# Patient Record
Sex: Male | Born: 1940
Health system: Southern US, Community
[De-identification: ages and names within clinical notes are randomized; demographics above are authoritative.]

## PROBLEM LIST (undated history)

## (undated) DIAGNOSIS — I10 Essential (primary) hypertension: Secondary | ICD-10-CM

## (undated) DIAGNOSIS — J449 Chronic obstructive pulmonary disease, unspecified: Secondary | ICD-10-CM

---

## 2017-01-10 DIAGNOSIS — E038 Other specified hypothyroidism: Secondary | ICD-10-CM | POA: Diagnosis not present

## 2017-01-10 DIAGNOSIS — N400 Enlarged prostate without lower urinary tract symptoms: Secondary | ICD-10-CM | POA: Diagnosis not present

## 2017-01-10 DIAGNOSIS — Z0000 Encounter for general adult medical examination without abnormal findings: Secondary | ICD-10-CM | POA: Diagnosis not present

## 2017-01-10 DIAGNOSIS — E559 Vitamin D deficiency, unspecified: Secondary | ICD-10-CM | POA: Diagnosis not present

## 2017-01-10 DIAGNOSIS — E782 Mixed hyperlipidemia: Secondary | ICD-10-CM | POA: Diagnosis not present

## 2017-01-10 DIAGNOSIS — E119 Type 2 diabetes mellitus without complications: Secondary | ICD-10-CM | POA: Diagnosis not present

## 2017-01-10 DIAGNOSIS — I10 Essential (primary) hypertension: Secondary | ICD-10-CM | POA: Diagnosis not present

## 2017-01-10 DIAGNOSIS — Z79899 Other long term (current) drug therapy: Secondary | ICD-10-CM | POA: Diagnosis not present

## 2017-01-10 DIAGNOSIS — D518 Other vitamin B12 deficiency anemias: Secondary | ICD-10-CM | POA: Diagnosis not present

## 2017-01-10 DIAGNOSIS — J449 Chronic obstructive pulmonary disease, unspecified: Secondary | ICD-10-CM | POA: Diagnosis not present

## 2017-01-10 DIAGNOSIS — N509 Disorder of male genital organs, unspecified: Secondary | ICD-10-CM | POA: Diagnosis not present

## 2017-01-10 DIAGNOSIS — R011 Cardiac murmur, unspecified: Secondary | ICD-10-CM | POA: Diagnosis not present

## 2017-02-07 DIAGNOSIS — I10 Essential (primary) hypertension: Secondary | ICD-10-CM | POA: Diagnosis not present

## 2017-02-07 DIAGNOSIS — H25811 Combined forms of age-related cataract, right eye: Secondary | ICD-10-CM | POA: Diagnosis not present

## 2017-02-07 DIAGNOSIS — N400 Enlarged prostate without lower urinary tract symptoms: Secondary | ICD-10-CM | POA: Diagnosis not present

## 2017-02-07 DIAGNOSIS — H353131 Nonexudative age-related macular degeneration, bilateral, early dry stage: Secondary | ICD-10-CM | POA: Diagnosis not present

## 2017-02-07 DIAGNOSIS — J449 Chronic obstructive pulmonary disease, unspecified: Secondary | ICD-10-CM | POA: Diagnosis not present

## 2017-02-07 DIAGNOSIS — D518 Other vitamin B12 deficiency anemias: Secondary | ICD-10-CM | POA: Diagnosis not present

## 2017-03-07 DIAGNOSIS — Z0000 Encounter for general adult medical examination without abnormal findings: Secondary | ICD-10-CM | POA: Diagnosis not present

## 2017-03-07 DIAGNOSIS — D518 Other vitamin B12 deficiency anemias: Secondary | ICD-10-CM | POA: Diagnosis not present

## 2017-03-07 DIAGNOSIS — I10 Essential (primary) hypertension: Secondary | ICD-10-CM | POA: Diagnosis not present

## 2017-03-07 DIAGNOSIS — Z1211 Encounter for screening for malignant neoplasm of colon: Secondary | ICD-10-CM | POA: Diagnosis not present

## 2017-03-07 DIAGNOSIS — J449 Chronic obstructive pulmonary disease, unspecified: Secondary | ICD-10-CM | POA: Diagnosis not present

## 2017-03-07 DIAGNOSIS — N400 Enlarged prostate without lower urinary tract symptoms: Secondary | ICD-10-CM | POA: Diagnosis not present

## 2017-03-08 DIAGNOSIS — I10 Essential (primary) hypertension: Secondary | ICD-10-CM | POA: Diagnosis not present

## 2017-03-08 DIAGNOSIS — J449 Chronic obstructive pulmonary disease, unspecified: Secondary | ICD-10-CM | POA: Diagnosis not present

## 2017-03-08 DIAGNOSIS — D518 Other vitamin B12 deficiency anemias: Secondary | ICD-10-CM | POA: Diagnosis not present

## 2017-03-08 DIAGNOSIS — N400 Enlarged prostate without lower urinary tract symptoms: Secondary | ICD-10-CM | POA: Diagnosis not present

## 2017-03-29 DIAGNOSIS — I10 Essential (primary) hypertension: Secondary | ICD-10-CM | POA: Diagnosis not present

## 2017-03-29 DIAGNOSIS — J449 Chronic obstructive pulmonary disease, unspecified: Secondary | ICD-10-CM | POA: Diagnosis not present

## 2017-03-29 DIAGNOSIS — N400 Enlarged prostate without lower urinary tract symptoms: Secondary | ICD-10-CM | POA: Diagnosis not present

## 2017-03-29 DIAGNOSIS — D518 Other vitamin B12 deficiency anemias: Secondary | ICD-10-CM | POA: Diagnosis not present

## 2017-05-02 DIAGNOSIS — I10 Essential (primary) hypertension: Secondary | ICD-10-CM | POA: Diagnosis not present

## 2017-05-02 DIAGNOSIS — J449 Chronic obstructive pulmonary disease, unspecified: Secondary | ICD-10-CM | POA: Diagnosis not present

## 2017-05-02 DIAGNOSIS — N400 Enlarged prostate without lower urinary tract symptoms: Secondary | ICD-10-CM | POA: Diagnosis not present

## 2017-05-02 DIAGNOSIS — D518 Other vitamin B12 deficiency anemias: Secondary | ICD-10-CM | POA: Diagnosis not present

## 2017-06-04 DIAGNOSIS — D518 Other vitamin B12 deficiency anemias: Secondary | ICD-10-CM | POA: Diagnosis not present

## 2017-06-04 DIAGNOSIS — N400 Enlarged prostate without lower urinary tract symptoms: Secondary | ICD-10-CM | POA: Diagnosis not present

## 2017-06-04 DIAGNOSIS — J449 Chronic obstructive pulmonary disease, unspecified: Secondary | ICD-10-CM | POA: Diagnosis not present

## 2017-06-04 DIAGNOSIS — I10 Essential (primary) hypertension: Secondary | ICD-10-CM | POA: Diagnosis not present

## 2017-07-13 DIAGNOSIS — H269 Unspecified cataract: Secondary | ICD-10-CM | POA: Diagnosis not present

## 2017-07-13 DIAGNOSIS — H353133 Nonexudative age-related macular degeneration, bilateral, advanced atrophic without subfoveal involvement: Secondary | ICD-10-CM | POA: Diagnosis not present

## 2017-07-13 DIAGNOSIS — H40033 Anatomical narrow angle, bilateral: Secondary | ICD-10-CM | POA: Diagnosis not present

## 2017-07-24 DIAGNOSIS — H269 Unspecified cataract: Secondary | ICD-10-CM | POA: Diagnosis not present

## 2017-07-24 DIAGNOSIS — H25811 Combined forms of age-related cataract, right eye: Secondary | ICD-10-CM | POA: Diagnosis not present

## 2017-07-24 DIAGNOSIS — H2511 Age-related nuclear cataract, right eye: Secondary | ICD-10-CM | POA: Diagnosis not present

## 2017-08-02 DIAGNOSIS — J449 Chronic obstructive pulmonary disease, unspecified: Secondary | ICD-10-CM | POA: Diagnosis not present

## 2017-08-02 DIAGNOSIS — I10 Essential (primary) hypertension: Secondary | ICD-10-CM | POA: Diagnosis not present

## 2017-08-02 DIAGNOSIS — D518 Other vitamin B12 deficiency anemias: Secondary | ICD-10-CM | POA: Diagnosis not present

## 2017-08-02 DIAGNOSIS — N400 Enlarged prostate without lower urinary tract symptoms: Secondary | ICD-10-CM | POA: Diagnosis not present

## 2017-09-04 DIAGNOSIS — J449 Chronic obstructive pulmonary disease, unspecified: Secondary | ICD-10-CM | POA: Diagnosis not present

## 2017-09-04 DIAGNOSIS — I10 Essential (primary) hypertension: Secondary | ICD-10-CM | POA: Diagnosis not present

## 2017-09-04 DIAGNOSIS — D518 Other vitamin B12 deficiency anemias: Secondary | ICD-10-CM | POA: Diagnosis not present

## 2017-09-04 DIAGNOSIS — N400 Enlarged prostate without lower urinary tract symptoms: Secondary | ICD-10-CM | POA: Diagnosis not present

## 2017-10-02 DIAGNOSIS — N400 Enlarged prostate without lower urinary tract symptoms: Secondary | ICD-10-CM | POA: Diagnosis not present

## 2017-10-02 DIAGNOSIS — D518 Other vitamin B12 deficiency anemias: Secondary | ICD-10-CM | POA: Diagnosis not present

## 2017-10-02 DIAGNOSIS — I10 Essential (primary) hypertension: Secondary | ICD-10-CM | POA: Diagnosis not present

## 2017-10-02 DIAGNOSIS — J449 Chronic obstructive pulmonary disease, unspecified: Secondary | ICD-10-CM | POA: Diagnosis not present

## 2018-01-09 DIAGNOSIS — I10 Essential (primary) hypertension: Secondary | ICD-10-CM | POA: Diagnosis not present

## 2018-01-09 DIAGNOSIS — J449 Chronic obstructive pulmonary disease, unspecified: Secondary | ICD-10-CM | POA: Diagnosis not present

## 2018-01-09 DIAGNOSIS — D518 Other vitamin B12 deficiency anemias: Secondary | ICD-10-CM | POA: Diagnosis not present

## 2018-01-09 DIAGNOSIS — N400 Enlarged prostate without lower urinary tract symptoms: Secondary | ICD-10-CM | POA: Diagnosis not present

## 2018-02-04 DIAGNOSIS — N400 Enlarged prostate without lower urinary tract symptoms: Secondary | ICD-10-CM | POA: Diagnosis not present

## 2018-02-04 DIAGNOSIS — I10 Essential (primary) hypertension: Secondary | ICD-10-CM | POA: Diagnosis not present

## 2018-02-04 DIAGNOSIS — J449 Chronic obstructive pulmonary disease, unspecified: Secondary | ICD-10-CM | POA: Diagnosis not present

## 2018-02-04 DIAGNOSIS — D518 Other vitamin B12 deficiency anemias: Secondary | ICD-10-CM | POA: Diagnosis not present

## 2018-03-06 DIAGNOSIS — D518 Other vitamin B12 deficiency anemias: Secondary | ICD-10-CM | POA: Diagnosis not present

## 2018-03-06 DIAGNOSIS — N400 Enlarged prostate without lower urinary tract symptoms: Secondary | ICD-10-CM | POA: Diagnosis not present

## 2018-03-06 DIAGNOSIS — I10 Essential (primary) hypertension: Secondary | ICD-10-CM | POA: Diagnosis not present

## 2018-03-06 DIAGNOSIS — J449 Chronic obstructive pulmonary disease, unspecified: Secondary | ICD-10-CM | POA: Diagnosis not present

## 2018-04-04 DIAGNOSIS — G5620 Lesion of ulnar nerve, unspecified upper limb: Secondary | ICD-10-CM | POA: Diagnosis not present

## 2018-04-04 DIAGNOSIS — Z9181 History of falling: Secondary | ICD-10-CM | POA: Diagnosis not present

## 2018-04-04 DIAGNOSIS — I10 Essential (primary) hypertension: Secondary | ICD-10-CM | POA: Diagnosis not present

## 2018-04-04 DIAGNOSIS — Z6827 Body mass index (BMI) 27.0-27.9, adult: Secondary | ICD-10-CM | POA: Diagnosis not present

## 2018-04-04 DIAGNOSIS — R5382 Chronic fatigue, unspecified: Secondary | ICD-10-CM | POA: Diagnosis not present

## 2018-04-12 DIAGNOSIS — Z6827 Body mass index (BMI) 27.0-27.9, adult: Secondary | ICD-10-CM | POA: Diagnosis not present

## 2018-04-12 DIAGNOSIS — I10 Essential (primary) hypertension: Secondary | ICD-10-CM | POA: Diagnosis not present

## 2018-04-12 DIAGNOSIS — R5382 Chronic fatigue, unspecified: Secondary | ICD-10-CM | POA: Diagnosis not present

## 2018-04-12 DIAGNOSIS — G5620 Lesion of ulnar nerve, unspecified upper limb: Secondary | ICD-10-CM | POA: Diagnosis not present

## 2018-04-26 DIAGNOSIS — Z6827 Body mass index (BMI) 27.0-27.9, adult: Secondary | ICD-10-CM | POA: Diagnosis not present

## 2018-04-26 DIAGNOSIS — R5382 Chronic fatigue, unspecified: Secondary | ICD-10-CM | POA: Diagnosis not present

## 2018-04-26 DIAGNOSIS — G5620 Lesion of ulnar nerve, unspecified upper limb: Secondary | ICD-10-CM | POA: Diagnosis not present

## 2018-04-26 DIAGNOSIS — I10 Essential (primary) hypertension: Secondary | ICD-10-CM | POA: Diagnosis not present

## 2018-05-10 DIAGNOSIS — E785 Hyperlipidemia, unspecified: Secondary | ICD-10-CM | POA: Diagnosis not present

## 2018-05-10 DIAGNOSIS — I10 Essential (primary) hypertension: Secondary | ICD-10-CM | POA: Diagnosis not present

## 2018-05-10 DIAGNOSIS — G5620 Lesion of ulnar nerve, unspecified upper limb: Secondary | ICD-10-CM | POA: Diagnosis not present

## 2018-05-10 DIAGNOSIS — Z6826 Body mass index (BMI) 26.0-26.9, adult: Secondary | ICD-10-CM | POA: Diagnosis not present

## 2018-05-10 DIAGNOSIS — R5382 Chronic fatigue, unspecified: Secondary | ICD-10-CM | POA: Diagnosis not present

## 2018-06-07 DIAGNOSIS — G5620 Lesion of ulnar nerve, unspecified upper limb: Secondary | ICD-10-CM | POA: Diagnosis not present

## 2018-06-07 DIAGNOSIS — I10 Essential (primary) hypertension: Secondary | ICD-10-CM | POA: Diagnosis not present

## 2018-06-07 DIAGNOSIS — Z6826 Body mass index (BMI) 26.0-26.9, adult: Secondary | ICD-10-CM | POA: Diagnosis not present

## 2018-06-07 DIAGNOSIS — E785 Hyperlipidemia, unspecified: Secondary | ICD-10-CM | POA: Diagnosis not present

## 2018-06-07 DIAGNOSIS — R5382 Chronic fatigue, unspecified: Secondary | ICD-10-CM | POA: Diagnosis not present

## 2018-07-05 DIAGNOSIS — E785 Hyperlipidemia, unspecified: Secondary | ICD-10-CM | POA: Diagnosis not present

## 2018-07-05 DIAGNOSIS — G5620 Lesion of ulnar nerve, unspecified upper limb: Secondary | ICD-10-CM | POA: Diagnosis not present

## 2018-07-05 DIAGNOSIS — R5382 Chronic fatigue, unspecified: Secondary | ICD-10-CM | POA: Diagnosis not present

## 2018-07-05 DIAGNOSIS — Z6826 Body mass index (BMI) 26.0-26.9, adult: Secondary | ICD-10-CM | POA: Diagnosis not present

## 2018-07-05 DIAGNOSIS — I10 Essential (primary) hypertension: Secondary | ICD-10-CM | POA: Diagnosis not present

## 2018-08-06 DIAGNOSIS — Z125 Encounter for screening for malignant neoplasm of prostate: Secondary | ICD-10-CM | POA: Diagnosis not present

## 2018-08-06 DIAGNOSIS — Z0000 Encounter for general adult medical examination without abnormal findings: Secondary | ICD-10-CM | POA: Diagnosis not present

## 2018-08-06 DIAGNOSIS — Z6827 Body mass index (BMI) 27.0-27.9, adult: Secondary | ICD-10-CM | POA: Diagnosis not present

## 2018-08-06 DIAGNOSIS — I10 Essential (primary) hypertension: Secondary | ICD-10-CM | POA: Diagnosis not present

## 2018-08-06 DIAGNOSIS — G5620 Lesion of ulnar nerve, unspecified upper limb: Secondary | ICD-10-CM | POA: Diagnosis not present

## 2018-08-06 DIAGNOSIS — R5382 Chronic fatigue, unspecified: Secondary | ICD-10-CM | POA: Diagnosis not present

## 2018-08-06 DIAGNOSIS — Z1339 Encounter for screening examination for other mental health and behavioral disorders: Secondary | ICD-10-CM | POA: Diagnosis not present

## 2018-08-06 DIAGNOSIS — Z9181 History of falling: Secondary | ICD-10-CM | POA: Diagnosis not present

## 2018-08-06 DIAGNOSIS — E785 Hyperlipidemia, unspecified: Secondary | ICD-10-CM | POA: Diagnosis not present

## 2018-08-06 DIAGNOSIS — Z139 Encounter for screening, unspecified: Secondary | ICD-10-CM | POA: Diagnosis not present

## 2018-09-06 DIAGNOSIS — E785 Hyperlipidemia, unspecified: Secondary | ICD-10-CM | POA: Diagnosis not present

## 2018-09-06 DIAGNOSIS — I10 Essential (primary) hypertension: Secondary | ICD-10-CM | POA: Diagnosis not present

## 2018-09-06 DIAGNOSIS — Z6827 Body mass index (BMI) 27.0-27.9, adult: Secondary | ICD-10-CM | POA: Diagnosis not present

## 2018-09-06 DIAGNOSIS — G5620 Lesion of ulnar nerve, unspecified upper limb: Secondary | ICD-10-CM | POA: Diagnosis not present

## 2018-09-06 DIAGNOSIS — R5382 Chronic fatigue, unspecified: Secondary | ICD-10-CM | POA: Diagnosis not present

## 2018-10-07 DIAGNOSIS — R5382 Chronic fatigue, unspecified: Secondary | ICD-10-CM | POA: Diagnosis not present

## 2018-10-07 DIAGNOSIS — G5620 Lesion of ulnar nerve, unspecified upper limb: Secondary | ICD-10-CM | POA: Diagnosis not present

## 2018-10-07 DIAGNOSIS — E785 Hyperlipidemia, unspecified: Secondary | ICD-10-CM | POA: Diagnosis not present

## 2018-10-07 DIAGNOSIS — E663 Overweight: Secondary | ICD-10-CM | POA: Diagnosis not present

## 2018-10-07 DIAGNOSIS — Z6828 Body mass index (BMI) 28.0-28.9, adult: Secondary | ICD-10-CM | POA: Diagnosis not present

## 2018-11-20 DIAGNOSIS — R5382 Chronic fatigue, unspecified: Secondary | ICD-10-CM | POA: Diagnosis not present

## 2018-11-20 DIAGNOSIS — E663 Overweight: Secondary | ICD-10-CM | POA: Diagnosis not present

## 2018-11-20 DIAGNOSIS — G5620 Lesion of ulnar nerve, unspecified upper limb: Secondary | ICD-10-CM | POA: Diagnosis not present

## 2018-11-20 DIAGNOSIS — E785 Hyperlipidemia, unspecified: Secondary | ICD-10-CM | POA: Diagnosis not present

## 2018-11-20 DIAGNOSIS — I10 Essential (primary) hypertension: Secondary | ICD-10-CM | POA: Diagnosis not present

## 2018-11-20 DIAGNOSIS — Z6827 Body mass index (BMI) 27.0-27.9, adult: Secondary | ICD-10-CM | POA: Diagnosis not present

## 2018-12-20 DIAGNOSIS — I10 Essential (primary) hypertension: Secondary | ICD-10-CM | POA: Diagnosis not present

## 2018-12-20 DIAGNOSIS — J449 Chronic obstructive pulmonary disease, unspecified: Secondary | ICD-10-CM | POA: Diagnosis not present

## 2018-12-23 DIAGNOSIS — R5382 Chronic fatigue, unspecified: Secondary | ICD-10-CM | POA: Diagnosis not present

## 2018-12-23 DIAGNOSIS — G5620 Lesion of ulnar nerve, unspecified upper limb: Secondary | ICD-10-CM | POA: Diagnosis not present

## 2018-12-23 DIAGNOSIS — I10 Essential (primary) hypertension: Secondary | ICD-10-CM | POA: Diagnosis not present

## 2018-12-23 DIAGNOSIS — E785 Hyperlipidemia, unspecified: Secondary | ICD-10-CM | POA: Diagnosis not present

## 2018-12-30 DIAGNOSIS — E785 Hyperlipidemia, unspecified: Secondary | ICD-10-CM | POA: Diagnosis not present

## 2018-12-30 DIAGNOSIS — G5620 Lesion of ulnar nerve, unspecified upper limb: Secondary | ICD-10-CM | POA: Diagnosis not present

## 2018-12-30 DIAGNOSIS — R5382 Chronic fatigue, unspecified: Secondary | ICD-10-CM | POA: Diagnosis not present

## 2018-12-30 DIAGNOSIS — I10 Essential (primary) hypertension: Secondary | ICD-10-CM | POA: Diagnosis not present

## 2019-01-08 DIAGNOSIS — H2703 Aphakia, bilateral: Secondary | ICD-10-CM | POA: Diagnosis not present

## 2019-01-27 DIAGNOSIS — J028 Acute pharyngitis due to other specified organisms: Secondary | ICD-10-CM | POA: Diagnosis not present

## 2019-01-27 DIAGNOSIS — R5382 Chronic fatigue, unspecified: Secondary | ICD-10-CM | POA: Diagnosis not present

## 2019-01-27 DIAGNOSIS — E785 Hyperlipidemia, unspecified: Secondary | ICD-10-CM | POA: Diagnosis not present

## 2019-01-27 DIAGNOSIS — G5620 Lesion of ulnar nerve, unspecified upper limb: Secondary | ICD-10-CM | POA: Diagnosis not present

## 2019-01-27 DIAGNOSIS — I10 Essential (primary) hypertension: Secondary | ICD-10-CM | POA: Diagnosis not present

## 2019-02-25 DIAGNOSIS — E785 Hyperlipidemia, unspecified: Secondary | ICD-10-CM | POA: Diagnosis not present

## 2019-02-25 DIAGNOSIS — I10 Essential (primary) hypertension: Secondary | ICD-10-CM | POA: Diagnosis not present

## 2019-02-25 DIAGNOSIS — G5620 Lesion of ulnar nerve, unspecified upper limb: Secondary | ICD-10-CM | POA: Diagnosis not present

## 2019-02-25 DIAGNOSIS — R5382 Chronic fatigue, unspecified: Secondary | ICD-10-CM | POA: Diagnosis not present

## 2019-03-05 DIAGNOSIS — I10 Essential (primary) hypertension: Secondary | ICD-10-CM | POA: Diagnosis not present

## 2019-03-05 DIAGNOSIS — E785 Hyperlipidemia, unspecified: Secondary | ICD-10-CM | POA: Diagnosis not present

## 2019-03-05 DIAGNOSIS — G5620 Lesion of ulnar nerve, unspecified upper limb: Secondary | ICD-10-CM | POA: Diagnosis not present

## 2019-03-05 DIAGNOSIS — R5382 Chronic fatigue, unspecified: Secondary | ICD-10-CM | POA: Diagnosis not present

## 2019-03-31 DIAGNOSIS — G5620 Lesion of ulnar nerve, unspecified upper limb: Secondary | ICD-10-CM | POA: Diagnosis not present

## 2019-03-31 DIAGNOSIS — E785 Hyperlipidemia, unspecified: Secondary | ICD-10-CM | POA: Diagnosis not present

## 2019-03-31 DIAGNOSIS — R5382 Chronic fatigue, unspecified: Secondary | ICD-10-CM | POA: Diagnosis not present

## 2019-03-31 DIAGNOSIS — I10 Essential (primary) hypertension: Secondary | ICD-10-CM | POA: Diagnosis not present

## 2019-04-28 DIAGNOSIS — E785 Hyperlipidemia, unspecified: Secondary | ICD-10-CM | POA: Diagnosis not present

## 2019-04-28 DIAGNOSIS — G5620 Lesion of ulnar nerve, unspecified upper limb: Secondary | ICD-10-CM | POA: Diagnosis not present

## 2019-04-28 DIAGNOSIS — I10 Essential (primary) hypertension: Secondary | ICD-10-CM | POA: Diagnosis not present

## 2019-04-28 DIAGNOSIS — R5382 Chronic fatigue, unspecified: Secondary | ICD-10-CM | POA: Diagnosis not present

## 2019-05-26 DIAGNOSIS — E785 Hyperlipidemia, unspecified: Secondary | ICD-10-CM | POA: Diagnosis not present

## 2019-05-26 DIAGNOSIS — I10 Essential (primary) hypertension: Secondary | ICD-10-CM | POA: Diagnosis not present

## 2019-05-26 DIAGNOSIS — R5382 Chronic fatigue, unspecified: Secondary | ICD-10-CM | POA: Diagnosis not present

## 2019-05-26 DIAGNOSIS — G5620 Lesion of ulnar nerve, unspecified upper limb: Secondary | ICD-10-CM | POA: Diagnosis not present

## 2019-07-03 ENCOUNTER — Other Ambulatory Visit (HOSPITAL_COMMUNITY): Admit: 2019-07-03 | Discharge: 2019-07-03 | Disposition: A | Discharge status: Home / Self Care | Payer: Medicare Other

## 2019-07-03 DIAGNOSIS — Z5329 Procedure and treatment not carried out because of patient's decision for other reasons: Secondary | ICD-10-CM | POA: Diagnosis not present

## 2019-07-03 DIAGNOSIS — I10 Essential (primary) hypertension: Secondary | ICD-10-CM | POA: Diagnosis not present

## 2019-07-03 DIAGNOSIS — R0902 Hypoxemia: Secondary | ICD-10-CM | POA: Diagnosis not present

## 2019-07-03 DIAGNOSIS — J439 Emphysema, unspecified: Secondary | ICD-10-CM | POA: Diagnosis not present

## 2019-07-03 DIAGNOSIS — Z7982 Long term (current) use of aspirin: Secondary | ICD-10-CM | POA: Diagnosis not present

## 2019-07-03 DIAGNOSIS — Z87891 Personal history of nicotine dependence: Secondary | ICD-10-CM | POA: Diagnosis not present

## 2019-07-03 DIAGNOSIS — R0601 Orthopnea: Secondary | ICD-10-CM | POA: Diagnosis not present

## 2019-07-03 DIAGNOSIS — J441 Chronic obstructive pulmonary disease with (acute) exacerbation: Secondary | ICD-10-CM | POA: Diagnosis not present

## 2019-07-03 DIAGNOSIS — M1990 Unspecified osteoarthritis, unspecified site: Secondary | ICD-10-CM | POA: Diagnosis not present

## 2019-07-03 DIAGNOSIS — R600 Localized edema: Secondary | ICD-10-CM | POA: Diagnosis not present

## 2019-07-03 DIAGNOSIS — R0602 Shortness of breath: Secondary | ICD-10-CM | POA: Diagnosis not present

## 2019-07-04 DIAGNOSIS — J439 Emphysema, unspecified: Secondary | ICD-10-CM | POA: Diagnosis not present

## 2019-07-04 DIAGNOSIS — M1990 Unspecified osteoarthritis, unspecified site: Secondary | ICD-10-CM | POA: Diagnosis not present

## 2019-07-04 DIAGNOSIS — Z87891 Personal history of nicotine dependence: Secondary | ICD-10-CM | POA: Diagnosis not present

## 2019-07-04 DIAGNOSIS — R0601 Orthopnea: Secondary | ICD-10-CM | POA: Diagnosis not present

## 2019-07-04 DIAGNOSIS — I10 Essential (primary) hypertension: Secondary | ICD-10-CM | POA: Diagnosis not present

## 2019-07-04 DIAGNOSIS — R0602 Shortness of breath: Secondary | ICD-10-CM | POA: Diagnosis not present

## 2019-07-04 DIAGNOSIS — R600 Localized edema: Secondary | ICD-10-CM | POA: Diagnosis not present

## 2019-07-04 DIAGNOSIS — Z7982 Long term (current) use of aspirin: Secondary | ICD-10-CM | POA: Diagnosis not present

## 2019-07-04 DIAGNOSIS — R0902 Hypoxemia: Secondary | ICD-10-CM | POA: Diagnosis not present

## 2019-07-04 DIAGNOSIS — J441 Chronic obstructive pulmonary disease with (acute) exacerbation: Secondary | ICD-10-CM | POA: Diagnosis not present

## 2019-07-04 DIAGNOSIS — Z5329 Procedure and treatment not carried out because of patient's decision for other reasons: Secondary | ICD-10-CM | POA: Diagnosis not present

## 2019-07-04 LAB — SARS CORONAVIRUS 2 BY RT PCR (HOSPITAL ORDER, PERFORMED IN ~~LOC~~ HOSPITAL LAB): SARS Coronavirus 2: NEGATIVE

## 2019-07-14 ENCOUNTER — Encounter (HOSPITAL_COMMUNITY): Payer: Self-pay

## 2019-07-14 ENCOUNTER — Other Ambulatory Visit: Payer: Self-pay

## 2019-07-14 ENCOUNTER — Emergency Department (HOSPITAL_COMMUNITY)
Admission: EM | Admit: 2019-07-14 | Discharge: 2019-07-14 | Disposition: A | Discharge status: Home / Self Care | Payer: Medicare Other | Attending: Emergency Medicine | Admitting: Emergency Medicine

## 2019-07-14 ENCOUNTER — Emergency Department (HOSPITAL_COMMUNITY): Payer: Medicare Other

## 2019-07-14 DIAGNOSIS — Z87891 Personal history of nicotine dependence: Secondary | ICD-10-CM | POA: Diagnosis not present

## 2019-07-14 DIAGNOSIS — I872 Venous insufficiency (chronic) (peripheral): Secondary | ICD-10-CM | POA: Diagnosis not present

## 2019-07-14 DIAGNOSIS — D649 Anemia, unspecified: Secondary | ICD-10-CM

## 2019-07-14 DIAGNOSIS — R600 Localized edema: Secondary | ICD-10-CM | POA: Diagnosis not present

## 2019-07-14 DIAGNOSIS — I10 Essential (primary) hypertension: Secondary | ICD-10-CM | POA: Insufficient documentation

## 2019-07-14 DIAGNOSIS — R609 Edema, unspecified: Secondary | ICD-10-CM

## 2019-07-14 DIAGNOSIS — R0602 Shortness of breath: Secondary | ICD-10-CM | POA: Insufficient documentation

## 2019-07-14 DIAGNOSIS — R2243 Localized swelling, mass and lump, lower limb, bilateral: Secondary | ICD-10-CM | POA: Insufficient documentation

## 2019-07-14 DIAGNOSIS — J449 Chronic obstructive pulmonary disease, unspecified: Secondary | ICD-10-CM | POA: Diagnosis not present

## 2019-07-14 HISTORY — DX: Chronic obstructive pulmonary disease, unspecified: J44.9

## 2019-07-14 HISTORY — DX: Essential (primary) hypertension: I10

## 2019-07-14 LAB — BASIC METABOLIC PANEL
Anion gap: 8 (ref 5–15)
BUN: 8 mg/dL (ref 8–23)
CO2: 32 mmol/L (ref 22–32)
Calcium: 9.2 mg/dL (ref 8.9–10.3)
Chloride: 100 mmol/L (ref 98–111)
Creatinine, Ser: 0.97 mg/dL (ref 0.61–1.24)
GFR calc Af Amer: >60 mL/min (ref >60)
GFR calc non Af Amer: >60 mL/min (ref >60)
Glucose, Bld: 103 mg/dL — ABNORMAL HIGH (ref 70–99)
Potassium: 4.2 mmol/L (ref 3.5–5.1)
Sodium: 140 mmol/L (ref 135–145)

## 2019-07-14 LAB — CBC
HCT: 40.5 % (ref 39.0–52.0)
Hemoglobin: 12.3 g/dL — ABNORMAL LOW (ref 13.0–17.0)
MCH: 29.1 pg (ref 26.0–34.0)
MCHC: 30.4 g/dL (ref 30.0–36.0)
MCV: 95.7 fL (ref 80.0–100.0)
Platelets: 197 10*3/uL (ref 150–400)
RBC: 4.23 MIL/uL (ref 4.22–5.81)
RDW: 14.7 % (ref 11.5–15.5)
WBC: 6.2 10*3/uL (ref 4.0–10.5)
nRBC: 0 % (ref 0.0–0.2)

## 2019-07-14 LAB — TROPONIN I (HIGH SENSITIVITY): Troponin I (High Sensitivity): 7 ng/L (ref <18)

## 2019-07-14 LAB — BRAIN NATRIURETIC PEPTIDE: B Natriuretic Peptide: 32.5 pg/mL (ref 0.0–100.0)

## 2019-07-14 MED ORDER — FUROSEMIDE 40 MG PO TABS: 40.0000 mg | ORAL_TABLET | Freq: Every day | ORAL | 0 refills | Status: DC

## 2019-07-14 MED ORDER — FUROSEMIDE 20 MG PO TABS
40.0000 mg | ORAL_TABLET | Freq: Once | ORAL | Status: AC
  Administered 2019-07-14: 40 mg via ORAL
  Filled 2019-07-14: qty 2

## 2019-07-14 NOTE — ED Provider Notes (Signed)
[facility] Provider Note   CSN: [id number] Arrival date & time: 07/14/19  1755    History   Chief Complaint Chief Complaint  Patient presents with  . Leg Swelling  . Shortness of Breath    HPI Aaron Daniels is a 78 y.o. male.    The history is provided by the patient.  He has history of COPD and hypertension and comes in because of progressive leg swelling over the last 3 weeks.  [place] had been [place] in [place] but left AMA.  He states that they gave him some medicine and he peed out at least 3 gallons.  He does note some exertional dyspnea which has been going on for several months.  He denies chest pain, heaviness, tightness, pressure.  He denies nausea or vomiting.  He does admit to having large salt intake.  Past Medical History:  Diagnosis Date  . COPD (chronic obstructive pulmonary disease) (HCC)   . Hypertension     There are no active problems to display for this patient.   History reviewed. No pertinent surgical history.      Home Medications    Prior to Admission medications   Not on File    Family History History reviewed. No pertinent family history.  Social History Social History   Tobacco Use  . Smoking status: Former Smoker    Quit date: 07/13/2012    Years since quitting: 7.0  Substance Use Topics  . Alcohol use: Never    Frequency: Never  . Drug use: Never     Allergies   Codeine, Penicillins, and Sulfa antibiotics   Review of Systems Review of Systems  All other systems reviewed and are negative.    Physical Exam Updated Vital Signs BP (!) 150/95   Pulse 87   Temp 98.4 F (36.9 C) (Oral)   Resp 19   SpO2 94%   Physical Exam Vitals signs and nursing note reviewed.    78 year old male, resting comfortably and in no acute distress. Vital signs are significant for elevated blood pressure. Oxygen saturation is 94%, which is normal. Head is normocephalic and atraumatic. PERRLA,  EOMI. Oropharynx is clear. Neck is nontender and supple without adenopathy or JVD. Back is nontender and there is no CVA tenderness. Lungs have few bibasilar rales without wheezes or rhonchi. Chest is nontender. Heart has regular rate and rhythm without murmur. Abdomen is soft, flat, nontender without masses or hepatosplenomegaly and peristalsis is normoactive. Extremities have 2-3+ edema, full range of motion is present.  Moderate venous stasis changes are present bilaterally. Skin is warm and dry without rash. Neurologic: Mental status is normal, cranial nerves are intact, there are no motor or sensory deficits.  ED Treatments / Results  Labs (all labs ordered are listed, but only abnormal results are displayed) Labs Reviewed  BASIC METABOLIC PANEL - Abnormal; Notable for the following components:      Result Value   Glucose, Bld 103 (*)    All other components within normal limits  CBC - Abnormal; Notable for the following components:   Hemoglobin 12.3 (*)    All other components within normal limits  BRAIN NATRIURETIC PEPTIDE  TROPONIN I (HIGH SENSITIVITY)    EKG EKG Interpretation  Date/Time:  Monday July 14 2019 18:15:20 EDT Ventricular Rate:  86 PR Interval:  152 QRS Duration: 74 QT Interval:  362 QTC Calculation: 433 R Axis:   -47 Text Interpretation:  Normal sinus rhythm Left anterior fascicular  block Cannot rule out Inferior infarct (masked by fascicular block?) , age undetermined Abnormal ECG No old tracing to compare Confirmed by Delora Fuel ([id number]) on 07/14/2019 10:55:50 PM   Radiology Dg Chest 2 View  Result Date: 07/14/2019 CLINICAL DATA:  Shortness of breath for 2 weeks EXAM: CHEST - 2 VIEW COMPARISON:  07/03/2019 FINDINGS: Cardiac shadow is stable. The lungs are mildly hyperaerated bilaterally. No focal infiltrate or sizable effusion is seen. Mild chronic appearing interstitial changes are again identified and stable. No acute bony abnormality is noted.  IMPRESSION: COPD without acute abnormality Electronically Signed   By: Inez Catalina M.D.   On: 07/14/2019 19:01    Procedures Procedures  Medications Ordered in ED Medications  furosemide (LASIX) tablet 40 mg (has no administration in time range)     Initial Impression / Assessment and Plan / ED Course  I have reviewed the triage vital signs and the nursing notes.  Pertinent labs & imaging results that were available during my care of the patient were reviewed by me and considered in my medical decision making (see chart for details).  Peripheral edema with venous stasis changes.  History of COPD.  ECG shows left anterior fascicular block without any prior ECGs available for comparison.  Chest x-ray shows changes of COPD, no changes suggestive of heart failure.  Labs are significant for mild anemia.  Old records are reviewed, and the only thing on record is an urgent care visit 1 week ago for similar complaints and he was referred to the hospital at that time.  He is currently maintaining adequate oxygen saturation and does not need inpatient care.  He is discharged with a prescription for furosemide, advised to stay on a low-salt diet and to weigh himself daily.  He has a an appointment with a new PCP scheduled for August 20 and he is to keep that appointment.  Return precautions discussed.  Final Clinical Impressions(s) / ED Diagnoses   Final diagnoses:  None    ED Discharge Orders    None       Delora Fuel, MD 51/76/16 2321

## 2019-07-14 NOTE — Discharge Instructions (Signed)
Weigh yourself every morning.  Stay on a low salt diet.  Return if you are having any problems.

## 2019-07-14 NOTE — ED Triage Notes (Signed)
Pt endorses generalized swelling with shob x 2 weeks, recently diagnosed with copd. Axox4. Denies CP.

## 2019-08-13 ENCOUNTER — Emergency Department (HOSPITAL_COMMUNITY)
Admission: EM | Admit: 2019-08-13 | Discharge: 2019-08-14 | Disposition: A | Discharge status: Home / Self Care | Payer: Medicare Other | Attending: Emergency Medicine | Admitting: Emergency Medicine

## 2019-08-13 ENCOUNTER — Other Ambulatory Visit: Payer: Self-pay

## 2019-08-13 DIAGNOSIS — I10 Essential (primary) hypertension: Secondary | ICD-10-CM | POA: Diagnosis not present

## 2019-08-13 DIAGNOSIS — J441 Chronic obstructive pulmonary disease with (acute) exacerbation: Secondary | ICD-10-CM | POA: Diagnosis not present

## 2019-08-13 DIAGNOSIS — R062 Wheezing: Secondary | ICD-10-CM | POA: Diagnosis not present

## 2019-08-13 DIAGNOSIS — Z79899 Other long term (current) drug therapy: Secondary | ICD-10-CM | POA: Diagnosis not present

## 2019-08-13 DIAGNOSIS — R0602 Shortness of breath: Secondary | ICD-10-CM | POA: Diagnosis not present

## 2019-08-13 DIAGNOSIS — R600 Localized edema: Secondary | ICD-10-CM | POA: Insufficient documentation

## 2019-08-13 DIAGNOSIS — Z87891 Personal history of nicotine dependence: Secondary | ICD-10-CM | POA: Insufficient documentation

## 2019-08-13 DIAGNOSIS — J42 Unspecified chronic bronchitis: Secondary | ICD-10-CM | POA: Diagnosis not present

## 2019-08-13 LAB — CBC
HCT: 41.6 % (ref 39.0–52.0)
Hemoglobin: 12.5 g/dL — ABNORMAL LOW (ref 13.0–17.0)
MCH: 29 pg (ref 26.0–34.0)
MCHC: 30 g/dL (ref 30.0–36.0)
MCV: 96.5 fL (ref 80.0–100.0)
Platelets: 213 10*3/uL (ref 150–400)
RBC: 4.31 MIL/uL (ref 4.22–5.81)
RDW: 14.5 % (ref 11.5–15.5)
WBC: 6 10*3/uL (ref 4.0–10.5)
nRBC: 0 % (ref 0.0–0.2)

## 2019-08-13 LAB — BASIC METABOLIC PANEL
Anion gap: 10 (ref 5–15)
BUN: 16 mg/dL (ref 8–23)
CO2: 32 mmol/L (ref 22–32)
Calcium: 8.9 mg/dL (ref 8.9–10.3)
Chloride: 97 mmol/L — ABNORMAL LOW (ref 98–111)
Creatinine, Ser: 1 mg/dL (ref 0.61–1.24)
GFR calc Af Amer: >60 mL/min (ref >60)
GFR calc non Af Amer: >60 mL/min (ref >60)
Glucose, Bld: 131 mg/dL — ABNORMAL HIGH (ref 70–99)
Potassium: 4.1 mmol/L (ref 3.5–5.1)
Sodium: 139 mmol/L (ref 135–145)

## 2019-08-13 MED ORDER — ALBUTEROL SULFATE (2.5 MG/3ML) 0.083% IN NEBU
5.0000 mg | INHALATION_SOLUTION | Freq: Once | RESPIRATORY_TRACT | Status: AC
  Administered 2019-08-13: 5 mg via RESPIRATORY_TRACT
  Filled 2019-08-13: qty 6

## 2019-08-13 NOTE — ED Triage Notes (Signed)
Usual shortness of breath is worsening, interfering with daily activities.  Edema is worsening.  Talking in complete sentences, can hear audible wheezes.

## 2019-08-14 ENCOUNTER — Emergency Department (HOSPITAL_COMMUNITY): Payer: Medicare Other

## 2019-08-14 DIAGNOSIS — R0602 Shortness of breath: Secondary | ICD-10-CM | POA: Diagnosis not present

## 2019-08-14 LAB — BRAIN NATRIURETIC PEPTIDE: B Natriuretic Peptide: 32.1 pg/mL (ref 0.0–100.0)

## 2019-08-14 MED ORDER — FUROSEMIDE 10 MG/ML IJ SOLN
40.0000 mg | Freq: Once | INTRAMUSCULAR | Status: AC
  Administered 2019-08-14: 40 mg via INTRAVENOUS
  Filled 2019-08-14: qty 4

## 2019-08-14 MED ORDER — ALBUTEROL SULFATE HFA 108 (90 BASE) MCG/ACT IN AERS
4.0000 | INHALATION_SPRAY | RESPIRATORY_TRACT | Status: AC
  Administered 2019-08-14 (×3): 4 via RESPIRATORY_TRACT
  Filled 2019-08-14: qty 6.7

## 2019-08-14 MED ORDER — PREDNISONE 20 MG PO TABS: ORAL_TABLET | ORAL | 0 refills | Status: DC

## 2019-08-14 MED ORDER — METHYLPREDNISOLONE SODIUM SUCC 125 MG IJ SOLR
125.0000 mg | Freq: Once | INTRAMUSCULAR | Status: AC
  Administered 2019-08-14: 125 mg via INTRAVENOUS
  Filled 2019-08-14: qty 2

## 2019-08-14 NOTE — ED Notes (Signed)
Lab to at [facility] to previous lab collection

## 2019-08-14 NOTE — Discharge Instructions (Addendum)
Take prednisone as prescribed   Use albuterol every 4 hrs as needed for cough   See your doctor  Return to ER if you have worse shortness of breath, wheezing, fever, cough

## 2019-08-14 NOTE — ED Notes (Signed)
Pt O2 tank switched out for a fresh tank. Pt O2 tank was empty.

## 2019-08-14 NOTE — ED Provider Notes (Addendum)
[facility]   CSN: [id number] Arrival date & time: 08/13/19  2103     History   Chief Complaint Chief Complaint  Patient presents with   Shortness of Breath    HPI Aaron Daniels is a 78 y.o. male.     HPI  78 year old comes with a chief complaint of shortness of breath and worsening leg swelling. Patient reports that he just established a PCP today.  They advised that he come to the ER for his symptoms.  He has been having worsening shortness of breath over the past several months.  Leg swelling is also been going on for several weeks.  Patient is on Lasix.  He reports that now walking around the house or going to the mailbox gets him short of breath, which is new for him.  He does not have any known diagnosis of CHF or CAD.  He denies any chest pain.  He has a cough and is wheezing.  Cough is mostly nonproductive.  He denies any orthopnea or paroxysmal nocturnal dyspnea-like symptoms.  There is bilateral lower extremity swelling that continues to be a source of discomfort for him. Patient used to smoke 1 pack a day, but has not smoked for over 7 years now.  He is not on any inhalers at home. Past Medical History:  Diagnosis Date   COPD (chronic obstructive pulmonary disease) (HCC)    Hypertension     There are no active problems to display for this patient.   No past surgical history on file.      Home Medications    Prior to Admission medications   Medication Sig Start Date End Date Taking? Authorizing Provider  furosemide (LASIX) 40 MG tablet Take 1 tablet (40 mg total) by mouth daily. 07/14/19   Dione BoozeGlick, David, MD    Family History No family history on file.  Social History Social History   Tobacco Use   Smoking status: Former Smoker    Quit date: 07/13/2012    Years since quitting: 7.0  Substance Use Topics   Alcohol use: Never    Frequency: Never   Drug use: Never     Allergies   Codeine,  Penicillins, and Sulfa antibiotics   Review of Systems Review of Systems  Constitutional: Positive for activity change.  Respiratory: Positive for cough, shortness of breath and wheezing.   Cardiovascular: Negative for chest pain.  Gastrointestinal: Negative for nausea and vomiting.  All other systems reviewed and are negative.    Physical Exam Updated Vital Signs BP (!) 135/92    Pulse 96    Temp 98.9 F (37.2 C) (Oral)    Resp 15    Ht 5\' 11"  (1.803 m)    Wt 110.7 kg    SpO2 100%    BMI 34.03 kg/m   Physical Exam Vitals signs and nursing note reviewed.  Constitutional:      Appearance: He is well-developed.  HENT:     Head: Atraumatic.  Neck:     Musculoskeletal: Neck supple.  Cardiovascular:     Rate and Rhythm: Normal rate.  Pulmonary:     Effort: Pulmonary effort is normal.     Breath sounds: Examination of the right-upper field reveals wheezing. Examination of the left-upper field reveals wheezing. Examination of the right-middle field reveals wheezing. Examination of the left-middle field reveals wheezing. Examination of the right-lower field reveals wheezing. Examination of the left-lower field reveals wheezing. Wheezing present. No decreased breath sounds.  Musculoskeletal:     Right lower leg: Edema present.     Left lower leg: Edema present.  Skin:    General: Skin is warm.  Neurological:     Mental Status: He is alert and oriented to person, place, and time.      ED Treatments / Results  Labs (all labs ordered are listed, but only abnormal results are displayed) Labs Reviewed  BASIC METABOLIC PANEL - Abnormal; Notable for the following components:      Result Value   Chloride 97 (*)    Glucose, Bld 131 (*)    All other components within normal limits  CBC - Abnormal; Notable for the following components:   Hemoglobin 12.5 (*)    All other components within normal limits  BRAIN NATRIURETIC PEPTIDE    EKG EKG Interpretation  Date/Time:  Wednesday  August 13 2019 21:16:52 EDT Ventricular Rate:  109 PR Interval:  154 QRS Duration: 68 QT Interval:  348 QTC Calculation: 468 R Axis:   -53 Text Interpretation:  Sinus tachycardia with Premature atrial complexes with Abberant conduction Left axis deviation Low voltage QRS Inferior infarct , age undetermined Abnormal ECG Rate faster No significant change since last tracing Confirmed by Merrily Pew [phone]) on 08/14/2019 12:56:46 AM   Radiology Dg Chest 2 View  Result Date: 08/14/2019 CLINICAL DATA:  Initial evaluation for acute shortness of breath, lower extremity swelling. EXAM: CHEST - 2 VIEW COMPARISON:  Prior radiograph from 08/13/2019. FINDINGS: Cardiac and mediastinal silhouettes are stable in size and contour, and remain within normal limits. Lungs mildly hypoinflated. Streaky and linear bibasilar opacities most consistent with atelectasis. There is diffusely increased pulmonary vascular congestion with interstitial prominence as compared to previous exam, consistent with mild diffuse pulmonary interstitial edema. No consolidative opacity or focal infiltrate. No significant pleural effusion. No pneumothorax. Osseous structures unchanged. IMPRESSION: 1. Interval increase in diffuse pulmonary vascular congestion and interstitial prominence, consistent with mild diffuse pulmonary interstitial edema. 2. Superimposed streaky bibasilar atelectatic changes. Electronically Signed   By: Jeannine Boga M.D.   On: 08/14/2019 01:27    Procedures .Critical Care Performed by: Varney Biles, MD Authorized by: Varney Biles, MD   Critical care provider statement:    Critical care time (minutes):  35   Critical care was necessary to treat or prevent imminent or life-threatening deterioration of the following conditions:  Respiratory failure   Critical care was time spent personally by me on the following activities:  Discussions with consultants, evaluation of patient's response to treatment,  examination of patient, ordering and performing treatments and interventions, ordering and review of laboratory studies, ordering and review of radiographic studies, pulse oximetry, re-evaluation of patient's condition, obtaining history from patient or surrogate and review of old charts   (including critical care time)  Medications Ordered in ED Medications  albuterol (VENTOLIN HFA) 108 (90 Base) MCG/ACT inhaler 4 puff (4 puffs Inhalation Given 08/14/19 0642)  methylPREDNISolone sodium succinate (SOLU-MEDROL) 125 mg/2 mL injection 125 mg (has no administration in time range)  albuterol (PROVENTIL) (2.5 MG/3ML) 0.083% nebulizer solution 5 mg (5 mg Nebulization Given 08/13/19 2144)  furosemide (LASIX) injection 40 mg (40 mg Intravenous Given 08/14/19 5277)     Initial Impression / Assessment and Plan / ED Course  I have reviewed the triage vital signs and the nursing notes.  Pertinent labs & imaging results that were available during my care of the patient were reviewed by me and considered in my medical decision making (see chart for details).  Clinical Course as of Aug 13 712  Thu Aug 14, 2019  2951 Patient reassessed after 2 rounds of inhaler treatment.  He feels a lot better and wants to go home.  When we tried to ambulate him however, his O2 sats dropped to 85 to 86% per nursing staff.  Patient still wants to go home.  His care will be signed out to Dr. Silverio Lay.  Patient is agreed to stay back and await his BNP results and get another round of nebulizer treatment.  Upon reassessment, if patient wants to go home then he will be discharged with strict ER return precautions.  He will need to go home with steroids.  If the BNP is elevated then patient would benefit with cardiology follow-up.   [AN]    Clinical Course User Index [AN] Derwood Kaplan, MD       78 year old with history of COPD and peripheral edema comes in a chief complaint of shortness of breath and worsening leg swelling.  He  informs me that he was asked to come to the ER by his PCP that he just saw today.  At arrival patient was hypoxic.  He has peripheral edema.  On lung exam he has diffuse wheezing.  Wheezing could be cardiac or pulmonary in nature.  He has a cough, but there is no fevers and the cough is nonproductive.  Unlikely to be bacterial pneumonia.  No COVID-19 exposures per patient.  Plan is to give him inhaler treatment and reassess.  Chest x-ray shows pulmonary congestion and interstitial edema, BNP also added.  Reassessment: Patient reassessed after first round of inhaler treatment.  He feels a lot better.  He still wheezing.  Ambulatory pulse ox ordered.  Oxygen was discontinued and he was not hypoxic during my brief stay in the room after oxygen was turned off. Anticipate discharge.  I am not quite sure why PCP sent patient to the ER -perhaps are concerned that patient might have new onset CHF or concerned that he might be developing a pneumonia.  Given this lack of clarity, if patient is discharged we will give him follow-up with cardiology if the BNP is elevated.  7:13 AM Aaron Pies was evaluated in Emergency Department on 08/14/2019 for the symptoms described in the history of present illness. He was evaluated in the context of the global COVID-19 pandemic, which necessitated consideration that the patient might be at risk for infection with the SARS-CoV-2 virus that causes COVID-19. Institutional protocols and algorithms that pertain to the evaluation of patients at risk for COVID-19 are in a state of rapid change based on information released by regulatory bodies including the CDC and federal and state organizations. These policies and algorithms were followed during the patient's care in the ED.   Final Clinical Impressions(s) / ED Diagnoses   Final diagnoses:  COPD exacerbation [facility])    ED Discharge Orders    None        Derwood Kaplan, MD 08/14/19 [phone]

## 2019-08-14 NOTE — ED Notes (Signed)
Walked pt. Pt dropped to 86& RA walking to bathroom and around nurses station. HR increased to 114. Pt 90 % RA after ambulation and resting in bed.

## 2019-08-14 NOTE — ED Notes (Signed)
Pt tolerating room air at this time RR 15-16 SpO2 96%

## 2019-08-14 NOTE — ED Notes (Signed)
Patient denies pain and is resting comfortably.  

## 2019-08-14 NOTE — ED Notes (Signed)
Prior to ambulating, pt had O2 sats of 96. He ambulated with no supplemental O2 and no assistance. Pt's sats dropped to 90 while ambulating. Pt denied SOB or lightheadedness. Upon returning to bed, pt's sats returned to 98.

## 2019-08-14 NOTE — ED Provider Notes (Signed)
  Physical Exam  BP 123/77   Pulse 100   Temp 98.9 F (37.2 C) (Oral)   Resp (!) 26   Ht 5\' 11"  (1.803 m)   Wt 110.7 kg   SpO2 99%   BMI 34.03 kg/m   Physical Exam  ED Course/Procedures   Clinical Course as of Aug 13 1040  Thu Aug 14, 2019  0711 Patient reassessed after 2 rounds of inhaler treatment.  He feels a lot better and wants to go home.  When we tried to ambulate him however, his O2 sats dropped to 85 to 86% per nursing staff.  Patient still wants to go home.  His care will be signed out to Dr. Darl Householder.  Patient is agreed to stay back and await his BNP results and get another round of nebulizer treatment.  Upon reassessment, if patient wants to go home then he will be discharged with strict ER return precautions.  He will need to go home with steroids.  If the BNP is elevated then patient would benefit with cardiology follow-up.   [AN]    Clinical Course User Index [AN] Varney Biles, MD    Procedures  MDM  Care assumed at 7 AM from Dr. Kathrynn Humble.  Patient is here for shortness of breath and wheezing .  He does believe he desatted to about 85% per nursing staff with ambulation.  Signout pending steroids and reassessment.  10:41 AM BNP normal. Patient does desat when sleeping but has hx of OSA. Patient ambulated after given steroids and O2 is about 90%. He felt better and has improved air movement and minimal wheezing. Offered admission again but patient wants to go home. Will dc home with course of steroids       Drenda Freeze, MD 08/14/19 1042

## 2019-08-14 NOTE — ED Notes (Signed)
RN to walk pt after 30 post treatment per Dr. Kathrynn Humble.

## 2019-08-19 ENCOUNTER — Other Ambulatory Visit: Payer: Self-pay

## 2019-08-19 NOTE — Patient Outreach (Signed)
New referral: Source: [facility]  Frequent ED visits.  Reviewed referral and electronic medical record.  Placed call to patient and explained reason for call.  Patient is very hard of hearing and had to repeat myself many times.  Patient reports he is hoarse but feeling better.  Reports he still has some swelling but feels pretty good.  Reviewed follow up with primary MD and patient is unable to tell me who his doctor is. Reports practice is on [address].  I told patient, I would call and find out when his next follow up is planned.   Placed call to [facility] and was told that this patient was not a patient in their practice.  Placed call to [facility] and spoke with Nonie Hoyer who states records were transferred on 07/12/2019 to [place], [address]. Contact number of [phone].  Place call to [facility] [facility] and was told patient is a current patient and sees , Blase Mess PA. Was informed patient does not have a follow up appointment.   Placed call to [facility] to inquire if Blase Mess is [facility] provider and I was told no,  Placed call back to patient and inform him of his MD name and phone number. Patient reports he will go get himself an appointment.   PLAN: close case as patient is not eligible for services. In basket message sent to Verlon Setting to update MD in medical record.  Tomasa Rand, RN, BSN, CEN [facility] [employer] [phone]

## 2019-09-03 ENCOUNTER — Encounter (HOSPITAL_COMMUNITY): Payer: Self-pay

## 2019-09-03 ENCOUNTER — Other Ambulatory Visit: Payer: Self-pay

## 2019-09-03 ENCOUNTER — Emergency Department (HOSPITAL_COMMUNITY): Payer: Medicare Other

## 2019-09-03 ENCOUNTER — Inpatient Hospital Stay (HOSPITAL_COMMUNITY)
Admission: EM | Admit: 2019-09-03 | Discharge: 2019-09-08 | DRG: 189 | Disposition: A | Discharge status: Home Health | Payer: Medicare Other | Attending: Internal Medicine | Admitting: Internal Medicine

## 2019-09-03 DIAGNOSIS — E119 Type 2 diabetes mellitus without complications: Secondary | ICD-10-CM | POA: Diagnosis present

## 2019-09-03 DIAGNOSIS — R05 Cough: Secondary | ICD-10-CM | POA: Diagnosis not present

## 2019-09-03 DIAGNOSIS — Z6830 Body mass index (BMI) 30.0-30.9, adult: Secondary | ICD-10-CM | POA: Diagnosis not present

## 2019-09-03 DIAGNOSIS — Z20828 Contact with and (suspected) exposure to other viral communicable diseases: Secondary | ICD-10-CM | POA: Diagnosis present

## 2019-09-03 DIAGNOSIS — R000 Tachycardia, unspecified: Secondary | ICD-10-CM | POA: Diagnosis not present

## 2019-09-03 DIAGNOSIS — J449 Chronic obstructive pulmonary disease, unspecified: Secondary | ICD-10-CM | POA: Diagnosis not present

## 2019-09-03 DIAGNOSIS — E8770 Fluid overload, unspecified: Secondary | ICD-10-CM | POA: Diagnosis not present

## 2019-09-03 DIAGNOSIS — E872 Acidosis: Secondary | ICD-10-CM | POA: Diagnosis not present

## 2019-09-03 DIAGNOSIS — I5082 Biventricular heart failure: Secondary | ICD-10-CM | POA: Diagnosis present

## 2019-09-03 DIAGNOSIS — R351 Nocturia: Secondary | ICD-10-CM | POA: Diagnosis present

## 2019-09-03 DIAGNOSIS — R0600 Dyspnea, unspecified: Secondary | ICD-10-CM | POA: Diagnosis present

## 2019-09-03 DIAGNOSIS — Z882 Allergy status to sulfonamides status: Secondary | ICD-10-CM | POA: Diagnosis not present

## 2019-09-03 DIAGNOSIS — Z79899 Other long term (current) drug therapy: Secondary | ICD-10-CM

## 2019-09-03 DIAGNOSIS — Z87891 Personal history of nicotine dependence: Secondary | ICD-10-CM | POA: Diagnosis not present

## 2019-09-03 DIAGNOSIS — Z885 Allergy status to narcotic agent status: Secondary | ICD-10-CM

## 2019-09-03 DIAGNOSIS — R0602 Shortness of breath: Secondary | ICD-10-CM | POA: Diagnosis not present

## 2019-09-03 DIAGNOSIS — Z66 Do not resuscitate: Secondary | ICD-10-CM | POA: Diagnosis present

## 2019-09-03 DIAGNOSIS — I10 Essential (primary) hypertension: Secondary | ICD-10-CM | POA: Diagnosis not present

## 2019-09-03 DIAGNOSIS — Z8249 Family history of ischemic heart disease and other diseases of the circulatory system: Secondary | ICD-10-CM

## 2019-09-03 DIAGNOSIS — J9621 Acute and chronic respiratory failure with hypoxia: Principal | ICD-10-CM | POA: Diagnosis present

## 2019-09-03 DIAGNOSIS — G4733 Obstructive sleep apnea (adult) (pediatric): Secondary | ICD-10-CM | POA: Diagnosis present

## 2019-09-03 DIAGNOSIS — Z880 Allergy status to penicillin: Secondary | ICD-10-CM

## 2019-09-03 DIAGNOSIS — I5030 Unspecified diastolic (congestive) heart failure: Secondary | ICD-10-CM | POA: Diagnosis present

## 2019-09-03 DIAGNOSIS — N401 Enlarged prostate with lower urinary tract symptoms: Secondary | ICD-10-CM | POA: Diagnosis present

## 2019-09-03 DIAGNOSIS — J9601 Acute respiratory failure with hypoxia: Secondary | ICD-10-CM | POA: Diagnosis not present

## 2019-09-03 DIAGNOSIS — J9602 Acute respiratory failure with hypercapnia: Secondary | ICD-10-CM | POA: Diagnosis not present

## 2019-09-03 DIAGNOSIS — Z881 Allergy status to other antibiotic agents status: Secondary | ICD-10-CM | POA: Diagnosis not present

## 2019-09-03 DIAGNOSIS — J441 Chronic obstructive pulmonary disease with (acute) exacerbation: Secondary | ICD-10-CM | POA: Diagnosis present

## 2019-09-03 DIAGNOSIS — D509 Iron deficiency anemia, unspecified: Secondary | ICD-10-CM | POA: Diagnosis present

## 2019-09-03 DIAGNOSIS — I5033 Acute on chronic diastolic (congestive) heart failure: Secondary | ICD-10-CM | POA: Diagnosis present

## 2019-09-03 DIAGNOSIS — R4182 Altered mental status, unspecified: Secondary | ICD-10-CM | POA: Diagnosis not present

## 2019-09-03 DIAGNOSIS — J9690 Respiratory failure, unspecified, unspecified whether with hypoxia or hypercapnia: Secondary | ICD-10-CM | POA: Diagnosis not present

## 2019-09-03 DIAGNOSIS — I110 Hypertensive heart disease with heart failure: Secondary | ICD-10-CM | POA: Diagnosis not present

## 2019-09-03 DIAGNOSIS — Z800 Family history of malignant neoplasm of digestive organs: Secondary | ICD-10-CM

## 2019-09-03 DIAGNOSIS — E669 Obesity, unspecified: Secondary | ICD-10-CM | POA: Diagnosis present

## 2019-09-03 DIAGNOSIS — J9611 Chronic respiratory failure with hypoxia: Secondary | ICD-10-CM | POA: Diagnosis present

## 2019-09-03 DIAGNOSIS — Z9079 Acquired absence of other genital organ(s): Secondary | ICD-10-CM | POA: Diagnosis not present

## 2019-09-03 DIAGNOSIS — Q211 Atrial septal defect: Secondary | ICD-10-CM | POA: Diagnosis not present

## 2019-09-03 DIAGNOSIS — N400 Enlarged prostate without lower urinary tract symptoms: Secondary | ICD-10-CM | POA: Diagnosis not present

## 2019-09-03 LAB — COMPREHENSIVE METABOLIC PANEL
ALT: 24 U/L (ref 0–44)
AST: 20 U/L (ref 15–41)
Albumin: 3.1 g/dL — ABNORMAL LOW (ref 3.5–5.0)
Alkaline Phosphatase: 60 U/L (ref 38–126)
Anion gap: 10 (ref 5–15)
BUN: 14 mg/dL (ref 8–23)
CO2: 31 mmol/L (ref 22–32)
Calcium: 8.5 mg/dL — ABNORMAL LOW (ref 8.9–10.3)
Chloride: 96 mmol/L — ABNORMAL LOW (ref 98–111)
Creatinine, Ser: 1.02 mg/dL (ref 0.61–1.24)
GFR calc Af Amer: >60 mL/min (ref >60)
GFR calc non Af Amer: >60 mL/min (ref >60)
Glucose, Bld: 125 mg/dL — ABNORMAL HIGH (ref 70–99)
Potassium: 4.3 mmol/L (ref 3.5–5.1)
Sodium: 137 mmol/L (ref 135–145)
Total Bilirubin: 0.4 mg/dL (ref 0.3–1.2)
Total Protein: 5.9 g/dL — ABNORMAL LOW (ref 6.5–8.1)

## 2019-09-03 LAB — CBC WITH DIFFERENTIAL/PLATELET
Abs Immature Granulocytes: 0.03 10*3/uL (ref 0.00–0.07)
Basophils Absolute: 0 10*3/uL (ref 0.0–0.1)
Basophils Relative: 0 %
Eosinophils Absolute: 0.2 10*3/uL (ref 0.0–0.5)
Eosinophils Relative: 3 %
HCT: 40.3 % (ref 39.0–52.0)
Hemoglobin: 12.5 g/dL — ABNORMAL LOW (ref 13.0–17.0)
Immature Granulocytes: 1 %
Lymphocytes Relative: 13 %
Lymphs Abs: 0.7 10*3/uL (ref 0.7–4.0)
MCH: 29.6 pg (ref 26.0–34.0)
MCHC: 31 g/dL (ref 30.0–36.0)
MCV: 95.3 fL (ref 80.0–100.0)
Monocytes Absolute: 0.4 10*3/uL (ref 0.1–1.0)
Monocytes Relative: 7 %
Neutro Abs: 4.1 10*3/uL (ref 1.7–7.7)
Neutrophils Relative %: 76 %
Platelets: 184 10*3/uL (ref 150–400)
RBC: 4.23 MIL/uL (ref 4.22–5.81)
RDW: 14.5 % (ref 11.5–15.5)
WBC: 5.4 10*3/uL (ref 4.0–10.5)
nRBC: 0 % (ref 0.0–0.2)

## 2019-09-03 LAB — POCT I-STAT EG7
Acid-Base Excess: 8 mmol/L — ABNORMAL HIGH (ref 0.0–2.0)
Bicarbonate: 35.2 mmol/L — ABNORMAL HIGH (ref 20.0–28.0)
Calcium, Ion: 1.13 mmol/L — ABNORMAL LOW (ref 1.15–1.40)
HCT: 37 % — ABNORMAL LOW (ref 39.0–52.0)
Hemoglobin: 12.6 g/dL — ABNORMAL LOW (ref 13.0–17.0)
O2 Saturation: 95 %
Potassium: 4.3 mmol/L (ref 3.5–5.1)
Sodium: 136 mmol/L (ref 135–145)
TCO2: 37 mmol/L — ABNORMAL HIGH (ref 22–32)
pCO2, Ven: 62 mmHg — ABNORMAL HIGH (ref 44.0–60.0)
pH, Ven: 7.362 (ref 7.250–7.430)
pO2, Ven: 80 mmHg — ABNORMAL HIGH (ref 32.0–45.0)

## 2019-09-03 LAB — BRAIN NATRIURETIC PEPTIDE: B Natriuretic Peptide: 85.2 pg/mL (ref 0.0–100.0)

## 2019-09-03 LAB — TROPONIN I (HIGH SENSITIVITY): Troponin I (High Sensitivity): 9 ng/L (ref <18)

## 2019-09-03 LAB — SARS CORONAVIRUS 2 BY RT PCR (HOSPITAL ORDER, PERFORMED IN ~~LOC~~ HOSPITAL LAB): SARS Coronavirus 2: NEGATIVE

## 2019-09-03 MED ORDER — ONDANSETRON HCL 4 MG PO TABS: 4.0000 mg | ORAL_TABLET | Freq: Four times a day (QID) | ORAL | Status: DC | PRN

## 2019-09-03 MED ORDER — ALBUTEROL SULFATE HFA 108 (90 BASE) MCG/ACT IN AERS
4.0000 | INHALATION_SPRAY | Freq: Once | RESPIRATORY_TRACT | Status: AC
  Administered 2019-09-03: 20:00:00 4 via RESPIRATORY_TRACT
  Filled 2019-09-03: qty 6.7

## 2019-09-03 MED ORDER — METHYLPREDNISOLONE SODIUM SUCC 125 MG IJ SOLR
125.0000 mg | Freq: Once | INTRAMUSCULAR | Status: AC
  Administered 2019-09-03: 18:00:00 125 mg via INTRAVENOUS
  Filled 2019-09-03: qty 2

## 2019-09-03 MED ORDER — ENOXAPARIN SODIUM 40 MG/0.4ML ~~LOC~~ SOLN
40.0000 mg | SUBCUTANEOUS | Status: DC
  Administered 2019-09-04 – 2019-09-07 (×4): 40 mg via SUBCUTANEOUS
  Filled 2019-09-03 (×5): qty 0.4

## 2019-09-03 MED ORDER — ONDANSETRON HCL 4 MG/2ML IJ SOLN: 4.0000 mg | Freq: Four times a day (QID) | INTRAMUSCULAR | Status: DC | PRN

## 2019-09-03 MED ORDER — ALBUTEROL SULFATE (2.5 MG/3ML) 0.083% IN NEBU
3.0000 mL | INHALATION_SOLUTION | Freq: Four times a day (QID) | RESPIRATORY_TRACT | Status: DC | PRN
  Administered 2019-09-03 – 2019-09-04 (×2): 3 mL via RESPIRATORY_TRACT
  Filled 2019-09-03: qty 3

## 2019-09-03 MED ORDER — AEROCHAMBER PLUS FLO-VU MISC
1.0000 | Freq: Once | Status: AC
  Administered 2019-09-03: 20:00:00 1
  Filled 2019-09-03: qty 1

## 2019-09-03 MED ORDER — IPRATROPIUM-ALBUTEROL 20-100 MCG/ACT IN AERS
2.0000 | INHALATION_SPRAY | Freq: Two times a day (BID) | RESPIRATORY_TRACT | Status: DC
  Filled 2019-09-03: qty 4

## 2019-09-03 MED ORDER — FUROSEMIDE 10 MG/ML IJ SOLN: 40.0000 mg | Freq: Every day | INTRAMUSCULAR | Status: DC

## 2019-09-03 MED ORDER — ACETAMINOPHEN 325 MG PO TABS: 650.0000 mg | ORAL_TABLET | Freq: Four times a day (QID) | ORAL | Status: DC | PRN

## 2019-09-03 MED ORDER — SODIUM CHLORIDE 0.9 % IV SOLN
100.0000 mg | Freq: Once | INTRAVENOUS | Status: DC
  Administered 2019-09-03: 20:00:00 100 mg via INTRAVENOUS
  Filled 2019-09-03: qty 100

## 2019-09-03 MED ORDER — MAGNESIUM SULFATE 2 GM/50ML IV SOLN
2.0000 g | Freq: Once | INTRAVENOUS | Status: AC
  Administered 2019-09-03: 18:00:00 2 g via INTRAVENOUS
  Filled 2019-09-03: qty 50

## 2019-09-03 MED ORDER — FUROSEMIDE 10 MG/ML IJ SOLN
40.0000 mg | Freq: Once | INTRAMUSCULAR | Status: AC
  Administered 2019-09-03: 18:00:00 40 mg via INTRAVENOUS
  Filled 2019-09-03: qty 4

## 2019-09-03 MED ORDER — ALBUTEROL SULFATE HFA 108 (90 BASE) MCG/ACT IN AERS
8.0000 | INHALATION_SPRAY | Freq: Once | RESPIRATORY_TRACT | Status: AC
  Administered 2019-09-03: 18:00:00 8 via RESPIRATORY_TRACT
  Filled 2019-09-03: qty 6.7

## 2019-09-03 MED ORDER — ACETAMINOPHEN 650 MG RE SUPP: 650.0000 mg | Freq: Four times a day (QID) | RECTAL | Status: DC | PRN

## 2019-09-03 NOTE — H&P (Signed)
Date: 09/03/2019               Patient Name:  Aaron Daniels MRN: [id number]  DOB: Apr 21, 1941 Age / Sex: 78 y.o., male   PCP: Nolon Nations         Medical Service: Internal Medicine Teaching Service         Attending Physician: Dr. Aldine Contes, MD    First Contact: Dr. Sheppard Coil Pager: [phone]  Second Contact: Dr. Berline Lopes Pager: [phone]       After Hours (After 5p/  First Contact Pager: [phone]  weekends / holidays): Second Contact Pager: [phone]   Chief Complaint: [name]  History of Present Illness: Aaron Daniels is a 78 y.o male with HTN, BPH s/p TURP, and a significant tobacco use history who presented to the ED with progressive bilateral LE edema and SHOB of 2 months duration. History was obtained via the patient, his wife, his son, and through chart review.   About 2 months ago he noticed progressive bilateral LE edema. He waited 2 weeks then went to his PCP and his weight was noted to have increased from 208lbs to 243lbs. He waited another 2 weeks then experienced another 12lb weight gain and at that point came to the ED. In the ED on 08/03, he was experiencing generalized LE swelling and SHOB. His peripheral edema was felt to be secondary to venous stasis. His oxygen saturation was 94% and he was discharge with a prescription of furosemide and instructions to follow-up with his PCP. He then presented to the ED on 09/02 with worsening LE edema and DOE interfering with his ability to perform his ADLs. CXR illustrated pulmonary congestion but BNP was normal at 32. He was noted to have a low SaO2 of 85-86% with ambulation. It was recommended that he be admitted to the hospital for further evaluation but he declined; therefore, he was given albuterol, prednisone, and set to follow-up with his PCP. He has continued to have worsening LE and SHOB. He also feels that the lasix is not working as well and this prompted him to come to the ED for further evaluation.   Prior to the  onset of symptoms he was able to ambulate around his house and do minor chores without DOE. This progressed to the point he could not walk more than 30 feet without the need to stop and now at rest. No relief with albuterol but does get some relief with nebulizer treatments.  In addition to the above symptoms he has a productive cough at times but it is difficult to get much sputum up, "rattling" in his chest, cough, nocturia (3-4x per night), wheezing, and dizziness of 1 week duration. He denies orthopnea, PND, CP, fevers, changes in bowel movements.   Meds:  No current facility-administered medications on file prior to encounter.    Current Outpatient Medications on File Prior to Encounter  Medication Sig Dispense Refill  . acetaminophen (TYLENOL) 325 MG tablet Take 650 mg by mouth every 6 (six) hours as needed for mild pain.    Marland Kitchen albuterol (VENTOLIN HFA) 108 (90 Base) MCG/ACT inhaler Inhale 3 puffs into the lungs every 6 (six) hours as needed for wheezing or shortness of breath.    . furosemide (LASIX) 40 MG tablet Take 1 tablet (40 mg total) by mouth daily. 30 tablet 0  . lisinopril (ZESTRIL) 10 MG tablet Take 10 mg by mouth daily.    . predniSONE (DELTASONE) 20 MG tablet Take 60 mg  daily x 2 days then 40 mg daily x 2 days then 20 mg daily x 2 days 12 tablet 0   Allergies: Allergies as of 09/03/2019 - Review Complete 09/03/2019  Allergen Reaction Noted  . Codeine  07/03/2019  . Penicillins  07/03/2019  . Sulfa antibiotics  07/03/2019   Past Medical History:  Diagnosis Date  . COPD (chronic obstructive pulmonary disease) (HCC)   . 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 Now retired and lives with his wife.  Never been told he had COPD but smoked for 56 years. Quit 7 years ago. Prior to that smoked 1-2 PPD.  Previous EtOH use, quit 32  years ago. Previously a heavy drinker.  Denies the use of illicit substances  Review of Systems: A complete ROS was negative except as per HPI.   Physical Exam: Blood pressure (!) 146/102, pulse 95, temperature 98.9 F (37.2 C), temperature source Oral, resp. rate (!) 21, SpO2 94 %. Physical Exam  Constitutional: He is oriented to person, place, and time.  Eyes: EOM are normal.  Cardiovascular: Normal rate, regular rhythm, normal heart sounds and intact distal pulses. Exam reveals no gallop and no friction rub.  No murmur heard. Pulmonary/Chest: No accessory muscle usage. He has wheezes. He has rales. He exhibits no tenderness.  Abdominal: Soft. There is no abdominal tenderness.  Musculoskeletal:        General: Tenderness (LE) and edema (2-3+ pitting) present.  Neurological: He is alert and oriented to person, place, and time.  Skin:  Erythematous rash on his bilateral lower extremities with a wound at the anterior aspect of his right shin.  There is no purulent drainage.  Both legs are warm.    EKG: personally reviewed my interpretation is within normal limits  CXR: personally reviewed my interpretation is Pulmonary venous hypertension and mild interstitial edema, similar to the study of 08/14/2019.  Assessment & Plan by Problem: Active Problems:   Acute respiratory failure with hypoxia (HCC)  Aaron Daniels is a 78 y.o male with HTN, BPH s/p TURP, and a significant tobacco use history who presented to the ED with progressive bilateral LE edema and SHOB of 2 months duration that is concerning for CHF.  Acute Respiratory Failure with Hypoxia: -Received albuterol HFA and corticosteroid in the ED - Ordered echocardiogram - Gave Furosemide 40 mg and another dose tomorrow morning at 10 am. - Albuterol nebulizers  - Dailey weights - 2 liters supplemental O2  Diet: Heart healthy diet with fluid restriction VTE ppx: Enoxaparin 40 mg CODE STATUS: DNR  Dispo: Admit patient to  Inpatient with expected length of stay greater than 2 midnights.  Signed: Dellia Cloud, MD 09/03/2019, 11:27 PM

## 2019-09-03 NOTE — ED Triage Notes (Addendum)
Patient complains of increased SOB and edema. States that he was recently admitted for same and not feeling any better. Using inhaler and diuretics with minimal relief. Congested on assessment. Placed on oxygen on arrival. Non-smoker sats 85% on arrival, placed on oxygen at 2l.  sats now 98 on 2l

## 2019-09-03 NOTE — ED Provider Notes (Signed)
[facility] EMERGENCY DEPARTMENT Provider Note   CSN: [id number] Arrival date & time: 09/03/19  1625     History   Chief Complaint Chief Complaint  Patient presents with  . SOB/ Edema    HPI Aaron Daniels is a 78 y.o. male.     78 yo M with a chief complaint of shortness of breath.  Patient has had issues off and on for the past few weeks up to a month.  States that he is had worsening swelling to his lower extremities that made its way up to his abdomen.  Worse with lying back and with exertion.  He is also been coughing quite a bit which is typical of his lung disease.  Has been using his albuterol inhaler without any improvement at home.  Got to the point today where he thought it was too bad to stay at home and so came to the ED for evaluation.  He felt that he has had some low-grade fevers at home but no overt temperature.  Has had increased cough increased sputum and change in sputum.  Has been taking his Lasix at home without issue.  The history is provided by the patient.  Illness Severity:  Moderate Onset quality:  Gradual Duration:  2 weeks Timing:  Constant Progression:  Worsening Chronicity:  Recurrent Associated symptoms: cough, fever (subjective) and shortness of breath   Associated symptoms: no abdominal pain, no chest pain, no congestion, no diarrhea, no headaches, no myalgias, no rash and no vomiting     Past Medical History:  Diagnosis Date  . COPD (chronic obstructive pulmonary disease) (HCC)   . Hypertension     Patient Active Problem List   Diagnosis Date Noted  . Acute respiratory failure with hypoxia (HCC) 09/03/2019    History reviewed. No pertinent surgical history.      Home Medications    Prior to Admission medications   Medication Sig Start Date End Date Taking? Authorizing Provider  acetaminophen (TYLENOL) 325 MG tablet Take 650 mg by mouth every 6 (six) hours as needed for mild pain.   Yes [provider]   albuterol (VENTOLIN HFA) 108 (90 Base) MCG/ACT inhaler Inhale 3 puffs into the lungs every 6 (six) hours as needed for wheezing or shortness of breath.   Yes [provider]  furosemide (LASIX) 40 MG tablet Take 1 tablet (40 mg total) by mouth daily. 07/14/19  Yes Dione Booze, MD  lisinopril (ZESTRIL) 10 MG tablet Take 10 mg by mouth daily. 05/01/19  Yes [provider]  predniSONE (DELTASONE) 20 MG tablet Take 60 mg daily x 2 days then 40 mg daily x 2 days then 20 mg daily x 2 days 08/14/19  Yes Charlynne Pander, MD    Family History No family history on file.  Social History Social History   Tobacco Use  . Smoking status: Former Smoker    Quit date: 07/13/2012    Years since quitting: 7.1  Substance Use Topics  . Alcohol use: Never    Frequency: Never  . Drug use: Never     Allergies   Codeine, Penicillins, and Sulfa antibiotics   Review of Systems Review of Systems  Constitutional: Positive for fever (subjective). Negative for chills.  HENT: Negative for congestion and facial swelling.   Eyes: Negative for discharge and visual disturbance.  Respiratory: Positive for cough and shortness of breath.   Cardiovascular: Positive for leg swelling. Negative for chest pain and palpitations.  Gastrointestinal: Negative  for abdominal pain, diarrhea and vomiting.  Musculoskeletal: Negative for arthralgias and myalgias.  Skin: Negative for color change and rash.  Neurological: Negative for tremors, syncope and headaches.  Psychiatric/Behavioral: Negative for confusion and dysphoric mood.     Physical Exam Updated Vital Signs BP (!) 146/102   Pulse 95   Temp 98.9 F (37.2 C) (Oral)   Resp (!) 21   SpO2 94%   Physical Exam Vitals signs and nursing note reviewed.  Constitutional:      Appearance: He is well-developed.  HENT:     Head: Normocephalic and atraumatic.     Comments: Difficult to appreciate JVD with his large beard.  Does appear to go to the mid  neck. Eyes:     Pupils: Pupils are equal, round, and reactive to light.  Neck:     Musculoskeletal: Normal range of motion and neck supple.     Vascular: No JVD.  Cardiovascular:     Rate and Rhythm: Normal rate and regular rhythm.     Heart sounds: No murmur. No friction rub. No gallop.   Pulmonary:     Effort: No respiratory distress.     Breath sounds: Wheezing and rales present.     Comments: Rales up to the mid lung fields prolonged expiration wheezes Abdominal:     General: There is no distension.     Tenderness: There is no abdominal tenderness. There is no guarding or rebound.  Musculoskeletal: Normal range of motion.     Right lower leg: Edema present.     Left lower leg: Edema present.     Comments: 3+ edema to the bilateral lower extremities up to the hips.  Skin:    Coloration: Skin is not pale.     Findings: No rash.  Neurological:     Mental Status: He is alert and oriented to person, place, and time.  Psychiatric:        Behavior: Behavior normal.      ED Treatments / Results  Labs (all labs ordered are listed, but only abnormal results are displayed) Labs Reviewed  CBC WITH DIFFERENTIAL/PLATELET - Abnormal; Notable for the following components:      Result Value   Hemoglobin 12.5 (*)    All other components within normal limits  COMPREHENSIVE METABOLIC PANEL - Abnormal; Notable for the following components:   Chloride 96 (*)    Glucose, Bld 125 (*)    Calcium 8.5 (*)    Total Protein 5.9 (*)    Albumin 3.1 (*)    All other components within normal limits  POCT I-STAT EG7 - Abnormal; Notable for the following components:   pCO2, Ven 62.0 (*)    pO2, Ven 80.0 (*)    Bicarbonate 35.2 (*)    TCO2 37 (*)    Acid-Base Excess 8.0 (*)    Calcium, Ion 1.13 (*)    HCT 37.0 (*)    Hemoglobin 12.6 (*)    All other components within normal limits  SARS CORONAVIRUS 2 (HOSPITAL ORDER, PERFORMED IN [facility])  BRAIN NATRIURETIC PEPTIDE  BASIC  METABOLIC PANEL  MAGNESIUM  URINALYSIS, ROUTINE W REFLEX MICROSCOPIC  IRON AND TIBC  FERRITIN  TROPONIN I (HIGH SENSITIVITY)    EKG EKG Interpretation  Date/Time:  Wednesday September 03 2019 16:36:20 EDT Ventricular Rate:  104 PR Interval:  138 QRS Duration: 68 QT Interval:  336 QTC Calculation: 441 R Axis:   -25 Text Interpretation:  Sinus tachycardia with Fusion complexes Low  voltage QRS Borderline ECG No significant change since last tracing Confirmed by Melene Plan [id number]) on 09/03/2019 5:14:04 PM   Radiology Dg Chest Port 1 View  Result Date: 09/03/2019 CLINICAL DATA:  Shortness of breath and cough.  Body swelling. EXAM: PORTABLE CHEST 1 VIEW COMPARISON:  08/14/2019 FINDINGS: Heart size upper limits of normal. Chronic aortic atherosclerosis. Possible venous hypertension without frank edema. Interstitial markings at the lung bases most consistent with interstitial edema. Similar appearance to the most previous exam. IMPRESSION: Pulmonary venous hypertension and mild interstitial edema, similar to the study of 08/14/2019. Electronically Signed   By: Paulina Fusi M.D.   On: 09/03/2019 17:56    Procedures Procedures (including critical care time)  Medications Ordered in ED Medications  albuterol (PROVENTIL) (2.5 MG/3ML) 0.083% nebulizer solution 3 mL (has no administration in time range)  enoxaparin (LOVENOX) injection 40 mg (has no administration in time range)  acetaminophen (TYLENOL) tablet 650 mg (has no administration in time range)    Or  acetaminophen (TYLENOL) suppository 650 mg (has no administration in time range)  ondansetron (ZOFRAN) tablet 4 mg (has no administration in time range)    Or  ondansetron (ZOFRAN) injection 4 mg (has no administration in time range)  furosemide (LASIX) injection 40 mg (has no administration in time range)  albuterol (VENTOLIN HFA) 108 (90 Base) MCG/ACT inhaler 8 puff (8 puffs Inhalation Given 09/03/19 1806)  aerochamber plus with mask  device 1 each (1 each Other Given 09/03/19 2020)  methylPREDNISolone sodium succinate (SOLU-MEDROL) 125 mg/2 mL injection 125 mg (125 mg Intravenous Given 09/03/19 1807)  magnesium sulfate IVPB 2 g 50 mL (0 g Intravenous Stopped 09/03/19 1931)  furosemide (LASIX) injection 40 mg (40 mg Intravenous Given 09/03/19 1818)  albuterol (VENTOLIN HFA) 108 (90 Base) MCG/ACT inhaler 4 puff (4 puffs Inhalation Given 09/03/19 2020)     Initial Impression / Assessment and Plan / ED Course  I have reviewed the triage vital signs and the nursing notes.  Pertinent labs & imaging results that were available during my care of the patient were reviewed by me and considered in my medical decision making (see chart for details).        78 yo M with a chief complaint of shortness of breath.  This been going on for the past couple weeks.  Was seen in the ED at the onset.  Did steroids and albuterol with some transient improvement as well as Lasix.  Patient's edema has gotten worse at home and so came in today.  I reviewed the patient's prior visit.  At that time he had exertional hypoxia.  Today he is hypoxic at rest.  Was started on oxygen.  His shortness of breath may be multifactorial.  Will treat for COPD exacerbation and as well as CHF exacerbation.  CXR without obvious focal infiltrate or significant edema on my view.  Patient feeling much better on reassessment.  Has had a large amount of urine output with 40 of IV Lasix.  Lungs now more consistent with a COPD exacerbation.  Will give doxycycline.  Continue breathing treatments.  Will discuss with medicine for admission.  CRITICAL CARE Performed by: Rae Roam   Total critical care time: 80 minutes  Critical care time was exclusive of separately billable procedures and treating other patients.  Critical care was necessary to treat or prevent imminent or life-threatening deterioration.  Critical care was time spent personally by me on the  following activities: development of treatment plan with patient  and/or surrogate as well as nursing, discussions with consultants, evaluation of patient's response to treatment, examination of patient, obtaining history from patient or surrogate, ordering and performing treatments and interventions, ordering and review of laboratory studies, ordering and review of radiographic studies, pulse oximetry and re-evaluation of patient's condition.  The patients results and plan were reviewed and discussed.   Any x-rays performed were independently reviewed by myself.   Differential diagnosis were considered with the presenting HPI.  Medications  albuterol (PROVENTIL) (2.5 MG/3ML) 0.083% nebulizer solution 3 mL (has no administration in time range)  enoxaparin (LOVENOX) injection 40 mg (has no administration in time range)  acetaminophen (TYLENOL) tablet 650 mg (has no administration in time range)    Or  acetaminophen (TYLENOL) suppository 650 mg (has no administration in time range)  ondansetron (ZOFRAN) tablet 4 mg (has no administration in time range)    Or  ondansetron (ZOFRAN) injection 4 mg (has no administration in time range)  furosemide (LASIX) injection 40 mg (has no administration in time range)  albuterol (VENTOLIN HFA) 108 (90 Base) MCG/ACT inhaler 8 puff (8 puffs Inhalation Given 09/03/19 1806)  aerochamber plus with mask device 1 each (1 each Other Given 09/03/19 2020)  methylPREDNISolone sodium succinate (SOLU-MEDROL) 125 mg/2 mL injection 125 mg (125 mg Intravenous Given 09/03/19 1807)  magnesium sulfate IVPB 2 g 50 mL (0 g Intravenous Stopped 09/03/19 1931)  furosemide (LASIX) injection 40 mg (40 mg Intravenous Given 09/03/19 1818)  albuterol (VENTOLIN HFA) 108 (90 Base) MCG/ACT inhaler 4 puff (4 puffs Inhalation Given 09/03/19 2020)    Vitals:   09/03/19 1712 09/03/19 1715 09/03/19 1900 09/03/19 1930  BP: (!) 163/107 (!) 146/98 (!) 176/103 (!) 146/102  Pulse: (!) 103 100 94 95   Resp: 12 (!) 21 (!) 25 (!) 21  Temp:      TempSrc:      SpO2: 96% 97% 95% 94%    Final diagnoses:  Acute on chronic respiratory failure with hypoxia (HCC)    Admission/ observation were discussed with the admitting physician, patient and/or family and they are comfortable with the plan.   Final Clinical Impressions(s) / ED Diagnoses   Final diagnoses:  Acute on chronic respiratory failure with hypoxia [facility])    ED Discharge Orders    None       Deno Etienne, DO 09/03/19 2226

## 2019-09-04 ENCOUNTER — Encounter (HOSPITAL_COMMUNITY): Payer: Self-pay | Admitting: Emergency Medicine

## 2019-09-04 ENCOUNTER — Other Ambulatory Visit (HOSPITAL_COMMUNITY): Payer: Medicare Other

## 2019-09-04 ENCOUNTER — Inpatient Hospital Stay (HOSPITAL_COMMUNITY): Payer: Medicare Other

## 2019-09-04 DIAGNOSIS — E8770 Fluid overload, unspecified: Secondary | ICD-10-CM

## 2019-09-04 DIAGNOSIS — Z87891 Personal history of nicotine dependence: Secondary | ICD-10-CM

## 2019-09-04 DIAGNOSIS — D509 Iron deficiency anemia, unspecified: Secondary | ICD-10-CM

## 2019-09-04 DIAGNOSIS — N400 Enlarged prostate without lower urinary tract symptoms: Secondary | ICD-10-CM

## 2019-09-04 DIAGNOSIS — R0600 Dyspnea, unspecified: Secondary | ICD-10-CM | POA: Diagnosis present

## 2019-09-04 DIAGNOSIS — I10 Essential (primary) hypertension: Secondary | ICD-10-CM

## 2019-09-04 DIAGNOSIS — Z66 Do not resuscitate: Secondary | ICD-10-CM

## 2019-09-04 DIAGNOSIS — Z9079 Acquired absence of other genital organ(s): Secondary | ICD-10-CM

## 2019-09-04 LAB — BLOOD GAS, ARTERIAL
Acid-Base Excess: 14.2 mmol/L — ABNORMAL HIGH (ref 0.0–2.0)
Bicarbonate: 40.9 mmol/L — ABNORMAL HIGH (ref 20.0–28.0)
Drawn by: 511911
O2 Content: 5 L/min
O2 Saturation: 87.9 %
Patient temperature: 97.9
pCO2 arterial: 83.4 mmHg (ref 32.0–48.0)
pH, Arterial: 7.31 — ABNORMAL LOW (ref 7.350–7.450)
pO2, Arterial: 55.7 mmHg — ABNORMAL LOW (ref 83.0–108.0)

## 2019-09-04 LAB — HEMOGLOBIN A1C
Hgb A1c MFr Bld: 6.5 % — ABNORMAL HIGH (ref 4.8–5.6)
Mean Plasma Glucose: 139.85 mg/dL

## 2019-09-04 LAB — URINALYSIS, ROUTINE W REFLEX MICROSCOPIC
Bilirubin Urine: NEGATIVE
Glucose, UA: NEGATIVE mg/dL
Hgb urine dipstick: NEGATIVE
Ketones, ur: NEGATIVE mg/dL
Leukocytes,Ua: NEGATIVE
Nitrite: NEGATIVE
Protein, ur: NEGATIVE mg/dL
Specific Gravity, Urine: 1.008 (ref 1.005–1.030)
pH: 7 (ref 5.0–8.0)

## 2019-09-04 LAB — MAGNESIUM: Magnesium: 2 mg/dL (ref 1.7–2.4)

## 2019-09-04 LAB — GLUCOSE, CAPILLARY: Glucose-Capillary: 114 mg/dL — ABNORMAL HIGH (ref 70–99)

## 2019-09-04 LAB — PROTIME-INR
INR: 1.1 (ref 0.8–1.2)
Prothrombin Time: 13.6 seconds (ref 11.4–15.2)

## 2019-09-04 LAB — BASIC METABOLIC PANEL
Anion gap: 10 (ref 5–15)
BUN: 15 mg/dL (ref 8–23)
CO2: 34 mmol/L — ABNORMAL HIGH (ref 22–32)
Calcium: 8.6 mg/dL — ABNORMAL LOW (ref 8.9–10.3)
Chloride: 96 mmol/L — ABNORMAL LOW (ref 98–111)
Creatinine, Ser: 1.06 mg/dL (ref 0.61–1.24)
GFR calc Af Amer: >60 mL/min (ref >60)
GFR calc non Af Amer: >60 mL/min (ref >60)
Glucose, Bld: 191 mg/dL — ABNORMAL HIGH (ref 70–99)
Potassium: 4.4 mmol/L (ref 3.5–5.1)
Sodium: 140 mmol/L (ref 135–145)

## 2019-09-04 LAB — IRON AND TIBC
Iron: 38 ug/dL — ABNORMAL LOW (ref 45–182)
Saturation Ratios: 10 % — ABNORMAL LOW (ref 17.9–39.5)
TIBC: 399 ug/dL (ref 250–450)
UIBC: 361 ug/dL

## 2019-09-04 LAB — APTT: aPTT: 30 seconds (ref 24–36)

## 2019-09-04 LAB — FERRITIN: Ferritin: 22 ng/mL — ABNORMAL LOW (ref 24–336)

## 2019-09-04 MED ORDER — FUROSEMIDE 10 MG/ML IJ SOLN
80.0000 mg | Freq: Every day | INTRAMUSCULAR | Status: DC
  Administered 2019-09-04 – 2019-09-05 (×2): 80 mg via INTRAVENOUS
  Filled 2019-09-04 (×2): qty 8

## 2019-09-04 MED ORDER — SODIUM CHLORIDE 0.9 % IV SOLN
510.0000 mg | Freq: Once | INTRAVENOUS | Status: AC
  Administered 2019-09-04: 510 mg via INTRAVENOUS
  Filled 2019-09-04: qty 17

## 2019-09-04 MED ORDER — INSULIN ASPART 100 UNIT/ML ~~LOC~~ SOLN
0.0000 [IU] | Freq: Three times a day (TID) | SUBCUTANEOUS | Status: DC
  Administered 2019-09-05 (×2): 2 [IU] via SUBCUTANEOUS
  Administered 2019-09-05: 11:00:00 1 [IU] via SUBCUTANEOUS
  Administered 2019-09-06 – 2019-09-08 (×4): 2 [IU] via SUBCUTANEOUS
  Administered 2019-09-08: 17:00:00 3 [IU] via SUBCUTANEOUS

## 2019-09-04 MED ORDER — IPRATROPIUM-ALBUTEROL 0.5-2.5 (3) MG/3ML IN SOLN
3.0000 mL | Freq: Four times a day (QID) | RESPIRATORY_TRACT | Status: DC | PRN
  Administered 2019-09-04: 3 mL via RESPIRATORY_TRACT
  Filled 2019-09-04: qty 3

## 2019-09-04 NOTE — ED Notes (Signed)
Pt transferred to hospital be, resting comfortably

## 2019-09-04 NOTE — Significant Event (Signed)
Rapid Response Event Note  Overview:  Called by RN for pt with AMS. Instructed RN to get a recent set of vitals and CBG (114)   Initial Focused Assessment: On arrival pt alert in bed with some slurred speech. Able to say his name but everything else was incomprehensible. No weakness noted in extremities. LVO screening negative. HR 95, BP 123/84, RR 24, spO2 92% on 7LNC. Lungs coarse on the left, diminished on the right with weak congested cough. Bedside RN stated that pt was asleep at shift change and had just woke up when family came to visit.   Pt's mentation improved as time went on and he became more interactive with his speech clearer. He was still confused but able to be redirected. Questionable sundowning/delirium?  Interventions: Internal medicine team paged and at bedside abg CXR HFNC placed spO2 goal of 88% or greater  Plan of Care (if not transferred): Continue to monitor pt neuro and respiratory status. Ok with spO2 88% >. Keep lights and volume down at night to promote proper sleep wake cycles. Use bed alarm and keep an eye on confused pt closely. Explained plan of care with RN who agreed. Instructed to call with any changes or concerns.   Event Summary:  Called at   2005   Event ended at  2105        [facility]

## 2019-09-04 NOTE — Evaluation (Signed)
Physical Therapy Evaluation Patient Details Name: Aaron Daniels MRN: [id number] DOB: 03/20/1941 Today's Date: 09/04/2019   History of Present Illness  Aaron Daniels is a 78 y.o male with HTN, BPH s/p TURP, and a significant tobacco use history who presented to the ED with progressive bilateral LE edema and SHOB of 2 months duration. History was obtained via the patient, his wife, his son, and through chart review.   Clinical Impression  Pt admitted with above diagnosis. Pt with good overall balance and steady on his feet.  Pt was on RA on arrival with sats 81%.  Told pt he needed to keep the O2 on as he desats wihtout it.  Replaced O2 and sats were 91% on 4L.  Pt with poor endurance overall.   Pt currently with functional limitations due to the deficits listed below (see PT Problem List). Pt will benefit from skilled PT to increase their independence and safety with mobility to allow discharge to the venue listed below.      Follow Up Recommendations Home health PT    Equipment Recommendations  None recommended by PT    Recommendations for Other Services       Precautions / Restrictions Precautions Precautions: Fall Restrictions Weight Bearing Restrictions: No      Mobility  Bed Mobility Overal bed mobility: Independent                Transfers Overall transfer level: Independent               General transfer comment: Pt was not on O2 on arrival and O2 was 81%.  told pt to leave O2 on at all tiems. once replaced at 4L, sats 91%.    Ambulation/Gait Ambulation/Gait assistance: Min guard Gait Distance (Feet): 5 Feet Assistive device: 1 person hand held assist Gait Pattern/deviations: Step-through pattern;Decreased stride length   Gait velocity interpretation: <1.31 ft/sec, indicative of household ambulator General Gait Details: Pt did not want to walk far but states he just went to [place].  Suspect he went without O2.  Told pt to please keep O2 on. No overt LOB  noted.    Stairs            Wheelchair Mobility    Modified Rankin (Stroke Patients Only)       Balance Overall balance assessment: Needs assistance Sitting-balance support: No upper extremity supported;Feet supported Sitting balance-Leahy Scale: Fair     Standing balance support: No upper extremity supported;During functional activity Standing balance-Leahy Scale: Fair Standing balance comment: Pt has good balance with min to mod challenges to balance.                              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 - he cleans and cooks and does her meds     Hand Dominance        Extremity/Trunk Assessment   Upper Extremity Assessment Upper Extremity Assessment: Defer to OT evaluation  Lower Extremity Assessment Lower Extremity Assessment: Generalized weakness    Cervical / Trunk Assessment Cervical / Trunk Assessment: Normal  Communication   Communication: No difficulties  Cognition Arousal/Alertness: Awake/alert Behavior During Therapy: WFL for tasks assessed/performed Overall Cognitive Status: Within Functional Limits for tasks assessed                                        General Comments      Exercises     Assessment/Plan    PT Assessment Patient needs continued PT services  PT Problem List Decreased activity tolerance;Decreased mobility;Cardiopulmonary status limiting activity       PT  Treatment Interventions Gait training;DME instruction;Stair training;Functional mobility training;Therapeutic activities;Therapeutic exercise;Balance training;Patient/family education    PT Goals (Current goals can be found in the Care Plan section)  Acute Rehab PT Goals Patient Stated Goal: to go home PT Goal Formulation: With patient Time For Goal Achievement: 09/18/19 Potential to Achieve Goals: Good    Frequency Min 3X/week   Barriers to discharge Decreased caregiver support      Co-evaluation               AM-PAC PT "6 Clicks" Mobility  Outcome Measure Help needed turning from your back to your side while in a flat bed without using bedrails?: None Help needed moving from lying on your back to sitting on the side of a flat bed without using bedrails?: None Help needed moving to and from a bed to a chair (including a wheelchair)?: None Help needed standing up from a chair using your arms (e.g., wheelchair or bedside chair)?: None Help needed to walk in hospital room?: A Little Help needed climbing 3-5 steps with a railing? : A Little 6 Click Score: 22    End of Session Equipment Utilized During Treatment: Gait belt;Oxygen Activity Tolerance: Patient limited by fatigue Patient left: with call bell/phone within reach;in bed Nurse Communication: Mobility status PT Visit Diagnosis: Muscle weakness (generalized) (M62.81)    Time: 9476-5465 PT Time Calculation (min) (ACUTE ONLY): 14 min   Charges:   PT Evaluation $PT Eval Moderate Complexity: 1 Mod          Apostolos Blagg,PT Acute Rehabilitation Services Pager:  [phone]  Office:  [phone]    Berline Lopes 09/04/2019, 2:26 PM

## 2019-09-04 NOTE — ED Notes (Signed)
Pt wheezing decreased. O2 sat 94% on 4L Aaron Daniels

## 2019-09-04 NOTE — ED Notes (Signed)
SDU 

## 2019-09-04 NOTE — Progress Notes (Signed)
   Subjective: Pt seen at the bedside on rounds this AM. Patient states he feels better since admission to the hospital. He reports that he has been short of breath for months; however, he has had increasing shortness of breath for the last 2 weeks.    Objective:  Vital signs in last 24 hours: Vitals:   09/04/19 0800 09/04/19 0813 09/04/19 1048 09/04/19 1152  BP: 120/77 120/77 128/78   Pulse: 97 (!) 108 (!) 110   Resp:  16 (!) 24 20  Temp:      TempSrc:      SpO2: 94% 94% 95%    Physical Exam: General NAD, Alert and oriented  Cardiac RRR, no m/r/g  Pulmonary Mild crackles and decreased breath sounds bilaterally, no wheezing  Abdominal Non-distended, non-tender, no fluid wave present  Neuro No asterixis  Extremities 2+ bilateral lower extremity edema up to knees bilaterally   Assessment/Plan:  Active Problems:   Acute respiratory failure with hypoxia (HCC)   Dyspnea  Aaron Daniels is an 78yo male with history of COPD, peripheral edema, HTN, BPH s/p TURP, and a significant tobacco use history who presented with progressive bilateral LE edema and dyspnea that is concerning for acute CHF.  # Acute Respiratory Failure with Hypoxia  Volume Overload: Patient remains volume overloaded. Urine output 2.2L since admission with net negative 2L. Cause of ARF w/hypoxia appears to be acute CHF. Chest x-ray on admission revealed pulmonary vascular congestion not present on chest x-ray in August that is consistent with right heart failure. Patient's extensive smoking history puts him at risk for cor pulmonale. Shortness of breath improved since admission 2/2 to diuresis and duonebs. Other causes of volume overload (liver disease, renal disease) appear less likely given the normal transaminases, APTT, INR and creatinine. Underlying COPD likely contributing to current presentation and shortness of breath. COPD exacerbation remains on differential; this appears less likely given volume overload (lower  extremity edema, crackles). If echocardiogram is not consistent with CHF, aggressive treatment of COPD exacerbation will be pursued in addition to workup of lower extremity edema. - Echocardiogram - Start IV Furosemide 80 mg daily-> IV Furosemide 80mg  BID 09/25 if urine output <2567mL in 24hrs - Duonebs for potential COPD involvement - Strict I/Os - Daily weights - 2 liters supplemental O2, titrate down as tolerated   # Iron Deficiency Anemia: Patient's Hgb 12.6 on admission with Ferritin of 22. This is consistent with mild iron deficiency anemia. Given likely acute CHF will replete with IV iron. -IV Feraheme 510mg  once  Diet: Heart healthy diet with fluid restriction VTE ppx: Enoxaparin 40 mg CODE STATUS: DNR  Dispo: Admit patient to Inpatient with expected length of stay greater than 2 midnights.   LOS: 1 day   Flonnie Hailstone, Medical Student 09/04/2019, 1:08 PM

## 2019-09-04 NOTE — ED Notes (Signed)
Dr into see pt Aaron Daniels

## 2019-09-04 NOTE — ED Notes (Signed)
RN walked into room, pt did not have [place] or O2 monitor on. RN educated pt about keeping [place] and O2 monitor on. Pt sat at 81% on RA. Sat quickly came up when placed back on [place].

## 2019-09-04 NOTE — ED Notes (Addendum)
Pt wheezing, increased SOB, increase need for [place] O2. Pt placed on 6L . Pt also tachy at rest. Internal med MD called. MD placing orders now.

## 2019-09-04 NOTE — ED Notes (Signed)
Pt sitting up on side of bed to eat

## 2019-09-04 NOTE — ED Notes (Signed)
ED TO INPATIENT HANDOFF REPORT  ED Nurse Name and Phone #: [phone]  S Name/Age/Gender Aaron Daniels 78 y.o. male Room/Bed: 011C/011C  Code Status   Code Status: DNR  Home/SNF/Other Home Patient oriented to: self, place, time and situation Is this baseline? Yes   Triage Complete: Triage complete  Chief Complaint Dizzy, Fluid build up  Triage Note Patient complains of increased SOB and edema. States that he was recently admitted for same and not feeling any better. Using inhaler and diuretics with minimal relief. Congested on assessment. Placed on oxygen on arrival. Non-smoker sats 85% on arrival, placed on oxygen at 2l.  sats now 98 on 2l   Allergies Allergies  Allergen Reactions  . Codeine   . Penicillins   . Sulfa Antibiotics     Level of Care/Admitting Diagnosis ED Disposition    ED Disposition Condition Comment   Admit  Hospital Area: [facility] [[id number]]  Level of Care: Telemetry Cardiac [103]  Covid Evaluation: Confirmed COVID Negative  Diagnosis: Dyspnea [[id number]]  Admitting Physician: Earl Lagos [[id number]]  Attending Physician: Earl Lagos [id number]  Estimated length of stay: past midnight tomorrow  Certification:: I certify this patient will need inpatient services for at least 2 midnights  PT Class (Do Not Modify): Inpatient [101]  PT Acc Code (Do Not Modify): Private [1]       B Medical/Surgery History Past Medical History:  Diagnosis Date  . COPD (chronic obstructive pulmonary disease) (HCC)   . Hypertension    History reviewed. No pertinent surgical history.   A IV Location/Drains/Wounds Patient Lines/Drains/Airways Status   Active Line/Drains/Airways    Name:   Placement date:   Placement time:   Site:   Days:   Peripheral IV 08/14/19 Left Antecubital   08/14/19    6295    Antecubital   21   Peripheral IV 09/03/19 Right Antecubital   09/03/19    1804    Antecubital   1          Intake/Output Last 24  hours  Intake/Output Summary (Last 24 hours) at 09/04/2019 1028 Last data filed at 09/04/2019 2841 Gross per 24 hour  Intake -  Output 1400 ml  Net -1400 ml    Labs/Imaging Results for orders placed or performed during the hospital encounter of 09/03/19 (from the past 48 hour(s))  Urinalysis, Routine w reflex microscopic     Status: None   Collection Time: 09/03/19  1:37 AM  Result Value Ref Range   Color, Urine YELLOW YELLOW   APPearance CLEAR CLEAR   Specific Gravity, Urine 1.008 1.005 - 1.030   pH 7.0 5.0 - 8.0   Glucose, UA NEGATIVE NEGATIVE mg/dL   Hgb urine dipstick NEGATIVE NEGATIVE   Bilirubin Urine NEGATIVE NEGATIVE   Ketones, ur NEGATIVE NEGATIVE mg/dL   Protein, ur NEGATIVE NEGATIVE mg/dL   Nitrite NEGATIVE NEGATIVE   Leukocytes,Ua NEGATIVE NEGATIVE    Comment: Performed at [facility], [address], [place], [place] [address]  CBC with Differential     Status: Abnormal   Collection Time: 09/03/19  5:05 PM  Result Value Ref Range   WBC 5.4 4.0 - 10.5 K/uL   RBC 4.23 4.22 - 5.81 MIL/uL   Hemoglobin 12.5 (L) 13.0 - 17.0 g/dL   HCT 10.2 72.5 - 36.6 %   MCV 95.3 80.0 - 100.0 fL   MCH 29.6 26.0 - 34.0 pg   MCHC 31.0 30.0 - 36.0 g/dL   RDW 44.0 34.7 -  15.5 %   Platelets 184 150 - 400 K/uL   nRBC 0.0 0.0 - 0.2 %   Neutrophils Relative % 76 %   Neutro Abs 4.1 1.7 - 7.7 K/uL   Lymphocytes Relative 13 %   Lymphs Abs 0.7 0.7 - 4.0 K/uL   Monocytes Relative 7 %   Monocytes Absolute 0.4 0.1 - 1.0 K/uL   Eosinophils Relative 3 %   Eosinophils Absolute 0.2 0.0 - 0.5 K/uL   Basophils Relative 0 %   Basophils Absolute 0.0 0.0 - 0.1 K/uL   Immature Granulocytes 1 %   Abs Immature Granulocytes 0.03 0.00 - 0.07 K/uL    Comment: Performed at [facility] [address], [place], [place] [address]  Comprehensive metabolic panel     Status: Abnormal   Collection Time: 09/03/19  5:05 PM  Result Value Ref Range   Sodium 137 135 - 145 mmol/L   Potassium 4.3 3.5  - 5.1 mmol/L   Chloride 96 (L) 98 - 111 mmol/L   CO2 31 22 - 32 mmol/L   Glucose, Bld 125 (H) 70 - 99 mg/dL   BUN 14 8 - 23 mg/dL   Creatinine, Ser 1.02 0.61 - 1.24 mg/dL   Calcium 8.5 (L) 8.9 - 10.3 mg/dL   Total Protein 5.9 (L) 6.5 - 8.1 g/dL   Albumin 3.1 (L) 3.5 - 5.0 g/dL   AST 20 15 - 41 U/L   ALT 24 0 - 44 U/L   Alkaline Phosphatase 60 38 - 126 U/L   Total Bilirubin 0.4 0.3 - 1.2 mg/dL   GFR calc non Af Amer >60 >60 mL/min   GFR calc Af Amer >60 >60 mL/min   Anion gap 10 5 - 15    Comment: Performed at [facility], [place] [address] [place], [place] [address]  Troponin I (High Sensitivity)     Status: None   Collection Time: 09/03/19  5:05 PM  Result Value Ref Range   Troponin I (High Sensitivity) 9 <18 ng/L    Comment: (NOTE) Elevated high sensitivity troponin I (hsTnI) values and significant  changes across serial measurements may suggest ACS but many other  chronic and acute conditions are known to elevate hsTnI results.  Refer to the Links section for chest pain algorithms and additional  guidance. Performed at [facility], [place] [address] [place], [place] [address]   Brain natriuretic peptide     Status: None   Collection Time: 09/03/19  5:26 PM  Result Value Ref Range   B Natriuretic Peptide 85.2 0.0 - 100.0 pg/mL    Comment: Performed at [facility] [address], [place],  [address]  SARS Coronavirus 2 [facility] order, Performed in [facility] hospital lab) Nasopharyngeal Nasopharyngeal Swab     Status: None   Collection Time: 09/03/19  6:20 PM   Specimen: Nasopharyngeal Swab  Result Value Ref Range   SARS Coronavirus 2 NEGATIVE NEGATIVE    Comment: (NOTE) If result is NEGATIVE SARS-CoV-2 target nucleic acids are NOT DETECTED. The SARS-CoV-2 RNA is generally detectable in upper and lower  respiratory specimens during the acute phase of infection. The lowest  concentration of SARS-CoV-2 viral copies this assay can detect is 250   copies / mL. A negative result does not preclude SARS-CoV-2 infection  and should not be used as the sole basis for treatment or other  patient management decisions.  A negative result may occur with  improper specimen  collection / handling, submission of specimen other  than nasopharyngeal swab, presence of viral mutation(s) within the  areas targeted by this assay, and inadequate number of viral copies  (<250 copies / mL). A negative result must be combined with clinical  observations, patient history, and epidemiological information. If result is POSITIVE SARS-CoV-2 target nucleic acids are DETECTED. The SARS-CoV-2 RNA is generally detectable in upper and lower  respiratory specimens dur ing the acute phase of infection.  Positive  results are indicative of active infection with SARS-CoV-2.  Clinical  correlation with patient history and other diagnostic information is  necessary to determine patient infection status.  Positive results do  not rule out bacterial infection or co-infection with other viruses. If result is PRESUMPTIVE POSTIVE SARS-CoV-2 nucleic acids MAY BE PRESENT.   A presumptive positive result was obtained on the submitted specimen  and confirmed on repeat testing.  While 2019 novel coronavirus  (SARS-CoV-2) nucleic acids may be present in the submitted sample  additional confirmatory testing may be necessary for epidemiological  and / or clinical management purposes  to differentiate between  SARS-CoV-2 and other Sarbecovirus currently known to infect humans.  If clinically indicated additional testing with an alternate test  methodology [phone]) is advised. The SARS-CoV-2 RNA is generally  detectable in upper and lower respiratory sp ecimens during the acute  phase of infection. The expected result is Negative. Fact Sheet for Patients:  BoilerBrush.com.cy Fact Sheet for Healthcare  Providers: https://pope.com/ This test is not yet approved or cleared by the [place] FDA and has been authorized for detection and/or diagnosis of SARS-CoV-2 by FDA under an Emergency Use Authorization (EUA).  This EUA will remain in effect (meaning this test can be used) for the duration of the COVID-19 declaration under Section 564(b)(1) of the Act, 21 U.S.C. section 360bbb-3(b)(1), unless the authorization is terminated or revoked sooner. Performed at [facility], [address] [place], [place] [address]   POCT I-Stat EG7     Status: Abnormal   Collection Time: 09/03/19  6:33 PM  Result Value Ref Range   pH, Ven 7.362 7.250 - 7.430   pCO2, Ven 62.0 (H) 44.0 - 60.0 mmHg   pO2, Ven 80.0 (H) 32.0 - 45.0 mmHg   Bicarbonate 35.2 (H) 20.0 - 28.0 mmol/L   TCO2 37 (H) 22 - 32 mmol/L   O2 Saturation 95.0 %   Acid-Base Excess 8.0 (H) 0.0 - 2.0 mmol/L   Sodium 136 135 - 145 mmol/L   Potassium 4.3 3.5 - 5.1 mmol/L   Calcium, Ion 1.13 (L) 1.15 - 1.40 mmol/L   HCT 37.0 (L) 39.0 - 52.0 %   Hemoglobin 12.6 (L) 13.0 - 17.0 g/dL   Patient temperature HIDE    Sample type VENOUS   Basic metabolic panel     Status: Abnormal   Collection Time: 09/04/19  4:58 AM  Result Value Ref Range   Sodium 140 135 - 145 mmol/L   Potassium 4.4 3.5 - 5.1 mmol/L   Chloride 96 (L) 98 - 111 mmol/L   CO2 34 (H) 22 - 32 mmol/L   Glucose, Bld 191 (H) 70 - 99 mg/dL   BUN 15 8 - 23 mg/dL   Creatinine, Ser 4.78 0.61 - 1.24 mg/dL   Calcium 8.6 (L) 8.9 - 10.3 mg/dL   GFR calc non Af Amer >60 >60 mL/min   GFR calc Af Amer >60 >60 mL/min   Anion gap 10 5 - 15  Comment: Performed at [facility], [address] [place], [place] [address]  Magnesium     Status: None   Collection Time: 09/04/19  4:58 AM  Result Value Ref Range   Magnesium 2.0 1.7 - 2.4 mg/dL    Comment: Performed at [facility], [address] [place], [place] [address]  Iron and TIBC      Status: Abnormal   Collection Time: 09/04/19  4:58 AM  Result Value Ref Range   Iron 38 (L) 45 - 182 ug/dL   TIBC 098399 119250 - 147450 ug/dL   Saturation Ratios 10 (L) 17.9 - 39.5 %   UIBC 361 ug/dL    Comment: Performed at [facility], [address] [place], [place] [address]  Ferritin     Status: Abnormal   Collection Time: 09/04/19  4:58 AM  Result Value Ref Range   Ferritin 22 (L) 24 - 336 ng/mL    Comment: Performed at [facility], [address] [place], [place] [address]  Protime-INR     Status: None   Collection Time: 09/04/19  8:44 AM  Result Value Ref Range   Prothrombin Time 13.6 11.4 - 15.2 seconds   INR 1.1 0.8 - 1.2    Comment: (NOTE) INR goal varies based on device and disease states. Performed at [facility], [address] [place], [place] [address]   APTT     Status: None   Collection Time: 09/04/19  8:44 AM  Result Value Ref Range   aPTT 30 24 - 36 seconds    Comment: Performed at [facility], [address] [place], [place] [address]   Dg Chest Port 1 View  Result Date: 09/03/2019 CLINICAL DATA:  Shortness of breath and cough.  Body swelling. EXAM: PORTABLE CHEST 1 VIEW COMPARISON:  08/14/2019 FINDINGS: Heart size upper limits of normal. Chronic aortic atherosclerosis. Possible venous hypertension without frank edema. Interstitial markings at the lung bases most consistent with interstitial edema. Similar appearance to the most previous exam. IMPRESSION: Pulmonary venous hypertension and mild interstitial edema, similar to the study of 08/14/2019. Electronically Signed   By: Paulina FusiMark  Shogry M.D.   On: 09/03/2019 17:56    Pending Labs Unresulted Labs (From admission, onward)   None      Vitals/Pain Today's Vitals   09/04/19 0615 09/04/19 0800 09/04/19 0813 09/04/19 0819  BP: 120/68 120/77 120/77   Pulse: (!) 105 97 (!) 108   Resp: (!) 29  16   Temp:      TempSrc:      SpO2: 92% 94% 94%   PainSc:    3     Isolation  Precautions No active isolations  Medications Medications  albuterol (PROVENTIL) (2.5 MG/3ML) 0.083% nebulizer solution 3 mL (3 mLs Inhalation Given 09/04/19 0325)  enoxaparin (LOVENOX) injection 40 mg (has no administration in time range)  acetaminophen (TYLENOL) tablet 650 mg (has no administration in time range)    Or  acetaminophen (TYLENOL) suppository 650 mg (has no administration in time range)  ondansetron (ZOFRAN) tablet 4 mg (has no administration in time range)    Or  ondansetron (ZOFRAN) injection 4 mg (has no administration in time range)  ipratropium-albuterol (DUONEB) 0.5-2.5 (3) MG/3ML nebulizer solution 3 mL (3 mLs Nebulization Given 09/04/19 0024)  furosemide (LASIX) injection 80 mg (has no administration in time range)  albuterol (VENTOLIN HFA) 108 (90 Base) MCG/ACT inhaler 8 puff (8 puffs Inhalation Given 09/03/19  1806)  aerochamber plus with mask device 1 each (1 each Other Given 09/03/19 2020)  methylPREDNISolone sodium succinate (SOLU-MEDROL) 125 mg/2 mL injection 125 mg (125 mg Intravenous Given 09/03/19 1807)  magnesium sulfate IVPB 2 g 50 mL (0 g Intravenous Stopped 09/03/19 1931)  furosemide (LASIX) injection 40 mg (40 mg Intravenous Given 09/03/19 1818)  albuterol (VENTOLIN HFA) 108 (90 Base) MCG/ACT inhaler 4 puff (4 puffs Inhalation Given 09/03/19 2020)    Mobility walks with device High fall risk   Focused Assessments Cardiac Assessment Handoff:    No results found for: CKTOTAL, CKMB, CKMBINDEX, TROPONINI No results found for: DDIMER Does the Patient currently have chest pain? No      R Recommendations: See Admitting Provider Note  Report given to:   Additional Notes: pt is on hospital bed

## 2019-09-04 NOTE — ED Notes (Signed)
Breakfast Ordered 

## 2019-09-04 NOTE — ED Notes (Signed)
Pt continues to wheeze at rest. Pt still needing 6L [place] to maintain O2 sat at 91-93%. MD notified. MD told RN to give pt albuterol ned slightly early.

## 2019-09-05 ENCOUNTER — Inpatient Hospital Stay (HOSPITAL_COMMUNITY): Payer: Medicare Other

## 2019-09-05 DIAGNOSIS — J9602 Acute respiratory failure with hypercapnia: Secondary | ICD-10-CM

## 2019-09-05 DIAGNOSIS — J449 Chronic obstructive pulmonary disease, unspecified: Secondary | ICD-10-CM

## 2019-09-05 DIAGNOSIS — J9601 Acute respiratory failure with hypoxia: Secondary | ICD-10-CM

## 2019-09-05 DIAGNOSIS — R4182 Altered mental status, unspecified: Secondary | ICD-10-CM

## 2019-09-05 LAB — CBC
HCT: 39.7 % (ref 39.0–52.0)
Hemoglobin: 12.3 g/dL — ABNORMAL LOW (ref 13.0–17.0)
MCH: 29.8 pg (ref 26.0–34.0)
MCHC: 31 g/dL (ref 30.0–36.0)
MCV: 96.1 fL (ref 80.0–100.0)
Platelets: 191 10*3/uL (ref 150–400)
RBC: 4.13 MIL/uL — ABNORMAL LOW (ref 4.22–5.81)
RDW: 14.6 % (ref 11.5–15.5)
WBC: 6.7 10*3/uL (ref 4.0–10.5)
nRBC: 0 % (ref 0.0–0.2)

## 2019-09-05 LAB — COMPREHENSIVE METABOLIC PANEL
ALT: 21 U/L (ref 0–44)
AST: 17 U/L (ref 15–41)
Albumin: 2.9 g/dL — ABNORMAL LOW (ref 3.5–5.0)
Alkaline Phosphatase: 55 U/L (ref 38–126)
Anion gap: 9 (ref 5–15)
BUN: 22 mg/dL (ref 8–23)
CO2: 39 mmol/L — ABNORMAL HIGH (ref 22–32)
Calcium: 8.4 mg/dL — ABNORMAL LOW (ref 8.9–10.3)
Chloride: 94 mmol/L — ABNORMAL LOW (ref 98–111)
Creatinine, Ser: 1.11 mg/dL (ref 0.61–1.24)
GFR calc Af Amer: >60 mL/min (ref >60)
GFR calc non Af Amer: >60 mL/min (ref >60)
Glucose, Bld: 112 mg/dL — ABNORMAL HIGH (ref 70–99)
Potassium: 3.7 mmol/L (ref 3.5–5.1)
Sodium: 142 mmol/L (ref 135–145)
Total Bilirubin: 0.7 mg/dL (ref 0.3–1.2)
Total Protein: 5.4 g/dL — ABNORMAL LOW (ref 6.5–8.1)

## 2019-09-05 LAB — GLUCOSE, CAPILLARY
Glucose-Capillary: 131 mg/dL — ABNORMAL HIGH (ref 70–99)
Glucose-Capillary: 192 mg/dL — ABNORMAL HIGH (ref 70–99)
Glucose-Capillary: 153 mg/dL — ABNORMAL HIGH (ref 70–99)

## 2019-09-05 LAB — MAGNESIUM: Magnesium: 2.2 mg/dL (ref 1.7–2.4)

## 2019-09-05 LAB — ECHOCARDIOGRAM COMPLETE: Weight: 3594.38 oz

## 2019-09-05 MED ORDER — PREDNISONE 20 MG PO TABS: 40.0000 mg | ORAL_TABLET | Freq: Every day | ORAL | Status: DC

## 2019-09-05 MED ORDER — IOHEXOL 350 MG/ML SOLN
100.0000 mL | Freq: Once | INTRAVENOUS | Status: AC | PRN
  Administered 2019-09-05: 66 mL via INTRAVENOUS

## 2019-09-05 MED ORDER — PREDNISONE 20 MG PO TABS
40.0000 mg | ORAL_TABLET | Freq: Every day | ORAL | Status: DC
  Administered 2019-09-06 – 2019-09-08 (×3): 40 mg via ORAL
  Filled 2019-09-05 (×3): qty 2

## 2019-09-05 MED ORDER — AZITHROMYCIN 250 MG PO TABS
500.0000 mg | ORAL_TABLET | Freq: Every day | ORAL | Status: AC
  Administered 2019-09-05: 500 mg via ORAL
  Filled 2019-09-05: qty 2

## 2019-09-05 MED ORDER — AZITHROMYCIN 250 MG PO TABS
250.0000 mg | ORAL_TABLET | Freq: Every day | ORAL | Status: DC
  Administered 2019-09-06 – 2019-09-08 (×3): 250 mg via ORAL
  Filled 2019-09-05 (×3): qty 1

## 2019-09-05 MED ORDER — METHYLPREDNISOLONE SODIUM SUCC 125 MG IJ SOLR
125.0000 mg | Freq: Once | INTRAMUSCULAR | Status: AC
  Administered 2019-09-05: 125 mg via INTRAVENOUS
  Filled 2019-09-05: qty 2

## 2019-09-05 NOTE — Progress Notes (Signed)
Echocardiogram 2D Echocardiogram has been performed.  Oneal Deputy Malajah Oceguera 09/05/2019, 8:39 AM

## 2019-09-05 NOTE — Progress Notes (Addendum)
   09/05/19 0935  Vitals  BP 123/81  MAP (mmHg) 95  ECG Heart Rate (!) 106  Resp 20  Oxygen Therapy  SpO2 (!) 85 %  O2 Device Nasal Cannula  O2 Flow Rate (L/min) 6 L/min  Patient Activity (if Appropriate) Ambulating (Bathroom at CT scan)  MEWS Score  MEWS RR 0  MEWS Pulse 1  MEWS Systolic 0  MEWS LOC 0  MEWS Temp 0  MEWS Score 1  MEWS Score Color Green   Pt went to the restroom in CT, stats dropped into the ~75 on 6L. Patient had not recovered by the time he returned to the unit as seen above. RN placed patient back on Bipap and reeducated about no intake with mask in place. RN notified RRT with update and left patient to rest with stats between ~88- 91. I will continue to monitor the patient closely.   Saddie Benders RN BSN

## 2019-09-05 NOTE — Progress Notes (Signed)
RT walked into room to PT sitting up and talking. RT took patient off of BiPAP and placed on 5L [place]. With a HR 104 Sat of 95-92 and a RR of 22-15. RT Will monitor patient throughout the night.

## 2019-09-05 NOTE — Progress Notes (Signed)
I spoke with Judie Grieve, patient's son. [name] states Nakota had progressive weakness in the past 1-2 weeks to the point where he couldn't function and live his day to day life. For the last 1-2 years, Montrey hasn't "felt right." He reports that Jakobie has been falling asleep sitting up for over a month unintentionally. He states he also snores a lot and talks in his sleep.  Payton Prinsen [phone]

## 2019-09-05 NOTE — Progress Notes (Addendum)
Physical Therapy Treatment Patient Details Name: Aaron Daniels MRN: [id number] DOB: 1941-11-10 Today's Date: 09/05/2019    History of Present Illness Aaron Daniels is a 78 y.o male with HTN, BPH s/p TURP, and a significant tobacco use history who presented to the ED with progressive bilateral LE edema and SHOB of 2 months duration. History was obtained via the patient, his wife, his son, and through chart review.     PT Comments    Pt on BiPAP but very eager to participate in therapy. Pt independent for bed mobility and transfers. Given the hose length pt marched in place for 5 minutes, and performed standing exercise.  Pt able to maintain SaO2 on BiPAP >89%O2 throughout session. D/c plans remain appropriate. PT will continue to follow acutely.   Follow Up Recommendations  Home health PT     Equipment Recommendations  None recommended by PT       Precautions / Restrictions Precautions Precautions: Fall Restrictions Weight Bearing Restrictions: No    Mobility  Bed Mobility Overal bed mobility: Independent                Transfers Overall transfer level: Independent               General transfer comment: Pt was not on O2 on arrival and O2 was 81%.  told pt to leave O2 on at all tiems. once replaced at 4L, sats 91%.    Ambulation/Gait Ambulation/Gait assistance: Supervision Gait Distance (Feet): 2 Feet Assistive device: 1 person hand held assist Gait Pattern/deviations: Step-through pattern;Decreased stride length     General Gait Details: unable to progress ambulation today due to pt on BiPAP       Balance Overall balance assessment: Needs assistance Sitting-balance support: No upper extremity supported;Feet supported Sitting balance-Leahy Scale: Fair     Standing balance support: No upper extremity supported;During functional activity Standing balance-Leahy Scale: Fair Standing balance comment: Pt has good balance with min to mod challenges to balance.                              Cognition Arousal/Alertness: Awake/alert Behavior During Therapy: WFL for tasks assessed/performed Overall Cognitive Status: Within Functional Limits for tasks assessed                                        Exercises Total Joint Exercises Hip ABduction/ADduction: AROM;Both;10 reps;Standing Long Arc Quad: AROM;Both;10 reps;Seated Knee Flexion: AROM;Both;10 reps;Standing Standing Hip Extension: AROM;Both;10 reps;Standing Other Exercises Other Exercises: marching in place 5 minutes     General Comments General comments (skin integrity, edema, etc.): on BiPAP and able to maintain SaO2 >89%O2 throughout standing exercises      Pertinent Vitals/Pain Pain Assessment: No/denies pain           PT Goals (current goals can now be found in the care plan section) Acute Rehab PT Goals Patient Stated Goal: to go home PT Goal Formulation: With patient Time For Goal Achievement: 09/18/19 Potential to Achieve Goals: Good    Frequency    Min 3X/week      PT Plan Current plan remains appropriate       AM-PAC PT "6 Clicks" Mobility   Outcome Measure  Help needed turning from your back to your side while in a flat bed without using bedrails?: None Help needed moving from lying on  your back to sitting on the side of a flat bed without using bedrails?: None Help needed moving to and from a bed to a chair (including a wheelchair)?: None Help needed standing up from a chair using your arms (e.g., wheelchair or bedside chair)?: None Help needed to walk in hospital room?: A Little Help needed climbing 3-5 steps with a railing? : A Little 6 Click Score: 22    End of Session Equipment Utilized During Treatment: Gait belt;Oxygen Activity Tolerance: Patient limited by fatigue Patient left: with call bell/phone within reach;in bed Nurse Communication: Mobility status PT Visit Diagnosis: Muscle weakness (generalized) (M62.81)      Time: 6720-9470 PT Time Calculation (min) (ACUTE ONLY): 20 min  Charges:  $Therapeutic Exercise: 8-22 mins                     Aaron Daniels B. Migdalia Dk PT, DPT Acute Rehabilitation Services Pager [phone] Office [phone]    [place] 09/05/2019, 3:54 PM

## 2019-09-05 NOTE — Progress Notes (Signed)
Paged to the room regarding patient with AMS.  Patient initially seemed alert and only oriented to self but otherwise disoriented.  Patient has recent required more oxygen over the last 12 hours and O2 stats have been down in the high 80s and low 90s on 5 L supplemental oxygen. Otherwise patient denies any new complaints. His hearts sound are WNL and lungs sound have improved since yesterday. He continues to have bibasilar crackles. Patient does not have any new neurologic deficits. 5/5 strength bilaterally in upper and lower extremities and cranial nerves II-XII grossly intact.  Patient's mentation slowly improved during examination with only intermittent slurred speech.  Vitals:   09/04/19 2130 09/05/19 0000  BP: 118/85   Pulse: 90 96  Resp: (!) 26 16  Temp:    SpO2: 93% 92%   I ordered an ABG which showed hypercarbia and chest x-ray which continued to show pulmonary congestion.  Patient would likely benefit from a trial of BiPAP.  The nursing staff was instructed to keep the patient's SPO2 at 88-92%.  I will reevaluate the patient after he starts BiPAP.  Marianna Payment, D.O. PGY-1, Internal Medicine Residency

## 2019-09-05 NOTE — Progress Notes (Signed)
Pt off Bi-PAP and placed on [place] 6L per RN for ECHO. Pt is laid flat in the bed and SATs are sustaining 88-92% at this time. Pt in no apparent distress at this time. RT will continue to monitor.

## 2019-09-05 NOTE — Progress Notes (Signed)
   Subjective: Pt seen at the bedside on rounds this AM. Pt sleeping comfortably with BIPAP prior to exam. Pts reports he is no longer confused and his shortness of breath is the same as the previous day.   Objective:  Vital signs in last 24 hours: Vitals:   09/05/19 0000 09/05/19 0301 09/05/19 0505 09/05/19 0832  BP:   121/79   Pulse: 96 82 76 (!) 101  Resp: 16 15 17  (!) 22  Temp:   98 F (36.7 C)   TempSrc:   Axillary   SpO2: 92% 90% 93% 90%  Weight:   101.9 kg    Physical Exam: General NAD, Alert and orientedx4  Cardiac RRR, no murmurs, rubs or gallops  Pulmonary Mild crackles and decreased breath sounds bilaterally, diffuse wheezing present bilaterally  Abdominal Non-distended, non-tender  Extremities 2+ bilateral lower extremity edema to mid-tibia   Assessment/Plan:  Active Problems:   Acute respiratory failure with hypoxia (HCC)   Dyspnea  Aaron Daniels is an 78yo male with history of COPD, peripheral edema, HTN, BPH s/p TURP, and a significant tobacco use history who presented with progressive bilateral LE edema and dyspnea that is concerning for acute CHF.  # Acute Respiratory Failure with Hypercapnia and Hypoxia: Urine output 2.3L in last 24 hours, net negative 2L. Echocardiogram showed an EF of 65-70% with no valvular disease or signs of pulmonary hypertension. This is not consistent with CHF. No further diuresis at this time as no known cause for volume overload. Overnight patient became confused. Confusion resolved by 8am 09/25. Patient states that he woke up disoriented. Oxygen saturation 90% on 6L this AM from 97% on 2L at time of admission; patient required BIPAP overnight. Cause of increased oxygen requirement likely COPD exacerbation given acute worsening despite diuresis since admission. - Discontinue furosemide given no CHF - Duonebs for increased dyspnea related to COPD  - Methylprednisolone 125mg  09/25 -> Prednisone 40mg  daily for 4 days - Azithromycin 500mg  x  1 day ->Azithromycin 250mg  x 4 days - Consider CT A&P if fluid overload worsens or lack of improvement with COPD exacerbation treatment - Strict I/Os - Daily weights - 6 liters supplemental O2, titrate as needed to target O2 of 88-92%-> BIPAP if needed  # Iron Deficiency Anemia: Patient's Hgb stable 12.6 -> 12.3. Patient will require colonoscopy outpatient due to possible GI blood loss. -CBC daily -Outpatient colonoscopy  Diet: Heart healthy diet with fluid restriction VTE ppx: Enoxaparin 40 mg CODE STATUS: DNR  Dispo: Admit patient to Inpatient with expected length of stay greater than 2 midnights.   LOS: 2 days   Flonnie Hailstone, Medical Student 09/05/2019, 9:06 AM

## 2019-09-05 NOTE — Evaluation (Signed)
Occupational Therapy Evaluation Patient Details Name: Aaron Daniels MRN: [id number] DOB: Oct 22, 1941 Today's Date: 09/05/2019    History of Present Illness Aaron Daniels is a 78 y.o male with HTN, BPH s/p TURP, and a significant tobacco use history who presented to the ED with progressive bilateral LE edema and SHOB of 2 months duration. History was obtained via the patient, his wife, his son, and through chart review.    Clinical Impression   Pt evaluation limited by Respiratory status, able to complete sefl-feeding at bed level however on 6LO2 via [place] SpO2 dropping to 82% - cues for pursed lip breathing. Pt able to complete bed level and UB grooming at bed level with set up. OT will follow acutely with focus on energy conservation and maximizing independence in ADL and functional transfers - especially as Pt is caregiver for wife. OT will follow acutely.     Follow Up Recommendations  Home health OT;Supervision - Intermittent    Equipment Recommendations  None recommended by OT(Pt has appopriate DME)    Recommendations for Other Services       Precautions / Restrictions Precautions Precautions: Fall Restrictions Weight Bearing Restrictions: No      Mobility Bed Mobility Overal bed mobility: Independent             General bed mobility comments: Pt remained in bed due to O2 saturations while on 6LO2 via   Transfers Overall transfer level: Independent               General transfer comment: NT this session due to O2 needs    Balance Overall balance assessment: Needs assistance Sitting-balance support: No upper extremity supported;Feet supported Sitting balance-Leahy Scale: Fair     Standing balance support: No upper extremity supported;During functional activity Standing balance-Leahy Scale: Fair Standing balance comment: Pt has good balance with min to mod challenges to balance.                            ADL either performed or assessed with  clinical judgement   ADL Overall ADL's : Needs assistance/impaired Eating/Feeding: Set up;Sitting Eating/Feeding Details (indicate cue type and reason): while on 6L O2 eating dinner Pt consistently desaturating to 82% Grooming: Set up;Wash/dry hands;Wash/dry face;Bed level   Upper Body Bathing: Minimal assistance;Bed level Upper Body Bathing Details (indicate cue type and reason): activity causes Pt to desaturate very quickly Lower Body Bathing: Min guard;Sitting/lateral leans   Upper Body Dressing : Set up;Sitting   Lower Body Dressing: Min guard   Toilet Transfer: Supervision/safety   Toileting- Clothing Manipulation and Hygiene: Min guard;Sit to/from stand       Functional mobility during ADLs: Min guard       Vision Baseline Vision/History: Wears glasses Patient Visual Report: No change from baseline       Perception     Praxis      Pertinent Vitals/Pain Pain Assessment: No/denies pain     Hand Dominance Right   Extremity/Trunk Assessment Upper Extremity Assessment Upper Extremity Assessment: Generalized weakness   Lower Extremity Assessment Lower Extremity Assessment: Defer to PT evaluation   Cervical / Trunk Assessment Cervical / Trunk Assessment: Normal   Communication Communication Communication: No difficulties   Cognition Arousal/Alertness: Awake/alert Behavior During Therapy: WFL for tasks assessed/performed Overall Cognitive Status: Within Functional Limits for tasks assessed  General Comments  on BiPAP and able to maintain SaO2 >89%O2 throughout standing exercises    Exercises Exercises: Other exercises;Total Joint;General Lower Extremity Total Joint Exercises Hip ABduction/ADduction: AROM;Both;10 reps;Standing Long Arc Quad: AROM;Both;10 reps;Seated Knee Flexion: AROM;Both;10 reps;Standing Standing Hip Extension: AROM;Both;10 reps;Standing Other Exercises Other Exercises: marching in  place 5 minutes    Shoulder Instructions      Home Living Family/patient expects to be discharged to:: Private residence Living Arrangements: Spouse/significant other Available Help at Discharge: Family;Other (Comment)(Pt is caregiver for wife) Type of Home: House Home Access: Stairs to enter [employer] of Steps: 2   Home Layout: One level     Bathroom Shower/Tub: Chief Strategy Officer: Standard     Home Equipment: Bedside commode;Shower seat          Prior Functioning/Environment Level of Independence: Independent        Comments: pt drives and is caregiver for wife - he cleans and cooks and does her meds        OT Problem List: Cardiopulmonary status limiting activity;Decreased activity tolerance;Decreased safety awareness;Obesity;Increased edema      OT Treatment/Interventions: Self-care/ADL training;Therapeutic exercise;Energy conservation;Therapeutic activities;Patient/family education    OT Goals(Current goals can be found in the care plan section) Acute Rehab OT Goals Patient Stated Goal: to go home and be with wife and dog OT Goal Formulation: With patient Time For Goal Achievement: 09/19/19 Potential to Achieve Goals: Good ADL Goals Pt Will Perform Grooming: with modified independence;standing Pt Will Perform Upper Body Dressing: with modified independence;sitting Pt Will Perform Lower Body Dressing: with modified independence;sit to/from stand Pt Will Transfer to Toilet: with modified independence;ambulating Pt Will Perform Toileting - Clothing Manipulation and hygiene: with modified independence;sit to/from stand Additional ADL Goal #1: Pt will verbalize 3 ways of conserving energy during ADL at independent level  OT Frequency: Min 2X/week   Barriers to D/C:    Pt is caregiver for wife, has support from son       Co-evaluation              AM-PAC OT "6 Clicks" Daily Activity     Outcome Measure Help from another  person eating meals?: None Help from another person taking care of personal grooming?: A Little Help from another person toileting, which includes using toliet, bedpan, or urinal?: A Little Help from another person bathing (including washing, rinsing, drying)?: A Little Help from another person to put on and taking off regular upper body clothing?: A Little Help from another person to put on and taking off regular lower body clothing?: A Little 6 Click Score: 19   End of Session Equipment Utilized During Treatment: Oxygen(6L via [place]) Nurse Communication: Mobility status;Precautions  Activity Tolerance: Other (comment)(limited by respiratory needs) Patient left: in bed;with call bell/phone within reach  OT Visit Diagnosis: Other abnormalities of gait and mobility (R26.89);Muscle weakness (generalized) (M62.81);Other (comment)(respiratory needs)                Time: 3536-1443 OT Time Calculation (min): 22 min Charges:  OT General Charges $OT Visit: 1 Visit OT Evaluation $OT Eval Moderate Complexity: 1 Mod OT Treatments  Sherryl Manges OTR/L Acute Rehabilitation Services Pager: [phone] Office: [phone]  Evern Bio Carollyn Etcheverry 09/05/2019, 6:13 PM

## 2019-09-06 ENCOUNTER — Inpatient Hospital Stay (HOSPITAL_COMMUNITY): Payer: Medicare Other

## 2019-09-06 DIAGNOSIS — Q211 Atrial septal defect: Secondary | ICD-10-CM

## 2019-09-06 DIAGNOSIS — J9601 Acute respiratory failure with hypoxia: Secondary | ICD-10-CM

## 2019-09-06 LAB — GLUCOSE, CAPILLARY
Glucose-Capillary: 152 mg/dL — ABNORMAL HIGH (ref 70–99)
Glucose-Capillary: 172 mg/dL — ABNORMAL HIGH (ref 70–99)
Glucose-Capillary: 114 mg/dL — ABNORMAL HIGH (ref 70–99)
Glucose-Capillary: 212 mg/dL — ABNORMAL HIGH (ref 70–99)

## 2019-09-06 LAB — CBC
HCT: 40.4 % (ref 39.0–52.0)
Hemoglobin: 12.3 g/dL — ABNORMAL LOW (ref 13.0–17.0)
MCH: 28.6 pg (ref 26.0–34.0)
MCHC: 30.4 g/dL (ref 30.0–36.0)
MCV: 94 fL (ref 80.0–100.0)
Platelets: 236 10*3/uL (ref 150–400)
RBC: 4.3 MIL/uL (ref 4.22–5.81)
RDW: 14.6 % (ref 11.5–15.5)
WBC: 9 10*3/uL (ref 4.0–10.5)
nRBC: 0 % (ref 0.0–0.2)

## 2019-09-06 LAB — ECHOCARDIOGRAM LIMITED: Weight: 3590.85 oz

## 2019-09-06 LAB — COMPREHENSIVE METABOLIC PANEL
ALT: 22 U/L (ref 0–44)
AST: 18 U/L (ref 15–41)
Albumin: 2.9 g/dL — ABNORMAL LOW (ref 3.5–5.0)
Alkaline Phosphatase: 56 U/L (ref 38–126)
Anion gap: 10 (ref 5–15)
BUN: 32 mg/dL — ABNORMAL HIGH (ref 8–23)
CO2: 39 mmol/L — ABNORMAL HIGH (ref 22–32)
Calcium: 8.7 mg/dL — ABNORMAL LOW (ref 8.9–10.3)
Chloride: 92 mmol/L — ABNORMAL LOW (ref 98–111)
Creatinine, Ser: 1.24 mg/dL (ref 0.61–1.24)
GFR calc Af Amer: >60 mL/min (ref >60)
GFR calc non Af Amer: 55 mL/min — ABNORMAL LOW (ref >60)
Glucose, Bld: 128 mg/dL — ABNORMAL HIGH (ref 70–99)
Potassium: 4 mmol/L (ref 3.5–5.1)
Sodium: 141 mmol/L (ref 135–145)
Total Bilirubin: 0.6 mg/dL (ref 0.3–1.2)
Total Protein: 5.7 g/dL — ABNORMAL LOW (ref 6.5–8.1)

## 2019-09-06 LAB — BLOOD GAS, ARTERIAL
Acid-Base Excess: 14 mmol/L — ABNORMAL HIGH (ref 0.0–2.0)
Bicarbonate: 39.3 mmol/L — ABNORMAL HIGH (ref 20.0–28.0)
Drawn by: 246861
O2 Content: 4 L/min
O2 Saturation: 95.9 %
Patient temperature: 98.6
pCO2 arterial: 62.3 mmHg — ABNORMAL HIGH (ref 32.0–48.0)
pH, Arterial: 7.416 (ref 7.350–7.450)
pO2, Arterial: 80.2 mmHg — ABNORMAL LOW (ref 83.0–108.0)

## 2019-09-06 MED ORDER — IPRATROPIUM BROMIDE 0.02 % IN SOLN
0.5000 mg | Freq: Four times a day (QID) | RESPIRATORY_TRACT | Status: DC
  Administered 2019-09-06 – 2019-09-07 (×5): 0.5 mg via RESPIRATORY_TRACT
  Filled 2019-09-06 (×5): qty 2.5

## 2019-09-06 MED ORDER — UMECLIDINIUM-VILANTEROL 62.5-25 MCG/INH IN AEPB
1.0000 | INHALATION_SPRAY | Freq: Every day | RESPIRATORY_TRACT | Status: DC
  Filled 2019-09-06: qty 14

## 2019-09-06 MED ORDER — TECHNETIUM TO 99M ALBUMIN AGGREGATED
1.6000 | Freq: Once | INTRAVENOUS | Status: AC | PRN
  Administered 2019-09-06: 10:00:00 1.6 via INTRAVENOUS

## 2019-09-06 MED ORDER — ARFORMOTEROL TARTRATE 15 MCG/2ML IN NEBU
15.0000 ug | INHALATION_SOLUTION | Freq: Two times a day (BID) | RESPIRATORY_TRACT | Status: DC
  Administered 2019-09-06 – 2019-09-08 (×4): 15 ug via RESPIRATORY_TRACT
  Filled 2019-09-06 (×4): qty 2

## 2019-09-06 MED ORDER — BUDESONIDE 0.5 MG/2ML IN SUSP: 0.5000 mg | Freq: Two times a day (BID) | RESPIRATORY_TRACT | Status: DC

## 2019-09-06 MED ORDER — TECHNETIUM TC 99M DIETHYLENETRIAME-PENTAACETIC ACID
39.3000 | Freq: Once | INTRAVENOUS | Status: AC | PRN
  Administered 2019-09-06: 10:00:00 39.3 via RESPIRATORY_TRACT

## 2019-09-06 MED ORDER — BUDESONIDE 0.5 MG/2ML IN SUSP
0.5000 mg | Freq: Two times a day (BID) | RESPIRATORY_TRACT | Status: DC
  Administered 2019-09-06 – 2019-09-08 (×4): 0.5 mg via RESPIRATORY_TRACT
  Filled 2019-09-06 (×4): qty 2

## 2019-09-06 MED ORDER — ARFORMOTEROL TARTRATE 15 MCG/2ML IN NEBU: 15.0000 ug | INHALATION_SOLUTION | Freq: Two times a day (BID) | RESPIRATORY_TRACT | Status: DC

## 2019-09-06 NOTE — Consult Note (Signed)
NAME:  Aaron Daniels, MRN:  [id number], DOB:  [birth year], LOS: 3 ADMISSION DATE:  09/03/2019, CONSULTATION DATE:  9/26  REFERRING MD: Dareen Piano, CHIEF COMPLAINT:  Dyspnea    Brief History   78 year old prior smoker admitted on 9/23 with what appeared to be acute on chronic hypoxic respiratory failure and volume overload pulmonary asked to evaluate on 9/26 for ongoing oxygen requirements History of present illness   This is a 78 year old male patient who was admitted on 9/23 with chief complaint of worsening shortness of breath with exertion, weight gain from 208 to 243 pounds, and worsening lower extremity edema.  He was seen by his primary care provider who noted ambulatory room air saturation of 85 to 86%.  Because of the symptoms he resented to the emergency department for further evaluation.  He was admitted with a working diagnosis of CHF, echocardiogram was ordered and IV diuresis was initiated. Hospital course: 9/24: Requiring increased supplemental oxygen.  Arterial blood gas showing progressive hypercarbia, placed on BiPAP  9/25: Echocardiogram: EF 65 to 70%, right atrial pressure estimated at 5 mmHg, ET chest obtained this was negative for pulmonary emboli.  Had wheezing on exam so placed on steroids and azithromycin for possible COPD exacerbation.  Oxygen levels dropping to 75% on 6 L during CT imaging. 9/26: Feeling better overall less short of breath.  Now 3.5 L negative.  Pulmonary consulted for ongoing oxygen requirements  Past Medical History  CKD,Hypertension, benign prostatic hypertrophy with prior TURP.  Obesity.  Significant Hospital Events     Consults:  Pulmonary consulted 9/26  Procedures:    Significant Diagnostic Tests:  Echocardiogram 9/25 EF 65 to 70% estimated CVP about 6.  No mention of diastolic function.  RA and RV appeared normal.  No estimation on pulmonary artery pressures. CT imaging: 9/25: Negative for PE, minimal basilar atelectasis VQ scan 9/26 low  probability for PE   Micro Data:    Antimicrobials:  Azithromycin 9/25  Interim history/subjective:  He feels much better since hospital admission  Objective   Blood pressure 125/77, pulse 93, temperature 97.9 F (36.6 C), temperature source Oral, resp. rate (Abnormal) 22, weight 101.8 kg, SpO2 95 %.    FiO2 (%):  [5 %-40 %] 5 %   Intake/Output Summary (Last 24 hours) at 09/06/2019 1118 Last data filed at 09/05/2019 1920 Gross per 24 hour  Intake 240 ml  Output 1100 ml  Net -860 ml   Filed Weights   09/05/19 0505 09/06/19 0500  Weight: 101.9 kg 101.8 kg    Examination: General: Slightly obese 78 year old white male sitting up in bed he is able to complete multiple phrase sentences without rest.  Appears comfortable in the upright position on 4 L of oxygen  HENT: Normocephalic atraumatic no jugular venous distention is appreciated mucous membranes are moist Lungs: Scattered rhonchi particularly with cough faint distant expiratory wheeze particularly on the right Cardiovascular: Regular rate and rhythm Abdomen: Soft not tender Extremities: Lower extremity edema is present chronic venous stasis noted.  Clubbing on fingers Neuro: Awake oriented no focal deficits GU: Voids without difficulty  Resolved Hospital Problem list     Assessment & Plan:  Acute on chronic hypoxic respiratory failure in the setting of what most likely decompensated diastolic heart failure with volume overload due to chronic hypoxia COPD (presumptive diagnosis) Diastolic dysfunction Obesity Possible sleep apnea  Discussion This 78 year old male patient who was admitted with several pound weight gain, lower extremity edema and associated shortness of breath all of  which is improved with diuresis.  He continues to exhibit lower extremity edema and oxygen requirement.  Currently 4 L with saturations 88 to 90%.  He has evidence of chronic hypoxia on exam with clubbing noted in his fingers, and he has had  significant exertional dyspnea for the last 4 years which is really limited activity tolerance.  This is all become much progressively worse the week prior to admission, but as mentioned he has improved significantly from a clinical standpoint since admission.  I do suspect there is been an element of poor data recording, as his pulse oximetry waveform is terrible, and he has a hard time keeping his pulse oximeter in place.  He likely does have underlying COPD, I am not convinced he is actually had a typical exacerbation.  Plan/recommendation Continue supplemental oxygen, it is imperative that he has walking oximetry prior to discharge I suspect he will need oxygen at home Going to try nebulized therapy, it seems as though he has had trouble with the timing of MDI administration We will add flutter valve Continue diuresis, I think he still has more volume to give Complete 5 days of azithromycin Complete 5 days of prednisone He will need follow-up in our clinic at time of discharge we can help arrange that We will see again on Monday  Labs   CBC: Recent Labs  Lab 09/03/19 1705 09/03/19 1833 09/05/19 0333 09/06/19 0255  WBC 5.4  --  6.7 9.0  NEUTROABS 4.1  --   --   --   HGB 12.5* 12.6* 12.3* 12.3*  HCT 40.3 37.0* 39.7 40.4  MCV 95.3  --  96.1 94.0  PLT 184  --  191 236    Basic Metabolic Panel: Recent Labs  Lab 09/03/19 1705 09/03/19 1833 09/04/19 0458 09/05/19 0333 09/06/19 0255  NA 137 136 140 142 141  K 4.3 4.3 4.4 3.7 4.0  CL 96*  --  96* 94* 92*  CO2 31  --  34* 39* 39*  GLUCOSE 125*  --  191* 112* 128*  BUN 14  --  15 22 32*  CREATININE 1.02  --  1.06 1.11 1.24  CALCIUM 8.5*  --  8.6* 8.4* 8.7*  MG  --   --  2.0 2.2  --    GFR: Estimated Creatinine Clearance: 57.7 mL/min (by C-G formula based on SCr of 1.24 mg/dL). Recent Labs  Lab 09/03/19 1705 09/05/19 0333 09/06/19 0255  WBC 5.4 6.7 9.0    Liver Function Tests: Recent Labs  Lab 09/03/19 1705 09/05/19  0333 09/06/19 0255  AST 20 17 18   ALT 24 21 22   ALKPHOS 60 55 56  BILITOT 0.4 0.7 0.6  PROT 5.9* 5.4* 5.7*  ALBUMIN 3.1* 2.9* 2.9*   No results for input(s): LIPASE, AMYLASE in the last 168 hours. No results for input(s): AMMONIA in the last 168 hours.  ABG    Component Value Date/Time   PHART 7.416 09/06/2019 0855   PCO2ART 62.3 (H) 09/06/2019 0855   PO2ART 80.2 (L) 09/06/2019 0855   HCO3 39.3 (H) 09/06/2019 0855   TCO2 37 (H) 09/03/2019 1833   O2SAT 95.9 09/06/2019 0855     Coagulation Profile: Recent Labs  Lab 09/04/19 0844  INR 1.1    Cardiac Enzymes: No results for input(s): CKTOTAL, CKMB, CKMBINDEX, TROPONINI in the last 168 hours.  HbA1C: Hgb A1c MFr Bld  Date/Time Value Ref Range Status  09/04/2019 06:12 PM 6.5 (H) 4.8 - 5.6 % Final  Comment:    (NOTE) Pre diabetes:          5.7%-6.4% Diabetes:              >6.4% Glycemic control for   <7.0% adults with diabetes     CBG: Recent Labs  Lab 09/04/19 2007 09/05/19 0844 09/05/19 1106 09/05/19 1641 09/06/19 0857  GLUCAP 114* 131* 192* 153* 152*    Review of Systems:   Review of Systems  Constitutional: Positive for malaise/fatigue.  HENT: Negative for congestion and sinus pain.   Eyes: Negative.   Respiratory: Positive for cough, sputum production, shortness of breath and wheezing. Negative for stridor.   Cardiovascular: Positive for chest pain, leg swelling and PND.  Gastrointestinal: Positive for heartburn.  Skin: Positive for rash.     Past Medical History  He,  has a past medical history of COPD (chronic obstructive pulmonary disease) (HCC) and Hypertension.   Surgical History   History reviewed. No pertinent surgical history.   Social History   reports that he quit smoking about 7 years ago. He has never used smokeless tobacco. He reports that he does not drink alcohol or use drugs.   Family History   His family history is not on file.   Allergies Allergies  Allergen  Reactions  . Codeine   . Penicillins   . Sulfa Antibiotics      Home Medications  Prior to Admission medications   Medication Sig Start Date End Date Taking? Authorizing Provider  acetaminophen (TYLENOL) 325 MG tablet Take 650 mg by mouth every 6 (six) hours as needed for mild pain.   Yes [provider]  albuterol (VENTOLIN HFA) 108 (90 Base) MCG/ACT inhaler Inhale 3 puffs into the lungs every 6 (six) hours as needed for wheezing or shortness of breath.   Yes [provider]  furosemide (LASIX) 40 MG tablet Take 1 tablet (40 mg total) by mouth daily. 07/14/19  Yes Dione Booze, MD  lisinopril (ZESTRIL) 10 MG tablet Take 10 mg by mouth daily. 05/01/19  Yes [provider]  predniSONE (DELTASONE) 20 MG tablet Take 60 mg daily x 2 days then 40 mg daily x 2 days then 20 mg daily x 2 days 08/14/19  Yes Charlynne Pander, MD     Simonne Martinet ACNP-BC [facility] Pulmonary/Critical Care Pager # [phone] OR # [phone] if no answer

## 2019-09-06 NOTE — Progress Notes (Addendum)
Subjective: Patient stated that he is feeling better overall and experiencing less shortness of breath overnight.  He endorses use of biomass feels in his home for eating purposes in addition to his excessive smoking history.  Patient states that for the past 5 years he has had progressive shortness of breath when he has required more frequent breaks while working outdoors.  He also endorses increasing cough with sputum production.  Objective:  Vital signs in last 24 hours: Vitals:   09/05/19 1712 09/05/19 2012 09/06/19 0500 09/06/19 0510  BP:  106/72  125/77  Pulse:    93  Resp: 17 19  (!) 22  Temp:  98.2 F (36.8 C)  97.9 F (36.6 C)  TempSrc:  Oral  Oral  SpO2: (!) 86% 90%  95%  Weight:   101.8 kg    General: A/O x4, in no acute distress, afebrile, nondiaphoretic HEENT: PEERL, EMO intact Cardio: RRR, no mrg's  Pulmonary:  Faint wheezing bilaterally with decreased breath sounds Abdomen: Bowel sounds normal, soft, nontender  MSK: BLE nontender, nonedematous Psych: Appropriate affect, not depressed in appearance, engages well  CMP Latest Ref Rng & Units 09/06/2019 09/05/2019 09/04/2019  Glucose 70 - 99 mg/dL 128(H) 112(H) 191(H)  BUN 8 - 23 mg/dL 32(H) 22 15  Creatinine 0.61 - 1.24 mg/dL 1.24 1.11 1.06  Sodium 135 - 145 mmol/L 141 142 140  Potassium 3.5 - 5.1 mmol/L 4.0 3.7 4.4  Chloride 98 - 111 mmol/L 92(L) 94(L) 96(L)  CO2 22 - 32 mmol/L 39(H) 39(H) 34(H)  Calcium 8.9 - 10.3 mg/dL 8.7(L) 8.4(L) 8.6(L)  Total Protein 6.5 - 8.1 g/dL 5.7(L) 5.4(L) -  Total Bilirubin 0.3 - 1.2 mg/dL 0.6 0.7 -  Alkaline Phos 38 - 126 U/L 56 55 -  AST 15 - 41 U/L 18 17 -  ALT 0 - 44 U/L 22 21 -   CBC Latest Ref Rng & Units 09/06/2019 09/05/2019 09/03/2019  WBC 4.0 - 10.5 K/uL 9.0 6.7 -  Hemoglobin 13.0 - 17.0 g/dL 12.3(L) 12.3(L) 12.6(L)  Hematocrit 39.0 - 52.0 % 40.4 39.7 37.0(L)  Platelets 150 - 400 K/uL 236 191 -    Patient Encounter Summary: Aaron Daniels is a 78 year old male with a PMHx  notable for HTN, BPH s/p TURP, and significant tobacco use disorder history who presented to the ED with progressive bilateral lower extremity edema and shortness of breath of 2 months duration.  Initial presentation was concerning for heart failure exacerbation although his BNP was normal given the interstitial edema on chest x-ray.  Subsequent echocardiogram revealed a normal ejection fraction of the left ventricle and otherwise unremarkable cardiac features.  As such, concern for atypical pneumonia versus COPD exacerbation was noted.  Assessment/Plan:  Active Problems:   Acute respiratory failure with hypoxia (HCC)   Dyspnea  Dyspnea: Hypoxia: Hypercapnia: Most likely COPD exacerbation given increased sputum production, cough, dyspnea in the setting of his tobacco use history as well as his in home biomass fuel use history. Echo LVEF of 65-70% absent other diagnostic findings. CT with PE protocol was completed and was negative but noted as a poor study due to inappropriate timing of the contrast load.  Patient has intermittently required high flow supplemental oxygen via nasal cannula and or BiPAP. Off of Bipap today and feeling better overall.  Patient continues to desaturate to less than 80% without supplemental oxygen. PCO2 improved now off of BiPAP. Given his increase in bicarb on his BMP with the mild alkalosis I suspect  a mild metabolic alkalosis is present likely 2/2 to contraction alkalosis from loop diuretic use.  - VQ scan to evaluate for shunting - We are consulting pulmonology today - Continue Azithromycin 500mg  to complete a five day course - Continue DuoNebs every 6 hours PRN - Start Anora Ellipta to continue as an outpatient - Ordered flutter valve and incentive spirometry - Continue prednisone 40mg   - Strict I/O's and daily weights - Continue BiPAP in our system on oxygen PRN to maintain O2 sats between 88-92% - Patient will need PFTs once stabilized as an outpatient   Diabetes mellitus type 2: Stable today.  Levels within desired range.  Continue SSI  Iron deficiency anemia: Patient Hgb stable 12.6 -> 12.3. Stable.  -Will need outpatient colonoscopy.   Diet: Heart healthy DVT prophylaxis: Lovenox Dispo: Anticipated discharge in approximately 2-3 day(s).   , MD 09/06/2019, 8:03 AM Pager: # [phone]

## 2019-09-06 NOTE — Progress Notes (Signed)
  Echocardiogram 2D Echocardiogram saline bubble study has been performed.  Bobbye Charleston 09/06/2019, 3:58 PM

## 2019-09-06 NOTE — Progress Notes (Signed)
Pt 02 sats low 80s, placed back on high flow at 6 liters, Sats at 94% at present, will cont to monitor.

## 2019-09-06 NOTE — Progress Notes (Signed)
OT Cancellation Note  Patient Details Name: Aaron Daniels MRN: [id number] DOB: Nov 15, 1941   Cancelled Treatment:    Reason Eval/Treat Not Completed: Other (comment)(Pt at procedure and then with MD for pulmonary status.)   OTR left energy conservation technique papers in room. OT attempting pt several times today and unable to see him. OT to continue to follow.  Ebony Hail Harold Hedge) Marsa Aris OTR/L Acute Rehabilitation Services Pager: [phone] Office: [place] 09/06/2019, 3:56 PM

## 2019-09-07 DIAGNOSIS — J441 Chronic obstructive pulmonary disease with (acute) exacerbation: Secondary | ICD-10-CM | POA: Diagnosis present

## 2019-09-07 DIAGNOSIS — I5030 Unspecified diastolic (congestive) heart failure: Secondary | ICD-10-CM | POA: Diagnosis present

## 2019-09-07 LAB — BASIC METABOLIC PANEL
Anion gap: 9 (ref 5–15)
BUN: 26 mg/dL — ABNORMAL HIGH (ref 8–23)
CO2: 36 mmol/L — ABNORMAL HIGH (ref 22–32)
Calcium: 8.8 mg/dL — ABNORMAL LOW (ref 8.9–10.3)
Chloride: 94 mmol/L — ABNORMAL LOW (ref 98–111)
Creatinine, Ser: 0.93 mg/dL (ref 0.61–1.24)
GFR calc Af Amer: >60 mL/min (ref >60)
GFR calc non Af Amer: >60 mL/min (ref >60)
Glucose, Bld: 101 mg/dL — ABNORMAL HIGH (ref 70–99)
Potassium: 4.3 mmol/L (ref 3.5–5.1)
Sodium: 139 mmol/L (ref 135–145)

## 2019-09-07 LAB — GLUCOSE, CAPILLARY
Glucose-Capillary: 89 mg/dL (ref 70–99)
Glucose-Capillary: 154 mg/dL — ABNORMAL HIGH (ref 70–99)
Glucose-Capillary: 135 mg/dL — ABNORMAL HIGH (ref 70–99)
Glucose-Capillary: 172 mg/dL — ABNORMAL HIGH (ref 70–99)

## 2019-09-07 MED ORDER — FUROSEMIDE 10 MG/ML IJ SOLN
20.0000 mg | Freq: Every day | INTRAMUSCULAR | Status: DC
  Administered 2019-09-07 – 2019-09-08 (×2): 20 mg via INTRAVENOUS
  Filled 2019-09-07 (×2): qty 2

## 2019-09-07 NOTE — Evaluation (Signed)
Occupational Therapy Evaluation Patient Details Name: Aaron Daniels MRN: [id number] DOB: 10-15-1941 Today's Date: 09/07/2019    History of Present Illness Aaron Daniels is a 78 y.o male with HTN, BPH s/p TURP, and a significant tobacco use history who presented to the ED with progressive bilateral LE edema and SHOB of 2 months duration. History was obtained via the patient, his wife, his son, and through chart review.    Clinical Impression   Pt progressing to EOB. Pt performed grooming in supine as pt's HR increased to 204 BPM, very tachy at EOB. Pt immediately returning to supine and HR adjusted <150 BPM. Pt stating "I don't feel any different." Education for energy conservation techniques with handouts provided. Pt agreeable to read in detail. O2 >90% at EOB on 6L [place]; when returned to supine, pt on 4L O2 >90%. Pt would benefit from continued OT skilled services for OOB ADL and stating conservation strategies. OT following acutely.    Follow Up Recommendations  Home health OT;Supervision - Intermittent    Equipment Recommendations  None recommended by OT    Recommendations for Other Services       Precautions / Restrictions Precautions Precautions: Fall Restrictions Weight Bearing Restrictions: No      Mobility Bed Mobility Overal bed mobility: Independent             General bed mobility comments: Pt remained in bed due to O2 saturations while on 6LO2 via [place] >90%  Transfers                 General transfer comment: deferred. HR to 205 BPM at EOB.    Balance Overall balance assessment: Needs assistance Sitting-balance support: No upper extremity supported;Feet supported Sitting balance-Leahy Scale: Good                                     ADL either performed or assessed with clinical judgement   ADL Overall ADL's : Needs assistance/impaired                                     Functional mobility during ADLs:  (deferred) General ADL Comments: Pt performed grooming in supine as pt's HR increased to 204 BPM, very tachy at EOB. Pt immediately returning to supine and HR adjusted <150 BPM     Vision   Vision Assessment?: No apparent visual deficits     Perception     Praxis      Pertinent Vitals/Pain Pain Assessment: No/denies pain     Hand Dominance     Extremity/Trunk Assessment Upper Extremity Assessment Upper Extremity Assessment: Generalized weakness   Lower Extremity Assessment Lower Extremity Assessment: Generalized weakness       Communication     Cognition Arousal/Alertness: Awake/alert Behavior During Therapy: WFL for tasks assessed/performed Overall Cognitive Status: Within Functional Limits for tasks assessed                                 General Comments: very talkative   General Comments  Education for energy conservation techniques with handouts provided. Pt tolerating session poor as HR increased too high after sitting EOB x3 mins    Exercises     Shoulder Instructions      Home Living  Prior Functioning/Environment                   OT Problem List:        OT Treatment/Interventions:      OT Goals(Current goals can be found in the care plan section) Acute Rehab OT Goals Patient Stated Goal: to go home and be with wife and dog OT Goal Formulation: With patient Time For Goal Achievement: 09/19/19 Potential to Achieve Goals: Good ADL Goals Pt Will Perform Grooming: with modified independence;standing Pt Will Perform Upper Body Dressing: with modified independence;sitting Pt Will Perform Lower Body Dressing: with modified independence;sit to/from stand Pt Will Transfer to Toilet: with modified independence;ambulating Pt Will Perform Toileting - Clothing Manipulation and hygiene: with modified independence;sit to/from stand Additional ADL Goal #1: Pt will verbalize 3 ways  of conserving energy during ADL at independent level  OT Frequency: Min 2X/week   Barriers to D/C:            Co-evaluation              AM-PAC OT "6 Clicks" Daily Activity     Outcome Measure Help from another person eating meals?: None Help from another person taking care of personal grooming?: A Little Help from another person toileting, which includes using toliet, bedpan, or urinal?: A Little Help from another person bathing (including washing, rinsing, drying)?: A Little Help from another person to put on and taking off regular upper body clothing?: A Little Help from another person to put on and taking off regular lower body clothing?: A Little 6 Click Score: 19   End of Session Equipment Utilized During Treatment: Oxygen Nurse Communication: Mobility status;Other (comment)(HR)  Activity Tolerance: Treatment limited secondary to medical complications (Comment) Patient left: in bed;with call bell/phone within reach;with bed alarm set  OT Visit Diagnosis: Other abnormalities of gait and mobility (R26.89)                Time: 7741-2878 OT Time Calculation (min): 25 min Charges:  OT General Charges $OT Visit: 1 Visit OT Treatments $Self Care/Home Management : 8-22 mins $Therapeutic Activity: 8-22 mins  Aaron Daniels) Marsa Aris OTR/L Acute Rehabilitation Services Pager: [phone] Office: [place] 09/07/2019, 3:42 PM

## 2019-09-07 NOTE — Progress Notes (Addendum)
Subjective: Patient states that he feels better than yesterday. No dyspnea reported o/n. Patient inquires about need for fluid pill as his pants size has increased recently. Objective:  Vital signs in last 24 hours: Vitals:   09/07/19 0007 09/07/19 0439 09/07/19 0739 09/07/19 0746  BP:  122/85    Pulse: 89 81    Resp: 20 20    Temp:  (!) 97.4 F (36.3 C)    TempSrc:  Oral    SpO2: 93% 99% 99% 99%  Weight:  98.8 kg     General: NAD, alert and oriented x4, afebrile Cardio: RRR, no m/r/g, JVD present  Pulmonary: CTAB, no r/r/w MSK: BLE nontender, 1+ pitting edema bilaterally Psych: Affect appropriate, engages well  CMP Latest Ref Rng & Units 09/07/2019 09/06/2019 09/05/2019  Glucose 70 - 99 mg/dL 101(H) 128(H) 112(H)  BUN 8 - 23 mg/dL 26(H) 32(H) 22  Creatinine 0.61 - 1.24 mg/dL 0.93 1.24 1.11  Sodium 135 - 145 mmol/L 139 141 142  Potassium 3.5 - 5.1 mmol/L 4.3 4.0 3.7  Chloride 98 - 111 mmol/L 94(L) 92(L) 94(L)  CO2 22 - 32 mmol/L 36(H) 39(H) 39(H)  Calcium 8.9 - 10.3 mg/dL 8.8(L) 8.7(L) 8.4(L)  Total Protein 6.5 - 8.1 g/dL - 5.7(L) 5.4(L)  Total Bilirubin 0.3 - 1.2 mg/dL - 0.6 0.7  Alkaline Phos 38 - 126 U/L - 56 55  AST 15 - 41 U/L - 18 17  ALT 0 - 44 U/L - 22 21   CBC Latest Ref Rng & Units 09/06/2019 09/05/2019 09/03/2019  WBC 4.0 - 10.5 K/uL 9.0 6.7 -  Hemoglobin 13.0 - 17.0 g/dL 12.3(L) 12.3(L) 12.6(L)  Hematocrit 39.0 - 52.0 % 40.4 39.7 37.0(L)  Platelets 150 - 400 K/uL 236 191 -    Patient Encounter Summary: Aaron Daniels is a 78 year old male with a PMHx notable for HTN, BPH s/p TURP, and significant tobacco use disorder history who presented to the ED with progressive bilateral lower extremity edema and shortness of breath of 2 months duration.  Initial presentation was concerning for heart failure exacerbation although his BNP was normal given the interstitial edema on chest x-ray.  Subsequent echocardiogram revealed a normal ejection fraction of the left ventricle and  otherwise unremarkable cardiac features.  Concern for COPD exacerbation and HFpEF given improvement with steroids and volume overload.  Assessment/Plan:  Active Problems:   Acute respiratory failure with hypoxia (HCC)   Dyspnea   Heart failure with preserved ejection fraction (HCC)   COPD exacerbation (HCC)  ARF w/hypoxia  COPD exacerbation  HFpEF Given symptom improvement after starting steroids, azithromycin and inhaled COPD therapy in addition to increased sputum production, cough, and dyspnea on presentation, COPD exacerbation is likely. Patient will likely require supplemental O2 at time of discharge. Notably, in the last month, patient reports increase in pants size and lower extremity swelling. These findings are consistent with HFpEF. Echo LVEF of 65-70%. Patient states he was breathing better at home while on furosemide. VQ scan is inconsistent with PE and otherwise does not explain ARF. Echocardiogram with bubble study demonstrated no shunting. Will start oral diuretic therapy for symptomatic HFpEF. - Pulmonology following, appreciate recs - Continue Azithromycin 500mg  to complete a five day course (ends 09/30) - Continue DuoNebs every 6 hours PRN for SOB - Start Pulmicort, Brovana, Atrovent (nebulizer) to continue as an outpatient - Continue flutter valve and incentive spirometry - Continue prednisone 40mg  to complete a five day course (ends 09/30) - Start furosemide 20mg  IV  today-> furosemide 40mg  PO daily 09/28 AM - Strict I/O's and daily weights - Supplemental O2, target O2 sats between 88-92% - Patient will need PFTs once stabilized as an outpatient  Obstructive Sleep Apnea: Concern for OSA given history of snoring and chronic fatigue despite adequate sleep.  Diabetes mellitus type 2: Stable today. Levels within desired range. Continue SSI  Iron deficiency anemia: Patient Hgb stable 12.6 -> 12.3. Stable.  -Will need outpatient colonoscopy.   Diet: Heart healthy DVT  prophylaxis: Lovenox Dispo: Anticipated discharge in approximately 2-3 day(s).   10/28, Medical Student 09/07/2019, 10:06 AM Pager: # [phone]

## 2019-09-08 DIAGNOSIS — Z885 Allergy status to narcotic agent status: Secondary | ICD-10-CM

## 2019-09-08 DIAGNOSIS — I5030 Unspecified diastolic (congestive) heart failure: Secondary | ICD-10-CM

## 2019-09-08 DIAGNOSIS — Z881 Allergy status to other antibiotic agents status: Secondary | ICD-10-CM

## 2019-09-08 DIAGNOSIS — E119 Type 2 diabetes mellitus without complications: Secondary | ICD-10-CM

## 2019-09-08 DIAGNOSIS — I110 Hypertensive heart disease with heart failure: Secondary | ICD-10-CM

## 2019-09-08 DIAGNOSIS — J441 Chronic obstructive pulmonary disease with (acute) exacerbation: Secondary | ICD-10-CM

## 2019-09-08 DIAGNOSIS — Z880 Allergy status to penicillin: Secondary | ICD-10-CM

## 2019-09-08 DIAGNOSIS — G4733 Obstructive sleep apnea (adult) (pediatric): Secondary | ICD-10-CM

## 2019-09-08 LAB — BASIC METABOLIC PANEL
Anion gap: 12 (ref 5–15)
BUN: 26 mg/dL — ABNORMAL HIGH (ref 8–23)
CO2: 34 mmol/L — ABNORMAL HIGH (ref 22–32)
Calcium: 8.8 mg/dL — ABNORMAL LOW (ref 8.9–10.3)
Chloride: 91 mmol/L — ABNORMAL LOW (ref 98–111)
Creatinine, Ser: 1.15 mg/dL (ref 0.61–1.24)
GFR calc Af Amer: >60 mL/min (ref >60)
GFR calc non Af Amer: 60 mL/min — ABNORMAL LOW (ref >60)
Glucose, Bld: 240 mg/dL — ABNORMAL HIGH (ref 70–99)
Potassium: 3.6 mmol/L (ref 3.5–5.1)
Sodium: 137 mmol/L (ref 135–145)

## 2019-09-08 LAB — GLUCOSE, CAPILLARY
Glucose-Capillary: 101 mg/dL — ABNORMAL HIGH (ref 70–99)
Glucose-Capillary: 176 mg/dL — ABNORMAL HIGH (ref 70–99)
Glucose-Capillary: 212 mg/dL — ABNORMAL HIGH (ref 70–99)

## 2019-09-08 LAB — CBC
HCT: 42.1 % (ref 39.0–52.0)
Hemoglobin: 13.1 g/dL (ref 13.0–17.0)
MCH: 29.1 pg (ref 26.0–34.0)
MCHC: 31.1 g/dL (ref 30.0–36.0)
MCV: 93.6 fL (ref 80.0–100.0)
Platelets: 211 10*3/uL (ref 150–400)
RBC: 4.5 MIL/uL (ref 4.22–5.81)
RDW: 14.6 % (ref 11.5–15.5)
WBC: 6.2 10*3/uL (ref 4.0–10.5)
nRBC: 0 % (ref 0.0–0.2)

## 2019-09-08 MED ORDER — FUROSEMIDE 20 MG PO TABS: 20.0000 mg | ORAL_TABLET | Freq: Every day | ORAL | 0 refills | Status: DC

## 2019-09-08 MED ORDER — PREDNISONE 20 MG PO TABS: 40.0000 mg | ORAL_TABLET | Freq: Every day | ORAL | 0 refills | Status: DC

## 2019-09-08 MED ORDER — UMECLIDINIUM-VILANTEROL 62.5-25 MCG/INH IN AEPB: 1.0000 | INHALATION_SPRAY | Freq: Every day | RESPIRATORY_TRACT | 2 refills | Status: DC

## 2019-09-08 MED ORDER — IPRATROPIUM BROMIDE 0.02 % IN SOLN
0.5000 mg | Freq: Two times a day (BID) | RESPIRATORY_TRACT | Status: DC
  Administered 2019-09-08: 0.5 mg via RESPIRATORY_TRACT
  Filled 2019-09-08: qty 2.5

## 2019-09-08 MED ORDER — AZITHROMYCIN 250 MG PO TABS: 250.0000 mg | ORAL_TABLET | Freq: Every day | ORAL | 0 refills | Status: DC

## 2019-09-08 MED ORDER — FUROSEMIDE 40 MG PO TABS: 40.0000 mg | ORAL_TABLET | Freq: Every day | ORAL | 2 refills | Status: DC

## 2019-09-08 MED ORDER — UMECLIDINIUM-VILANTEROL 62.5-25 MCG/INH IN AEPB
1.0000 | INHALATION_SPRAY | Freq: Every day | RESPIRATORY_TRACT | Status: DC
  Administered 2019-09-08: 1 via RESPIRATORY_TRACT
  Filled 2019-09-08: qty 14

## 2019-09-08 NOTE — Care Management Important Message (Signed)
Important Message  Patient Details  Name: Aaron Daniels MRN: [id number] Date of Birth: 07/20/1941   Medicare Important Message Given:  Yes     Orbie Pyo 09/08/2019, 4:06 PM

## 2019-09-08 NOTE — Progress Notes (Signed)
SATURATION QUALIFICATIONS: (This note is used to comply with regulatory documentation for home oxygen)  Patient Saturations on Room Air at Rest = 87%  Patient Saturations on Room Air while Ambulating = N/A  Patient Saturations on 3 Liters of oxygen while at rest = 90%  Please briefly explain why patient needs home oxygen:

## 2019-09-08 NOTE — Discharge Summary (Addendum)
Name: Aaron Daniels MRN: [id number] DOB: 1941/02/21 78 y.o. PCP: Kyla BalzarineMorehart, Jodi, PA-C  Date of Admission: 09/03/2019  4:29 PM Date of Discharge: 09/08/2019 Attending Physician: Dr. Heide SparkNarendra  Discharge Diagnosis: 1. COPD Exacerbation 2. HFpEF 3. Iron Deficiency Anemia 4. Diabetes Mellitus, type II   Discharge Medications: Allergies as of 09/08/2019      Reactions   Codeine    Penicillins    Sulfa Antibiotics       Medication List    TAKE these medications   acetaminophen 325 MG tablet Commonly known as: TYLENOL Take 650 mg by mouth every 6 (six) hours as needed for mild pain.   albuterol 108 (90 Base) MCG/ACT inhaler Commonly known as: VENTOLIN HFA Inhale 3 puffs into the lungs every 6 (six) hours as needed for wheezing or shortness of breath.   azithromycin 250 MG tablet Commonly known as: ZITHROMAX Take 1 tablet (250 mg total) by mouth daily.   furosemide 20 MG tablet Commonly known as: LASIX Take 1 tablet (20 mg total) by mouth daily. What changed:   medication strength  how much to take   lisinopril 10 MG tablet Commonly known as: ZESTRIL Take 10 mg by mouth daily.   predniSONE 20 MG tablet Commonly known as: DELTASONE Take 2 tablets (40 mg total) by mouth daily with breakfast. Until you complete the course What changed:   how much to take  how to take this  when to take this  additional instructions   umeclidinium-vilanterol 62.5-25 MCG/INH Aepb Commonly known as: ANORO ELLIPTA Inhale 1 puff into the lungs daily.       Disposition and follow-up:   Aaron Daniels [facility] in Stable condition.  At the hospital follow up visit please address:  1. HFpEF: Evaluate volume status-titrate diuretic therapy as appropriate. COPD: Monitor oxygen saturation and titrate home O2 as appropriate. Set up outpatient PFTs when clinically improved. Evaluate and treat type II diabetes mellitus. Consider outpatient  colonoscopy for new iron deficiency anemia.  Screen for obstructive sleep apnea-pulmonology recommended sleep study.  2.  Labs / imaging needed at time of follow-up: CBC, CMP  Follow-up Appointments: Follow-up Information    Glenford BayleyWalsh, Elizabeth W, NP. Go on 09/15/2019.   Specialty: Pulmonary Disease Why: At 3 PM.  Please call our office if you need to reschedule your appointment.  Contact information: [address] [place] [address] [phone]        Inc[facility] Follow up.   Why: oxygen- to be delivered to room prior to transition home.  Contact information: [address] [place] [address] [phone]        [facility], [facility] Follow up.   Specialty: Home Health Services Why: Registered Nurse, Physical Therapy, Occupational Therapy- office to call you with visit times.  Contact information: [address] [place] [address] [phone]           Hospital Course: Summary: Aaron Daniels is a 78 year old male with a PMHx notable for HTN, BPH s/p TURP, and significant tobacco use disorder history who presented to the ED with progressive bilateral lower extremity edema and shortness of breath of 2 months duration. Patient found to have COPD exacerbation and HFpEF given improvement with steroids and volume overload responsive to diuretics.  ARF w/hypoxia and hypercapnia  COPD exacerbation   HFpEF Pt presented with two weeks of progressive shortness of breath and lower extremity edema. He has had  lower extremity edema and increased dyspnea for the last three years. Presentation concerning for CHF exacerbation and COPD exacerbation. BNP 85.2 on admission. EF 65-70% on echocardiogram. Liver/renal function do not support liver/renal failure sources of volume overload. Bubble study negative reflecting no cardiovascular shunting. CT angiogram of the chest negative for PE. VQ scan negative for PE. Pt treated with diuretics for HFpEF due to  LE edema and JVD; pt reported improvement in dyspnea after diuresis with IV furosemide before initiation of COPD exacerbation treatment due to increased wheezing. Pt treated with prednisone, azithromycin, supplemental oxygen, LABA, LAMA and ICS for COPD exacerbation. Pt discharged on furosemide, prednisone, azithromycin, supplemental oxygen and LABA/LAMA (Anoro). It is unclear how many COPD exacerbations Aaron Daniels has had in the past year. He will need outpatient pulmonary function testing to formalize Gold score; he appears to have Gold D COPD. ICS can be considered after outpatient PFTs.   Iron deficiency anemia: Patient Hgb 12.3 on presentation->13.1 at discharge. Will need outpatient colonoscopy.   Diabetes mellitus type 2: Pt placed on sliding scale insulin during hospitalization due to elevated blood glucose. He required 0-2 units of aspart after each POC glucose due to blood glucose levels.   Obstructive Sleep Apnea: Concern for OSA given history of snoring and chronic fatigue despite adequate sleep.  Discharge Vitals:   BP 129/87 (BP Location: Left Arm)    Pulse (!) 101    Temp 98 F (36.7 C) (Oral)    Resp 18    Ht 5\' 11"  (1.803 m)    Wt 221 lb (100.2 kg)    SpO2 91%    BMI 30.82 kg/m   Pertinent Labs, Studies, and Procedures:  CMP Latest Ref Rng & Units 09/08/2019 09/07/2019 09/06/2019  Glucose 70 - 99 mg/dL 240(H) 101(H) 128(H)  BUN 8 - 23 mg/dL 26(H) 26(H) 32(H)  Creatinine 0.61 - 1.24 mg/dL 1.15 0.93 1.24  Sodium 135 - 145 mmol/L 137 139 141  Potassium 3.5 - 5.1 mmol/L 3.6 4.3 4.0  Chloride 98 - 111 mmol/L 91(L) 94(L) 92(L)  CO2 22 - 32 mmol/L 34(H) 36(H) 39(H)  Calcium 8.9 - 10.3 mg/dL 8.8(L) 8.8(L) 8.7(L)  Total Protein 6.5 - 8.1 g/dL - - 5.7(L)  Total Bilirubin 0.3 - 1.2 mg/dL - - 0.6  Alkaline Phos 38 - 126 U/L - - 56  AST 15 - 41 U/L - - 18  ALT 0 - 44 U/L - - 22   Lab Results  Component Value Date   WBC 6.2 09/08/2019   HGB 13.1 09/08/2019   HCT 42.1 09/08/2019    MCV 93.6 09/08/2019   PLT 211 09/08/2019   BNP (last 3 results) Recent Labs    07/14/19 2034 08/13/19 2143 09/03/19 1726  BNP 32.5 32.1 85.2   Discharge Instructions: Discharge Instructions    (HEART FAILURE PATIENTS) Call MD:  Anytime you have any of the following symptoms: 1) 3 pound weight gain in 24 hours or 5 pounds in 1 week 2) shortness of breath, with or without a dry hacking cough 3) swelling in the hands, feet or stomach 4) if you have to sleep on extra pillows at night in order to breathe.   Complete by: As directed    Call MD for:  difficulty breathing, headache or visual disturbances   Complete by: As directed    Diet - low sodium heart healthy   Complete by: As directed    Discharge instructions   Complete by: As directed  It was a pleasure to take care of you during your hospitalization!   You were admitted due to the increased shortness of breath and the leg swelling you have had for the past couple weeks. It was determined that your shortness of breath was likely due to a COPD exacerbation which is a worsening of your chronic COPD. COPD is a progressive lung disease from smoking. The increased swelling in your legs is thought to be due to an issue with the left side of your heart not pumping blood effectively. This may also be contributing to your shortness of breath. We treated your heart failure with diuretics (fluid pills). You will need to monitor your weight at home. If you gain 3 pounds in one day or more than 5 pounds in a week, please contact your primary doctor. You may be retaining more fluid. Your COPD exacerbation was treated with antibiotics, steroids, oxygen and inhalers. Your prednisone (steroid) and azithromycin (antibiotic) finish 09/30. You will remain on oxygen. Your lungs are not able to obtain enough oxygen from air alone. Your primary doctor and the home health nurse will help adjust your oxygen levels. The inhaler you are starting will help control  your shortness of breath long term. If you develop increasing shortness or breath, fever or chest pain, please return to your closest emergency department.    You will have a follow up appointment with a primary care provider on the ground floor of [facility] at 2:45 pm on 10/01.  Please begin taking the new inhaler medication every day. You will not really notice the benefit in your symptoms from this for a while but this medication has been proven to help people with COPD in the long term. Please do not miss a dose. Please continue to take the lasix medication as well as the albuterol inhaler as needed. If your symptoms worsen please let us know by calling [phone] and ask to speak with Dr. Crista Elliot. Please continue to watch your weight daily as well.   Increase activity slowly   Complete by: As directed     Sonia Side, MS4  Signed: Lanelle Bal, MD 09/09/2019, 6:15 PM    Attestation for Student Documentation:  I personally was present and performed or re-performed the history, physical exam and medical decision-making activities of this service and have verified that the service and findings are accurately documented in the students note.  Lanelle Bal, MD 09/09/2019, 6:17 PM

## 2019-09-08 NOTE — Discharge Instructions (Signed)
Shortness of Breath, Adult Shortness of breath means you have trouble breathing. Shortness of breath could be a sign of a medical problem. Follow these instructions at home:   Watch for any changes in your symptoms.  Do not use any products that contain nicotine or tobacco, such as cigarettes, e-cigarettes, and chewing tobacco.  Do not smoke. Smoking can cause shortness of breath. If you need help to quit smoking, ask your doctor.  Avoid things that can make it harder to breathe, such as: ? Mold. ? Dust. ? Air pollution. ? Chemical smells. ? Things that can cause allergy symptoms (allergens), if you have allergies.  Keep your living space clean. Use products that help remove mold and dust.  Rest as needed. Slowly return to your normal activities.  Take over-the-counter and prescription medicines only as told by your doctor. This includes oxygen therapy and inhaled medicines.  Keep all follow-up visits as told by your doctor. This is important. Contact a doctor if:  Your condition does not get better as soon as expected.  You have a hard time doing your normal activities, even after you rest.  You have new symptoms. Get help right away if:  Your shortness of breath gets worse.  You have trouble breathing when you are resting.  You feel light-headed or you pass out (faint).  You have a cough that is not helped by medicines.  You cough up blood.  You have pain with breathing.  You have pain in your chest, arms, shoulders, or belly (abdomen).  You have a fever.  You cannot walk up stairs.  You cannot exercise the way you normally do. These symptoms may represent a serious problem that is an emergency. Do not wait to see if the symptoms will go away. Get medical help right away. Call your local emergency services (911 in the [place] Do not drive yourself to the hospital. Summary  Shortness of breath is when you have trouble breathing enough air. It can be a sign of a  medical problem.  Avoid things that make it hard for you to breathe, such as smoking, pollution, mold, and dust.  Watch for any changes in your symptoms. Contact your doctor if you do not get better or you get worse. This information is not intended to replace advice given to you by your health care provider. Make sure you discuss any questions you have with your health care provider. Document Released: 05/15/2008 Document Revised: 04/29/2018 Document Reviewed: 04/29/2018 Elsevier Patient Education  2020 Elsevier Inc.  

## 2019-09-08 NOTE — TOC Initial Note (Signed)
Transition of Care [facility]) - Initial/Assessment Note    Patient Details  Name: Aaron Daniels MRN: [id number] Date of Birth: Mar 25, 1941  Transition of [facility] CM/SW Contact:    Bethena Roys, RN Phone Number: 09/08/2019, 11:34 AM  Clinical Narrative: Pt presented for Acute respiratory failure- PTA from home with wife with dementia. Wife has Hospice services coming out. Plan for home with 02- pt is agreeable and wants oxygen via [facility]. Referral sent to [facility] and DME 02 to be delivered to room prior to transition home. Patient is also agreeable to [place] via [place]. SOC to begin within 24-48 hours post transition home. No further needs from CM at this time.                 Expected Discharge Plan: [place] Barriers to Discharge: No Barriers Identified   Patient Goals and CMS Choice Patient states their goals for this hospitalization and ongoing recovery are:: "to return home" CMS Medicare.gov Compare Post Acute Care list provided to:: Patient Choice offered to / list presented to : Patient  Expected Discharge Plan and Services Expected Discharge Plan: [place] In-house Referral: NA Discharge Planning Services: CM Consult Post Acute Care Choice: Home Health, Durable Medical Equipment Living arrangements for the past 2 months: Single Family Home                 DME Arranged: Oxygen DME Agency: [employer] Date DME Agency Contacted: 09/08/19 Time DME Agency Contacted: [id number] Representative spoke with at DME Agency: Caryl Pina HH Arranged: PT, OT, RN, Disease Management [place] Agency: [place] Date [place]: 09/08/19 Time [place]: [id number] Representative spoke with at [place] Arrangements/Services Living arrangements for the past 2 months: [place] Lives with:: Spouse Patient language and need for interpreter reviewed:: Yes Do you feel safe going back to the place where  you live?: Yes      Need for Family Participation in Patient Care: Yes (Comment) Care giver support system in place?: Yes (comment)   Criminal Activity/Legal Involvement Pertinent to Current Situation/Hospitalization: No - Comment as needed  Activities of Daily Living      Permission Sought/Granted Permission sought to share information with : Facility Sport and exercise psychologist, Family Supports Permission granted to share information with : Yes, Verbal Permission Granted     Permission granted to share info w AGENCY: [facility]- Oxygen, [facility]- HH Services        Emotional Assessment Appearance:: Appears stated age Attitude/Demeanor/Rapport: Engaged Affect (typically observed): Accepting Orientation: : Oriented to  Time, Oriented to Situation, Oriented to Self, Oriented to Place Alcohol / Substance Use: Not Applicable Psych Involvement: No (comment)  Admission diagnosis:  Acute on chronic respiratory failure with hypoxia (HCC) [J96.21] Dyspnea [R06.00] Patient Active Problem List   Diagnosis Date Noted  . Heart failure with preserved ejection fraction ([place]) 09/07/2019  . COPD exacerbation ([place]) 09/07/2019  . Dyspnea 09/04/2019  . Acute respiratory failure with hypoxia ([place]) 09/03/2019   PCP:  Blase Mess, PA-C Pharmacy:   [id number] [place], [address] [place] [address] Phone: [phone] Fax: [phone]     Social Determinants of Health (SDOH) Interventions    Readmission Risk Interventions No flowsheet data found.

## 2019-09-08 NOTE — Progress Notes (Addendum)
Subjective: Pt states that he continues to feel well. Pt excited to go home. No complaints. Objective:  Vital signs in last 24 hours: Vitals:   09/08/19 0831 09/08/19 0832 09/08/19 1020 09/08/19 1023  BP:      Pulse:      Resp:      Temp:      TempSrc:      SpO2: 94% 94% (!) 87% 91%  Weight:       General: NAD, alert & oriented Cardio: RRR, no murmurs, rubs or gallops Pulmonary: CTAB, no rales, rhonchi or wheezes MSK: BLE nontender, 1+ bilateral pitting edema Psych: Affect appropriate, engages well  CMP Latest Ref Rng & Units 09/08/2019 09/07/2019 09/06/2019  Glucose 70 - 99 mg/dL 106(Y) 694(W) 546(E)  BUN 8 - 23 mg/dL 70(J) 50(K) 93(G)  Creatinine 0.61 - 1.24 mg/dL 1.82 9.93 7.16  Sodium 135 - 145 mmol/L 137 139 141  Potassium 3.5 - 5.1 mmol/L 3.6 4.3 4.0  Chloride 98 - 111 mmol/L 91(L) 94(L) 92(L)  CO2 22 - 32 mmol/L 34(H) 36(H) 39(H)  Calcium 8.9 - 10.3 mg/dL 9.6(V) 8.9(F) 8.1(O)  Total Protein 6.5 - 8.1 g/dL - - 5.7(L)  Total Bilirubin 0.3 - 1.2 mg/dL - - 0.6  Alkaline Phos 38 - 126 U/L - - 56  AST 15 - 41 U/L - - 18  ALT 0 - 44 U/L - - 22   CBC Latest Ref Rng & Units 09/08/2019 09/06/2019 09/05/2019  WBC 4.0 - 10.5 K/uL 6.2 9.0 6.7  Hemoglobin 13.0 - 17.0 g/dL 17.5 12.3(L) 12.3(L)  Hematocrit 39.0 - 52.0 % 42.1 40.4 39.7  Platelets 150 - 400 K/uL 211 236 191    Patient Encounter Summary: Aaron Daniels is a 78 year old male with a PMHx notable for HTN, BPH s/p TURP, and significant tobacco use disorder history who presented to the ED with progressive bilateral lower extremity edema and shortness of breath of 2 months duration.  Initial presentation was concerning for heart failure exacerbation although his BNP was normal given the interstitial edema on chest x-ray.  Subsequent echocardiogram revealed a normal ejection fraction of the left ventricle and otherwise unremarkable cardiac features.  Concern for COPD exacerbation and HFpEF given improvement with steroids and volume  overload.  Assessment/Plan:  Principal Problem:   Acute respiratory failure with hypoxia (HCC) Active Problems:   Dyspnea   Heart failure with preserved ejection fraction (HCC)   COPD exacerbation (HCC)  ARF w/hypoxia   COPD exacerbation   HFpEF Stable. Pt reports continued improvement with inhaled triple therapy, diuresis and steroids. Pharmacy evaluated pt who demonstrated ability to use Ellipta device; discharge on Anoro due to likely Gold D COPD. ICS can be considered after outpatient PFTs. It is unclear if Aaron Daniels has had more than one COPD exacerbation in the last year.  - Pulmonology following, appreciate recs - Continue Azithromycin 500mg  to complete a five day course (ends 09/30) - Continue DuoNebs every 6 hours PRN for SOB - Pulmicort, Brovana, Atrovent (nebulizer) -> Anoro Ellipta - Continue flutter valve and incentive spirometry - Continue prednisone 40mg  to complete a five day course (ends 09/30) - Start furosemide 40mg  PO daily  - Strict I/O's and daily weights - Supplemental O2, target O2 sats between 88-92% - Patient will need PFTs once stabilized as an outpatient  Obstructive Sleep Apnea: Concern for OSA given history of snoring and chronic fatigue despite adequate sleep.  Diabetes mellitus type 2: Stable today. Levels within desired range. Continue SSI  Iron deficiency anemia: Patient Hgb improved 12.3 -> 13.1.   -Will need outpatient colonoscopy.   Diet: Heart healthy DVT prophylaxis: Lovenox Dispo: Anticipated discharge in approximately 2-3 day(s).   Flonnie Hailstone, Medical Student 09/08/2019, 11:24 AM Pager: # [phone]

## 2019-09-08 NOTE — Progress Notes (Signed)
NAME:  Aaron Daniels, MRN:  [id number], DOB:  Jan 23, 1941, LOS: 5 ADMISSION DATE:  09/03/2019, CONSULTATION DATE:  9/26  REFERRING MD: Dareen Piano, CHIEF COMPLAINT:  Dyspnea    Brief History   78 year old prior smoker admitted on 9/23 with what appeared to be acute on chronic hypoxic respiratory failure and volume overload pulmonary asked to evaluate on 9/26 for ongoing oxygen requirements.    History of present illness   This is a 78 year old male patient who was admitted on 9/23 with chief complaint of worsening shortness of breath with exertion, weight gain from 208 to 243 pounds, and worsening lower extremity edema.  He was seen by his primary care provider who noted ambulatory room air saturation of 85 to 86%.  Because of the symptoms he resented to the emergency department for further evaluation.  He was admitted with a working diagnosis of CHF, echocardiogram was ordered and IV diuresis was initiated. Hospital course: 9/24: Requiring increased supplemental oxygen.  Arterial blood gas showing progressive hypercarbia, placed on BiPAP  9/25: Echocardiogram: EF 65 to 70%, right atrial pressure estimated at 5 mmHg, ET chest obtained this was negative for pulmonary emboli.  Had wheezing on exam so placed on steroids and azithromycin for possible COPD exacerbation.  Oxygen levels dropping to 75% on 6 L during CT imaging. 9/26: Feeling better overall less short of breath.  Now 3.5 L negative.  Pulmonary consulted for ongoing oxygen requirements  Past Medical History  CKD,Hypertension, benign prostatic hypertrophy with prior TURP.  Obesity.  Significant Hospital Events   9/23 Admit  Consults:  Pulmonary consulted 9/26  Procedures:    Significant Diagnostic Tests:  Echocardiogram 9/25 EF 65 to 70% estimated CVP about 6.  No mention of diastolic function.  RA and RV appeared normal.  No estimation on pulmonary artery pressures. CT imaging: 9/25: Negative for PE, minimal basilar atelectasis VQ  scan 9/26 low probability for PE  Micro Data:  9/23 SARS CoV2 >> neg  Antimicrobials:  9/23 Doxy Azithromycin 9/25 >>  Interim history/subjective:  Net -4.8 L Remains on [place] 2L No complaints by patient, denies current SOB   Objective   Blood pressure 138/70, pulse 97, temperature 98.6 F (37 C), temperature source Oral, resp. rate 19, weight 100.2 kg, SpO2 94 %.    FiO2 (%):  [36 %] 36 %   Intake/Output Summary (Last 24 hours) at 09/08/2019 1020 Last data filed at 09/08/2019 1016 Gross per 24 hour  Intake 1200 ml  Output 1300 ml  Net -100 ml   Filed Weights   09/06/19 0500 09/07/19 0439 09/08/19 0508  Weight: 101.8 kg 98.8 kg 100.2 kg   Examination: General:  Very pleasant elderly male in NAD, talkative HEENT: MM pink/moist Neuro: Alert/ oriented, non focal  CV: rr, no murmur PULM:  Non labored, speaking without difficulty, clear anteriorly, diminished bases, no rales, no wheezing appreciated GI: soft, bs active  Extremities: warm/dry, no LE edema, some clubbing of fingers  Resolved Hospital Problem list     Assessment & Plan:  Acute on chronic hypoxic respiratory failure in the setting of what most likely decompensated diastolic heart failure with volume overload due to chronic hypoxia COPD (presumptive diagnosis), former heavy smoker Diastolic dysfunction Obesity Possible sleep apnea  Plan/recommendation Continue nebs, as MDIs are difficult for patient Ongoing Flutter valve/ IS, progress activity as tolerated Wean supplemental O2 for goal sat 88-92% Needs ambulatory walk test prior to d/c to determine home O2 needs Completing 5 day course of prednisone /  azithro (to complete 9/30) Daily lasix  Daily I/Os / weights Will need outpt pulmonary follow up, appointment scheduled with Buelah Manis in our office at 3pm on 10/5  Nothing further to add.  PCCM will sign off.  Please do not hesitate to call us back if we can be of any further assistance.  Labs   CBC:  Recent Labs  Lab 09/03/19 1705 09/03/19 1833 09/05/19 0333 09/06/19 0255 09/08/19 0848  WBC 5.4  --  6.7 9.0 6.2  NEUTROABS 4.1  --   --   --   --   HGB 12.5* 12.6* 12.3* 12.3* 13.1  HCT 40.3 37.0* 39.7 40.4 42.1  MCV 95.3  --  96.1 94.0 93.6  PLT 184  --  191 236 211    Basic Metabolic Panel: Recent Labs  Lab 09/04/19 0458 09/05/19 0333 09/06/19 0255 09/07/19 0448 09/08/19 0848  NA 140 142 141 139 137  K 4.4 3.7 4.0 4.3 3.6  CL 96* 94* 92* 94* 91*  CO2 34* 39* 39* 36* 34*  GLUCOSE 191* 112* 128* 101* 240*  BUN 15 22 32* 26* 26*  CREATININE 1.06 1.11 1.24 0.93 1.15  CALCIUM 8.6* 8.4* 8.7* 8.8* 8.8*  MG 2.0 2.2  --   --   --    GFR: Estimated Creatinine Clearance: 61.8 mL/min (by C-G formula based on SCr of 1.15 mg/dL). Recent Labs  Lab 09/03/19 1705 09/05/19 0333 09/06/19 0255 09/08/19 0848  WBC 5.4 6.7 9.0 6.2    Liver Function Tests: Recent Labs  Lab 09/03/19 1705 09/05/19 0333 09/06/19 0255  AST 20 17 18   ALT 24 21 22   ALKPHOS 60 55 56  BILITOT 0.4 0.7 0.6  PROT 5.9* 5.4* 5.7*  ALBUMIN 3.1* 2.9* 2.9*   No results for input(s): LIPASE, AMYLASE in the last 168 hours. No results for input(s): AMMONIA in the last 168 hours.  ABG    Component Value Date/Time   PHART 7.416 09/06/2019 0855   PCO2ART 62.3 (H) 09/06/2019 0855   PO2ART 80.2 (L) 09/06/2019 0855   HCO3 39.3 (H) 09/06/2019 0855   TCO2 37 (H) 09/03/2019 1833   O2SAT 95.9 09/06/2019 0855     Coagulation Profile: Recent Labs  Lab 09/04/19 0844  INR 1.1    Cardiac Enzymes: No results for input(s): CKTOTAL, CKMB, CKMBINDEX, TROPONINI in the last 168 hours.  HbA1C: Hgb A1c MFr Bld  Date/Time Value Ref Range Status  09/04/2019 06:12 PM 6.5 (H) 4.8 - 5.6 % Final    Comment:    (NOTE) Pre diabetes:          5.7%-6.4% Diabetes:              >6.4% Glycemic control for   <7.0% adults with diabetes     CBG: Recent Labs  Lab 09/07/19 0745 09/07/19 1114 09/07/19 1717 09/07/19  2112 09/08/19 0725  GLUCAP 89 154* 135* 172* 101*    2113, MSN, AGACNP-BC [facility] Pgr: [phone] or if no answer [phone] 09/08/2019, 11:06 AM

## 2019-09-09 DIAGNOSIS — J449 Chronic obstructive pulmonary disease, unspecified: Secondary | ICD-10-CM | POA: Diagnosis not present

## 2019-09-10 DIAGNOSIS — J9601 Acute respiratory failure with hypoxia: Secondary | ICD-10-CM | POA: Diagnosis not present

## 2019-09-10 DIAGNOSIS — J441 Chronic obstructive pulmonary disease with (acute) exacerbation: Secondary | ICD-10-CM | POA: Diagnosis not present

## 2019-09-10 DIAGNOSIS — J9602 Acute respiratory failure with hypercapnia: Secondary | ICD-10-CM | POA: Diagnosis not present

## 2019-09-10 DIAGNOSIS — I5030 Unspecified diastolic (congestive) heart failure: Secondary | ICD-10-CM | POA: Diagnosis not present

## 2019-09-10 DIAGNOSIS — I110 Hypertensive heart disease with heart failure: Secondary | ICD-10-CM | POA: Diagnosis not present

## 2019-09-11 ENCOUNTER — Ambulatory Visit (INDEPENDENT_AMBULATORY_CARE_PROVIDER_SITE_OTHER): Payer: Medicare Other | Admitting: Internal Medicine

## 2019-09-11 ENCOUNTER — Encounter: Payer: Self-pay | Admitting: Internal Medicine

## 2019-09-11 VITALS — BP 128/75 | HR 112 | Temp 98.3°F | Wt 232.3 lb

## 2019-09-11 DIAGNOSIS — F17200 Nicotine dependence, unspecified, uncomplicated: Secondary | ICD-10-CM | POA: Diagnosis not present

## 2019-09-11 DIAGNOSIS — Z7951 Long term (current) use of inhaled steroids: Secondary | ICD-10-CM

## 2019-09-11 DIAGNOSIS — I110 Hypertensive heart disease with heart failure: Secondary | ICD-10-CM | POA: Diagnosis not present

## 2019-09-11 DIAGNOSIS — J449 Chronic obstructive pulmonary disease, unspecified: Secondary | ICD-10-CM

## 2019-09-11 DIAGNOSIS — I5030 Unspecified diastolic (congestive) heart failure: Secondary | ICD-10-CM

## 2019-09-11 DIAGNOSIS — I10 Essential (primary) hypertension: Secondary | ICD-10-CM | POA: Insufficient documentation

## 2019-09-11 DIAGNOSIS — Z9981 Dependence on supplemental oxygen: Secondary | ICD-10-CM

## 2019-09-11 DIAGNOSIS — Z79899 Other long term (current) drug therapy: Secondary | ICD-10-CM

## 2019-09-11 MED ORDER — FUROSEMIDE 20 MG PO TABS: 40.0000 mg | ORAL_TABLET | Freq: Every day | ORAL | 0 refills | Status: DC

## 2019-09-11 NOTE — Assessment & Plan Note (Addendum)
Patient is following up after hospital admission. His weight was 221 on discharge and 232 today. Patient has bilateral pitting edema midway to his knee. Rales on exam. He is drinking a lot of sweet tea and believes he is adding some fluid.Reports he doesn't think he is responding to lasix as well.  Patient educated on fluid restrictions and low salt diet. In addition he should weigh himself daily and call clinic if he gains 3lbs in a day or 5lbs in a week.  - Increase Lasix from 20 to 40 mg daily - follow up in 4 weeks

## 2019-09-11 NOTE — Assessment & Plan Note (Signed)
Patient with recent admission for COPD exacerbation. Finished azithromycin and prednisone today. He smoked for 56 years. Has follow up with [facility].  - PFTS in 4 weeks - Continue Ellipta  - Continue Albuterol as needed - On supplemental oxygen 3L

## 2019-09-11 NOTE — Patient Instructions (Addendum)
Mr. Aaron Daniels ,  It was a pleasure to meet you today.  Today we talked about the following:  Heart failure - I will increase your Lasix dose to 40 mg daily.  You have signs of fluid on your legs.  We can follow-up in 4 weeks to reassess.  Weigh yourself daily and if you gain 3 pounds in one day or more than 5 pounds in a week, please contact your primary doctor.   COPD -I will put a referral in for you to have pulmonary function test.  You will receive a call to set up an appointment.  High blood pressure Your blood pressure is 128/75 today.  This is a normal reading,  continue your lisinopril 10 mg.  My best,  Tamsen Snider, MD

## 2019-09-11 NOTE — Progress Notes (Signed)
   CC: Recent COPD exacerbation and heart failure  HPI:[name] is a 78 y.o. with PMhx of COPD, HTN, and HFpEF. Patient was seen for HFpEF, COPD, and HTN today. See problem based charting for details of today's visit.   Past Medical History:  Diagnosis Date  . COPD (chronic obstructive pulmonary disease) ([place])   . Hypertension    Review of Systems:  No fever, SOB without oxygen, no abdominal pain   Vitals:   09/11/19 1537  BP: 128/75  Pulse: (!) 112  Temp: 98.3 F (36.8 C)  TempSrc: Oral  SpO2: 92%  Weight: 232 lb 4.8 oz (105.4 kg)   Physical Exam:  Physical Exam Constitutional:      Appearance: He is obese.     Comments: Wearing oxygen on 3L   Pulmonary:     Effort: No respiratory distress.     Breath sounds: Rhonchi and rales present. No wheezing.  Abdominal:     General: Bowel sounds are normal.     Palpations: Abdomen is soft.  Musculoskeletal:     Right lower leg: Edema present.     Left lower leg: Edema present.  Neurological:     Mental Status: He is alert.     Assessment & Plan:   See Encounters Tab for problem based charting.  Patient seen with Dr. Evette Doffing

## 2019-09-14 DIAGNOSIS — J441 Chronic obstructive pulmonary disease with (acute) exacerbation: Secondary | ICD-10-CM | POA: Diagnosis not present

## 2019-09-14 DIAGNOSIS — J9601 Acute respiratory failure with hypoxia: Secondary | ICD-10-CM | POA: Diagnosis not present

## 2019-09-14 DIAGNOSIS — I5030 Unspecified diastolic (congestive) heart failure: Secondary | ICD-10-CM | POA: Diagnosis not present

## 2019-09-14 DIAGNOSIS — I110 Hypertensive heart disease with heart failure: Secondary | ICD-10-CM | POA: Diagnosis not present

## 2019-09-14 DIAGNOSIS — J9602 Acute respiratory failure with hypercapnia: Secondary | ICD-10-CM | POA: Diagnosis not present

## 2019-09-14 NOTE — Progress Notes (Signed)
Internal Medicine Clinic Attending  I saw and evaluated the patient.  I personally confirmed the key portions of the history and exam documented by Dr. Steen and I reviewed pertinent patient test results.  The assessment, diagnosis, and plan were formulated together and I agree with the documentation in the resident's note.     

## 2019-09-15 ENCOUNTER — Other Ambulatory Visit: Payer: Self-pay

## 2019-09-15 ENCOUNTER — Encounter: Payer: Self-pay | Admitting: Primary Care

## 2019-09-15 ENCOUNTER — Ambulatory Visit: Payer: Medicare Other | Admitting: Primary Care

## 2019-09-15 VITALS — BP 126/72 | HR 110 | Temp 98.2°F | Ht 70.5 in | Wt 230.0 lb

## 2019-09-15 DIAGNOSIS — J449 Chronic obstructive pulmonary disease, unspecified: Secondary | ICD-10-CM | POA: Diagnosis not present

## 2019-09-15 DIAGNOSIS — J9611 Chronic respiratory failure with hypoxia: Secondary | ICD-10-CM

## 2019-09-15 DIAGNOSIS — R0683 Snoring: Secondary | ICD-10-CM | POA: Diagnosis not present

## 2019-09-15 MED ORDER — ALBUTEROL SULFATE (2.5 MG/3ML) 0.083% IN NEBU: 2.5000 mg | INHALATION_SOLUTION | Freq: Four times a day (QID) | RESPIRATORY_TRACT | 1 refills | Status: DC | PRN

## 2019-09-15 NOTE — Assessment & Plan Note (Signed)
-   Continues home oxygen with [employer]

## 2019-09-15 NOTE — Patient Instructions (Addendum)
  COPD: - Continue Anoro 1 puffs daily - Use Albuterol nebulizer every 6 hours as needed for shortness of breath  Heart failure: - Continue Lasix 40mg  daily  - Monitor weight daily is gain 3lbs in 1 day or 5lb in week notify internal medicine - Recommend Compression stockings (try [employer])  Orders: - New nebulizer machine  - Needs in-lab sleep test EU:MPNTIRW/ERX time fatigue - Needs PFTs in the next 2-4 weeks   Follow-up: - 4 weeks (or first available) with new [facility] pulmonary provider only

## 2019-09-15 NOTE — Assessment & Plan Note (Signed)
-   Epworth 15 - Needs in-lab sleep test

## 2019-09-15 NOTE — Assessment & Plan Note (Addendum)
-   Continue Anoro 1 puff daily - Adding Albuterol nebulizer q 6 hours prn sob - Needs PFT 2-4 weeks  - FU in 4 weeks with new [facility] pulmonary provider

## 2019-09-15 NOTE — Progress Notes (Signed)
Reviewed and agree with assessment/plan.   Atul Delucia, MD [facility] 12/06/2016, 12:24 PM Pager:  [phone]  

## 2019-09-15 NOTE — Progress Notes (Signed)
@Patient  ID: , male    DOB: 28-Jul-1941, 78 y.o.   MRN: [id number]  Chief Complaint  Patient presents with   Hospitalization Follow-up    Referring provider: [id number]  HPI: 78 year old male, former smoker quit in 2013. PMH significant for COPD, acute respiratory failure with hypoxia, heart failure, hypertension. Patient seen for initial consult in-patient by Dr. 2014.   Hospital Course: Admitted with lower extremity edema and progressive shortness of breath x 2 months. Treated for COPD/CHF exacerbation. BNP 84, Echocardiogram showed EF 65-70%. He was given IV diuresis and steriods. Completed course of azithromycin. Concern for sleep apnea d/t history of snoring and fatigue. Discharge on furosemide, prednisone and ANORO.   09/15/2019 Patient presents today for hospital follow-up. Accompanied by his wife and son. He is doing well since discharge. Feels Anoro was helped his breathing. He has not needed his Albuterol rescue inhaler, reports no benefit from it. His current weight is 230lbs (admission weight 243lbs; discharge weight 221/227lbs at home). Following with internal medicine Dr. 11/15/2019 seen on 09/11/19. Lasix increased to 40mg  daily. Son feels his weight is maintaining but not losing. Denies shortness of breath or wheezing. Afebrile.   Son12/1/20 [phone] Allergies  Allergen Reactions   Codeine    Penicillins    Sulfa Antibiotics      There is no immunization history on file for this patient.  Past Medical History:  Diagnosis Date   COPD (chronic obstructive pulmonary disease) (HCC)    Hypertension     Tobacco History: Social History   Tobacco Use  Smoking Status Former Smoker   Quit date: 07/13/2012   Years since quitting: 7.1  Smokeless Tobacco Never Used   Counseling given: Not Answered   Outpatient Medications Prior to Visit  Medication Sig Dispense Refill   acetaminophen (TYLENOL) 325 MG tablet Take 650 mg by  mouth every 6 (six) hours as needed for mild pain.     albuterol (VENTOLIN HFA) 108 (90 Base) MCG/ACT inhaler Inhale 3 puffs into the lungs every 6 (six) hours as needed for wheezing or shortness of breath.     furosemide (LASIX) 20 MG tablet Take 2 tablets (40 mg total) by mouth daily. 90 tablet 0   lisinopril (ZESTRIL) 10 MG tablet Take 10 mg by mouth daily.     umeclidinium-vilanterol (ANORO ELLIPTA) 62.5-25 MCG/INH AEPB Inhale 1 puff into the lungs daily. 1 each 2   azithromycin (ZITHROMAX) 250 MG tablet Take 1 tablet (250 mg total) by mouth daily. (Patient not taking: Reported on 09/15/2019) 3 each 0   predniSONE (DELTASONE) 20 MG tablet Take 2 tablets (40 mg total) by mouth daily with breakfast. Until you complete the course (Patient not taking: Reported on 09/15/2019) 6 tablet 0   No facility-administered medications prior to visit.    Review of Systems  Review of Systems  Constitutional:       Daytime fatigue  Respiratory: Positive for cough. Negative for shortness of breath and wheezing.   Cardiovascular: Positive for leg swelling.  Psychiatric/Behavioral: Positive for sleep disturbance.   Physical Exam  BP 126/72 (BP Location: Right Arm, Cuff Size: Large)    Pulse (!) 110    Temp 98.2 F (36.8 C) (Oral)    Ht 5' 10.5" (1.791 m)    Wt 230 lb (104.3 kg)    SpO2 92%    BMI 32.54 kg/m  Physical Exam Constitutional:      Appearance: Normal appearance.  Comments: Looks well today, no distress  HENT:     Head: Normocephalic and atraumatic.     Mouth/Throat:     Mouth: Mucous membranes are moist.     Pharynx: Oropharynx is clear.     Comments: Poor dentition  Neck:     Musculoskeletal: Normal range of motion and neck supple.  Cardiovascular:     Rate and Rhythm: Normal rate and regular rhythm.     Comments: +1 BLE edema Pulmonary:     Effort: Pulmonary effort is normal. No respiratory distress.     Breath sounds: No wheezing or rhonchi.  Skin:    General: Skin is  warm and dry.  Neurological:     General: No focal deficit present.     Mental Status: He is alert and oriented to person, place, and time. Mental status is at baseline.  Psychiatric:        Mood and Affect: Mood normal.        Behavior: Behavior normal.        Thought Content: Thought content normal.        Judgment: Judgment normal.      Lab Results:  CBC    Component Value Date/Time   WBC 6.2 09/08/2019 0848   RBC 4.50 09/08/2019 0848   HGB 13.1 09/08/2019 0848   HCT 42.1 09/08/2019 0848   PLT 211 09/08/2019 0848   MCV 93.6 09/08/2019 0848   MCH 29.1 09/08/2019 0848   MCHC 31.1 09/08/2019 0848   RDW 14.6 09/08/2019 0848   LYMPHSABS 0.7 09/03/2019 1705   MONOABS 0.4 09/03/2019 1705   EOSABS 0.2 09/03/2019 1705   BASOSABS 0.0 09/03/2019 1705    BMET    Component Value Date/Time   NA 137 09/08/2019 0848   K 3.6 09/08/2019 0848   CL 91 (L) 09/08/2019 0848   CO2 34 (H) 09/08/2019 0848   GLUCOSE 240 (H) 09/08/2019 0848   BUN 26 (H) 09/08/2019 0848   CREATININE 1.15 09/08/2019 0848   CALCIUM 8.8 (L) 09/08/2019 0848   GFRNONAA 60 (L) 09/08/2019 0848   GFRAA >60 09/08/2019 0848    BNP    Component Value Date/Time   BNP 85.2 09/03/2019 1726    ProBNP No results found for: PROBNP  Imaging: Dg Chest 2 View  Result Date: 09/06/2019 CLINICAL DATA:  Shortness of breath EXAM: CHEST - 2 VIEW COMPARISON:  09/04/2019 FINDINGS: Lungs are hyperexpanded. Interstitial markings are diffusely coarsened with chronic features. Basilar scarring again noted. Nodular opacity projects over the posterior right sixth rib, not definitely seen on prior study. The cardiopericardial silhouette is within normal limits for size. The visualized bony structures of the thorax are intact. Telemetry leads overlie the chest. IMPRESSION: 1. Emphysema with basilar scarring. 2. Nodular density at the right base. Patient had chest CT yesterday revealing no nodule at this location. Features on today's  exam likely related to a confluence of shadows. Electronically Signed   By: Misty Stanley M.D.   On: 09/06/2019 11:41   Ct Angio Chest Pe W Or Wo Contrast  Result Date: 09/05/2019 CLINICAL DATA:  Shortness of breath EXAM: CT ANGIOGRAPHY CHEST WITH CONTRAST TECHNIQUE: Multidetector CT imaging of the chest was performed using the standard protocol during bolus administration of intravenous contrast. Multiplanar CT image reconstructions and MIPs were obtained to evaluate the vascular anatomy. CONTRAST:  65mL OMNIPAQUE IOHEXOL 350 MG/ML SOLN COMPARISON:  CT dated July 04, 2019. FINDINGS: Cardiovascular: Evaluation is severely limited by suboptimal contrast bolus timing  and significant respiratory motion artifact.Given these limitations, no pulmonary embolism was detected. The main pulmonary artery is within normal limits for size. There is no CT evidence of acute right heart strain. There are atherosclerotic changes of the thoracic aorta without evidence for dissection. Heart size is normal, without pericardial effusion. Mediastinum/Nodes: --No mediastinal or hilar lymphadenopathy. --No axillary lymphadenopathy. --No supraclavicular lymphadenopathy. --Normal thyroid gland. --The esophagus is unremarkable Lungs/Pleura: There is atelectasis at the lung bases. There is no pleural effusion. No pneumothorax. The trachea is unremarkable. Upper Abdomen: No acute abnormality. Musculoskeletal: No chest wall abnormality. No acute or significant osseous findings. There is a subacute fracture involving the ninth rib posterolaterally on the left. Review of the MIP images confirms the above findings. IMPRESSION: 1. Evaluation is severely limited by suboptimal contrast bolus timing and significant respiratory motion artifact. Given these limitations, no pulmonary embolism was detected. 2. Bibasilar atelectasis. Aortic Atherosclerosis (ICD10-I70.0). Electronically Signed   By: Katherine Mantlehristopher  Green M.D.   On: 09/05/2019 09:48   Nm  Pulmonary Perf And Vent  Result Date: 09/06/2019 CLINICAL DATA:  Hypoxemia and respiratory failure. EXAM: NUCLEAR MEDICINE VENTILATION - PERFUSION LUNG SCAN TECHNIQUE: Ventilation images were obtained in multiple projections using inhaled aerosol Tc-5715m DTPA. Perfusion images were obtained in multiple projections after intravenous injection of Tc-3115m MAA. RADIOPHARMACEUTICALS:  39.3 mCi of Tc-1915m DTPA aerosol inhalation and 1.6 mCi Tc3615m MAA IV COMPARISON:  09/04/2019 FINDINGS: Ventilation: There is a large segmental ventilation defect involving the superior segment of right lower lobe. Perfusion: Corresponding to the ventilation abnormality is a large a ventilation defect within the superior segment of right lower lobe. No unmatched wedge shaped peripheral perfusion defects to suggest acute pulmonary embolism. No corresponding chest radiograph abnormality. IMPRESSION: Low probability for acute pulmonary embolus. Electronically Signed   By: Signa Kellaylor  Stroud M.D.   On: 09/06/2019 10:50   Dg Chest Port 1 View  Result Date: 09/04/2019 CLINICAL DATA:  78 year old male with a history of shortness of breath EXAM: PORTABLE CHEST 1 VIEW COMPARISON:  September 03, 2011 FINDINGS: Cardiomediastinal silhouette unchanged in size and contour. Low lung volumes. Interlobular septal thickening, relatively similar to the prior with linear opacities at the lung bases. No pneumothorax. No confluent airspace disease. No displaced fracture. IMPRESSION: Low lung volumes with interlobular septal thickening, either chronic changes related to prior episodes of CHF, or potentially early pulmonary edema. Basilar atelectasis and/or small pleural effusions. Electronically Signed   By: Gilmer MorJaime  Wagner D.O.   On: 09/04/2019 20:55   Dg Chest Port 1 View  Result Date: 09/03/2019 CLINICAL DATA:  Shortness of breath and cough.  Body swelling. EXAM: PORTABLE CHEST 1 VIEW COMPARISON:  08/14/2019 FINDINGS: Heart size upper limits of normal. Chronic  aortic atherosclerosis. Possible venous hypertension without frank edema. Interstitial markings at the lung bases most consistent with interstitial edema. Similar appearance to the most previous exam. IMPRESSION: Pulmonary venous hypertension and mild interstitial edema, similar to the study of 08/14/2019. Electronically Signed   By: Paulina FusiMark  Shogry M.D.   On: 09/03/2019 17:56     Assessment & Plan:   Snoring - Epworth 15 - Needs in-lab sleep test  COPD (chronic obstructive pulmonary disease) (HCC) - Continue Anoro 1 puff daily - Adding Albuterol nebulizer q 6 hours prn sob - Needs PFT 2-4 weeks  - FU in 4 weeks with new [facility] pulmonary provider  Chronic respiratory failure with hypoxia (HCC) - Continues home oxygen with [employer]  Heart failure with preserved ejection fraction (HCC) - Respiratory symptoms  stable; +1 BLE edema  - Weight 230lbs today (221-227lbs discharge weight) - Continues Lasix  daily  - Encouraged compression stockings  - Follow up with internal medicine for diuretic management    Glenford Bayley, NP 09/15/2019

## 2019-09-15 NOTE — Assessment & Plan Note (Addendum)
-   Respiratory symptoms stable; +1 BLE edema  - Weight 230lbs today (221-227lbs discharge weight) - Continues Lasix 40mg  daily  - Encouraged compression stockings  - Follow up with internal medicine for diuretic management

## 2019-09-17 ENCOUNTER — Ambulatory Visit (INDEPENDENT_AMBULATORY_CARE_PROVIDER_SITE_OTHER): Payer: Medicare Other | Admitting: Internal Medicine

## 2019-09-17 ENCOUNTER — Telehealth: Payer: Self-pay

## 2019-09-17 ENCOUNTER — Other Ambulatory Visit: Payer: Self-pay

## 2019-09-17 ENCOUNTER — Telehealth: Payer: Self-pay | Admitting: Primary Care

## 2019-09-17 DIAGNOSIS — I5030 Unspecified diastolic (congestive) heart failure: Secondary | ICD-10-CM | POA: Diagnosis not present

## 2019-09-17 DIAGNOSIS — R159 Full incontinence of feces: Secondary | ICD-10-CM | POA: Diagnosis not present

## 2019-09-17 DIAGNOSIS — J441 Chronic obstructive pulmonary disease with (acute) exacerbation: Secondary | ICD-10-CM

## 2019-09-17 DIAGNOSIS — J449 Chronic obstructive pulmonary disease, unspecified: Secondary | ICD-10-CM

## 2019-09-17 DIAGNOSIS — R197 Diarrhea, unspecified: Secondary | ICD-10-CM | POA: Diagnosis not present

## 2019-09-17 DIAGNOSIS — Z79899 Other long term (current) drug therapy: Secondary | ICD-10-CM | POA: Diagnosis not present

## 2019-09-17 NOTE — Assessment & Plan Note (Signed)
HPI:  Seen on Friday. Lasix were increased from 20 mg to 40 mg. Since that time his weight has remained stable at 230lbs. He continues to have LE edema. Patient drinks in excessive of 2 galloons of fluid per day. Does not avoid salt.   A/P: - Advised fluid restriction  - Patient voiced understanding

## 2019-09-17 NOTE — Assessment & Plan Note (Deleted)
HPI: Patient's son called with concerns of diarrhea. States that he has used the restroom multiple times over the past 3 days. Overnight he had an accident where he woke up with stool in his bed. Still this morning he has had not further episodes of loose stool and no fevers. Denies infectious symptoms.   A/P: - Continued observation. Discussed that COVID can present with GI symptoms. Recommended that if the patient develops fevers, SHOB, cough, or worsening diarrhea to call back or go to the ED.

## 2019-09-17 NOTE — Progress Notes (Signed)
   CC: Diarrhea  This is a telephone encounter between Aaron Daniels and Aaron Daniels on 09/17/2019 for Diarrhea. The visit was conducted with the patient located at home and [facility] at [facility]. The patient's identity was confirmed using their DOB and current address. The patient has consented to being evaluated through a telephone encounter and understands the associated risks (an examination cannot be done and the patient may need to come in for an appointment) / benefits (allows the patient to remain at home, decreasing exposure to coronavirus). I personally spent 15 minutes on medical discussion.   HPI:  [name] is a 78 y.o. with PMH as below.   Please see A&P for assessment of the patient's acute and chronic medical conditions.   Past Medical History:  Diagnosis Date  . COPD (chronic obstructive pulmonary disease) ([place])   . Hypertension    Review of Systems:  Performed and all others negative.  Assessment & Plan:   See Encounters Tab for problem based charting.  Patient discussed with Dr. Dareen Daniels

## 2019-09-17 NOTE — Telephone Encounter (Signed)
Pt's son requesting to speak with a nurse about meds, and other problems. Please call back.

## 2019-09-17 NOTE — Assessment & Plan Note (Signed)
HPI: Patient's son called with concerns of diarrhea. States that he has used the restroom multiple times over the past 3 days. Overnight he had an accident where he woke up with stool in his bed. Still this morning he has had not further episodes of loose stool and no fevers. Denies infectious symptoms.   A/P: - Continued observation. Discussed that COVID can present with GI symptoms. Recommended that if the patient develops fevers, SHOB, cough, or worsening diarrhea to call back or go to the ED.  

## 2019-09-17 NOTE — Telephone Encounter (Signed)
Attempted to contact patient son, voice mail is full. Will place order to be sent to [facility] per patient request. Will update once he calls back. Will hold in triage.

## 2019-09-17 NOTE — Telephone Encounter (Signed)
Son states pt has diarrhea for appr 3 days. He states pt is now eating little and having lots of diarrhea. He is unable to truly describe it except in very common terms but not odor, consistency or blood. Does not know if there are fevers. Does not like HH coming in and does not truly understand that pt needs more care.  He is reluctant to come for office appt. telehealth w/ dr Court Joy as pt 's son has requested him

## 2019-09-17 NOTE — Telephone Encounter (Signed)
Dr.Helberg was on the phone with patient when I called.

## 2019-09-18 ENCOUNTER — Telehealth: Payer: Self-pay | Admitting: *Deleted

## 2019-09-18 NOTE — Telephone Encounter (Signed)
Aaron Daniels from [facility] calls and states they are enrolling pt in palliative care program, wanted a VO to continue, GIVEN, attending listed dr Dareen Piano, pcp dr Court Joy. Do you agree?

## 2019-09-18 NOTE — Telephone Encounter (Signed)
Pt son returned call and would like a call back

## 2019-09-18 NOTE — Telephone Encounter (Signed)
Pt's son returning call.  [phone].

## 2019-09-18 NOTE — Telephone Encounter (Signed)
Pt's son states to keep trying a couple of times back to back.

## 2019-09-18 NOTE — Telephone Encounter (Signed)
I agree

## 2019-09-18 NOTE — Telephone Encounter (Signed)
ATC pt's son, [name]. I could not leave a message due to his voicemail being full. Will try back.

## 2019-09-18 NOTE — Telephone Encounter (Signed)
Spoke with patient, Aaron Daniels. Advised him that the order for the neb machine was sent to [facility] yesterday and we received a confirmation that [facility] received the order. Also let him know that we sent an order to [facility] to cancel the order. He verbalized understanding.   Nothing further needed at time of call.

## 2019-09-19 DIAGNOSIS — J449 Chronic obstructive pulmonary disease, unspecified: Secondary | ICD-10-CM | POA: Diagnosis not present

## 2019-09-19 NOTE — Progress Notes (Signed)
Internal Medicine Clinic Attending  Case discussed with Dr. Helberg at the time of the visit.  We reviewed the resident's history and exam and pertinent patient test results.  I agree with the assessment, diagnosis, and plan of care documented in the resident's note.    

## 2019-09-21 ENCOUNTER — Emergency Department (HOSPITAL_COMMUNITY): Payer: Medicare Other

## 2019-09-21 ENCOUNTER — Other Ambulatory Visit: Payer: Self-pay

## 2019-09-21 ENCOUNTER — Encounter (HOSPITAL_COMMUNITY): Payer: Self-pay | Admitting: Internal Medicine

## 2019-09-21 ENCOUNTER — Inpatient Hospital Stay (HOSPITAL_COMMUNITY)
Admission: EM | Admit: 2019-09-21 | Discharge: 2019-09-30 | DRG: 177 | Disposition: A | Discharge status: Home Health | Payer: Medicare Other | Attending: Internal Medicine | Admitting: Internal Medicine

## 2019-09-21 ENCOUNTER — Inpatient Hospital Stay (HOSPITAL_COMMUNITY): Payer: Medicare Other

## 2019-09-21 DIAGNOSIS — J440 Chronic obstructive pulmonary disease with acute lower respiratory infection: Secondary | ICD-10-CM | POA: Diagnosis not present

## 2019-09-21 DIAGNOSIS — N170 Acute kidney failure with tubular necrosis: Secondary | ICD-10-CM | POA: Diagnosis present

## 2019-09-21 DIAGNOSIS — Z9981 Dependence on supplemental oxygen: Secondary | ICD-10-CM

## 2019-09-21 DIAGNOSIS — Z79899 Other long term (current) drug therapy: Secondary | ICD-10-CM | POA: Diagnosis not present

## 2019-09-21 DIAGNOSIS — N401 Enlarged prostate with lower urinary tract symptoms: Secondary | ICD-10-CM | POA: Diagnosis present

## 2019-09-21 DIAGNOSIS — E860 Dehydration: Secondary | ICD-10-CM | POA: Diagnosis present

## 2019-09-21 DIAGNOSIS — I5032 Chronic diastolic (congestive) heart failure: Secondary | ICD-10-CM | POA: Diagnosis present

## 2019-09-21 DIAGNOSIS — D649 Anemia, unspecified: Secondary | ICD-10-CM | POA: Diagnosis not present

## 2019-09-21 DIAGNOSIS — Z833 Family history of diabetes mellitus: Secondary | ICD-10-CM

## 2019-09-21 DIAGNOSIS — U071 COVID-19: Principal | ICD-10-CM | POA: Diagnosis present

## 2019-09-21 DIAGNOSIS — Z66 Do not resuscitate: Secondary | ICD-10-CM | POA: Diagnosis not present

## 2019-09-21 DIAGNOSIS — Y92230 Patient room in hospital as the place of occurrence of the external cause: Secondary | ICD-10-CM | POA: Diagnosis not present

## 2019-09-21 DIAGNOSIS — E1165 Type 2 diabetes mellitus with hyperglycemia: Secondary | ICD-10-CM | POA: Diagnosis not present

## 2019-09-21 DIAGNOSIS — R338 Other retention of urine: Secondary | ICD-10-CM | POA: Diagnosis not present

## 2019-09-21 DIAGNOSIS — Z8249 Family history of ischemic heart disease and other diseases of the circulatory system: Secondary | ICD-10-CM

## 2019-09-21 DIAGNOSIS — R739 Hyperglycemia, unspecified: Secondary | ICD-10-CM | POA: Diagnosis not present

## 2019-09-21 DIAGNOSIS — R0902 Hypoxemia: Secondary | ICD-10-CM | POA: Diagnosis not present

## 2019-09-21 DIAGNOSIS — I110 Hypertensive heart disease with heart failure: Secondary | ICD-10-CM | POA: Diagnosis present

## 2019-09-21 DIAGNOSIS — L89899 Pressure ulcer of other site, unspecified stage: Secondary | ICD-10-CM | POA: Diagnosis not present

## 2019-09-21 DIAGNOSIS — E861 Hypovolemia: Secondary | ICD-10-CM | POA: Diagnosis not present

## 2019-09-21 DIAGNOSIS — A084 Viral intestinal infection, unspecified: Secondary | ICD-10-CM | POA: Diagnosis not present

## 2019-09-21 DIAGNOSIS — Z885 Allergy status to narcotic agent status: Secondary | ICD-10-CM

## 2019-09-21 DIAGNOSIS — G4733 Obstructive sleep apnea (adult) (pediatric): Secondary | ICD-10-CM | POA: Diagnosis not present

## 2019-09-21 DIAGNOSIS — E875 Hyperkalemia: Secondary | ICD-10-CM | POA: Diagnosis not present

## 2019-09-21 DIAGNOSIS — E669 Obesity, unspecified: Secondary | ICD-10-CM | POA: Diagnosis present

## 2019-09-21 DIAGNOSIS — R7401 Elevation of levels of liver transaminase levels: Secondary | ICD-10-CM | POA: Diagnosis not present

## 2019-09-21 DIAGNOSIS — J1289 Other viral pneumonia: Secondary | ICD-10-CM | POA: Diagnosis present

## 2019-09-21 DIAGNOSIS — I959 Hypotension, unspecified: Secondary | ICD-10-CM | POA: Diagnosis not present

## 2019-09-21 DIAGNOSIS — Z87891 Personal history of nicotine dependence: Secondary | ICD-10-CM

## 2019-09-21 DIAGNOSIS — T380X5A Adverse effect of glucocorticoids and synthetic analogues, initial encounter: Secondary | ICD-10-CM | POA: Diagnosis not present

## 2019-09-21 DIAGNOSIS — Z6831 Body mass index (BMI) 31.0-31.9, adult: Secondary | ICD-10-CM | POA: Diagnosis not present

## 2019-09-21 DIAGNOSIS — J9601 Acute respiratory failure with hypoxia: Secondary | ICD-10-CM | POA: Diagnosis not present

## 2019-09-21 DIAGNOSIS — Z880 Allergy status to penicillin: Secondary | ICD-10-CM

## 2019-09-21 DIAGNOSIS — R000 Tachycardia, unspecified: Secondary | ICD-10-CM | POA: Diagnosis not present

## 2019-09-21 DIAGNOSIS — J9621 Acute and chronic respiratory failure with hypoxia: Secondary | ICD-10-CM | POA: Diagnosis not present

## 2019-09-21 DIAGNOSIS — R0602 Shortness of breath: Secondary | ICD-10-CM | POA: Diagnosis not present

## 2019-09-21 DIAGNOSIS — Z882 Allergy status to sulfonamides status: Secondary | ICD-10-CM

## 2019-09-21 LAB — COMPREHENSIVE METABOLIC PANEL
ALT: 61 U/L — ABNORMAL HIGH (ref 0–44)
AST: 78 U/L — ABNORMAL HIGH (ref 15–41)
Albumin: 2.3 g/dL — ABNORMAL LOW (ref 3.5–5.0)
Alkaline Phosphatase: 48 U/L (ref 38–126)
Anion gap: 11 (ref 5–15)
BUN: 19 mg/dL (ref 8–23)
CO2: 27 mmol/L (ref 22–32)
Calcium: 8 mg/dL — ABNORMAL LOW (ref 8.9–10.3)
Chloride: 97 mmol/L — ABNORMAL LOW (ref 98–111)
Creatinine, Ser: 1.38 mg/dL — ABNORMAL HIGH (ref 0.61–1.24)
GFR calc Af Amer: 56 mL/min — ABNORMAL LOW (ref >60)
GFR calc non Af Amer: 48 mL/min — ABNORMAL LOW (ref >60)
Glucose, Bld: 151 mg/dL — ABNORMAL HIGH (ref 70–99)
Potassium: 4.3 mmol/L (ref 3.5–5.1)
Sodium: 135 mmol/L (ref 135–145)
Total Bilirubin: 0.3 mg/dL (ref 0.3–1.2)
Total Protein: 5.6 g/dL — ABNORMAL LOW (ref 6.5–8.1)

## 2019-09-21 LAB — CBC WITH DIFFERENTIAL/PLATELET
Abs Immature Granulocytes: 0.04 10*3/uL (ref 0.00–0.07)
Basophils Absolute: 0 10*3/uL (ref 0.0–0.1)
Basophils Relative: 0 %
Eosinophils Absolute: 0 10*3/uL (ref 0.0–0.5)
Eosinophils Relative: 0 %
HCT: 41.3 % (ref 39.0–52.0)
Hemoglobin: 12.8 g/dL — ABNORMAL LOW (ref 13.0–17.0)
Immature Granulocytes: 1 %
Lymphocytes Relative: 8 %
Lymphs Abs: 0.6 10*3/uL — ABNORMAL LOW (ref 0.7–4.0)
MCH: 29 pg (ref 26.0–34.0)
MCHC: 31 g/dL (ref 30.0–36.0)
MCV: 93.4 fL (ref 80.0–100.0)
Monocytes Absolute: 0.2 10*3/uL (ref 0.1–1.0)
Monocytes Relative: 3 %
Neutro Abs: 5.9 10*3/uL (ref 1.7–7.7)
Neutrophils Relative %: 88 %
Platelets: 180 10*3/uL (ref 150–400)
RBC: 4.42 MIL/uL (ref 4.22–5.81)
RDW: 15.8 % — ABNORMAL HIGH (ref 11.5–15.5)
WBC: 6.8 10*3/uL (ref 4.0–10.5)
nRBC: 0 % (ref 0.0–0.2)

## 2019-09-21 LAB — POCT I-STAT 7, (LYTES, BLD GAS, ICA,H+H)
Acid-Base Excess: 4 mmol/L — ABNORMAL HIGH (ref 0.0–2.0)
Bicarbonate: 29.4 mmol/L — ABNORMAL HIGH (ref 20.0–28.0)
Calcium, Ion: 1.13 mmol/L — ABNORMAL LOW (ref 1.15–1.40)
HCT: 35 % — ABNORMAL LOW (ref 39.0–52.0)
Hemoglobin: 11.9 g/dL — ABNORMAL LOW (ref 13.0–17.0)
O2 Saturation: 84 %
Patient temperature: 98.7
Potassium: 3.9 mmol/L (ref 3.5–5.1)
Sodium: 135 mmol/L (ref 135–145)
TCO2: 31 mmol/L (ref 22–32)
pCO2 arterial: 47.4 mmHg (ref 32.0–48.0)
pH, Arterial: 7.401 (ref 7.350–7.450)
pO2, Arterial: 50 mmHg — ABNORMAL LOW (ref 83.0–108.0)

## 2019-09-21 LAB — SARS CORONAVIRUS 2 (TAT 6-24 HRS): SARS Coronavirus 2: POSITIVE — AB

## 2019-09-21 LAB — BRAIN NATRIURETIC PEPTIDE: B Natriuretic Peptide: 67.7 pg/mL (ref 0.0–100.0)

## 2019-09-21 MED ORDER — ALBUTEROL SULFATE HFA 108 (90 BASE) MCG/ACT IN AERS
3.0000 | INHALATION_SPRAY | Freq: Four times a day (QID) | RESPIRATORY_TRACT | Status: DC | PRN
  Administered 2019-09-22 – 2019-09-28 (×7): 3 via RESPIRATORY_TRACT
  Filled 2019-09-21: qty 6.7

## 2019-09-21 MED ORDER — IOHEXOL 350 MG/ML SOLN
75.0000 mL | Freq: Once | INTRAVENOUS | Status: AC | PRN
  Administered 2019-09-21: 22:00:00 75 mL via INTRAVENOUS

## 2019-09-21 MED ORDER — ACETAMINOPHEN 650 MG RE SUPP: 650.0000 mg | Freq: Four times a day (QID) | RECTAL | Status: DC | PRN

## 2019-09-21 MED ORDER — ALBUTEROL SULFATE (2.5 MG/3ML) 0.083% IN NEBU: 2.5000 mg | INHALATION_SOLUTION | Freq: Four times a day (QID) | RESPIRATORY_TRACT | Status: DC | PRN

## 2019-09-21 MED ORDER — ALBUTEROL SULFATE HFA 108 (90 BASE) MCG/ACT IN AERS
8.0000 | INHALATION_SPRAY | Freq: Once | RESPIRATORY_TRACT | Status: AC
  Administered 2019-09-21: 8 via RESPIRATORY_TRACT
  Filled 2019-09-21: qty 6.7

## 2019-09-21 MED ORDER — FUROSEMIDE 10 MG/ML IJ SOLN
40.0000 mg | Freq: Once | INTRAMUSCULAR | Status: AC
  Administered 2019-09-21: 40 mg via INTRAVENOUS
  Filled 2019-09-21: qty 4

## 2019-09-21 MED ORDER — ACETAMINOPHEN 325 MG PO TABS
650.0000 mg | ORAL_TABLET | Freq: Four times a day (QID) | ORAL | Status: DC | PRN
  Administered 2019-09-22 – 2019-09-25 (×2): 650 mg via ORAL
  Filled 2019-09-21 (×2): qty 2

## 2019-09-21 MED ORDER — UMECLIDINIUM-VILANTEROL 62.5-25 MCG/INH IN AEPB
1.0000 | INHALATION_SPRAY | Freq: Every day | RESPIRATORY_TRACT | Status: DC
  Administered 2019-09-22 – 2019-09-30 (×9): 1 via RESPIRATORY_TRACT
  Filled 2019-09-21 (×2): qty 14

## 2019-09-21 MED ORDER — ENOXAPARIN SODIUM 40 MG/0.4ML ~~LOC~~ SOLN
40.0000 mg | Freq: Every day | SUBCUTANEOUS | Status: DC
  Administered 2019-09-22 – 2019-09-30 (×9): 40 mg via SUBCUTANEOUS
  Filled 2019-09-21 (×10): qty 0.4

## 2019-09-21 NOTE — ED Notes (Signed)
Respiratory to patients bedside.

## 2019-09-21 NOTE — ED Provider Notes (Signed)
[facility] EMERGENCY DEPARTMENT Provider Note   CSN: [id number] Arrival date & time: 09/21/19  1446     History   Chief Complaint Chief Complaint  Patient presents with  . Shortness of Breath    HPI Aaron Daniels is a 78 y.o. male presenting for evaluation of shortness of breath.  Patient states over the past 4 days, he has had worsening shortness of breath.  Symptoms are worse with exertion.  Patient states he has had intermittent shortness of breath for the past several weeks to months.  He was recently admitted for similar.  He states he is taking his Lasix as prescribed, including the increased dose.  He continues to drink multiple 3 L bottles of liquids a day.  He states he had diarrhea several days ago, told me symptoms resolved however told my attending that he was still having diarrhea.  He denies sick contacts.  He denies contact with known COVID-19 positive person.  He denies fevers, chills, chest pain, nausea, vomiting, abdominal pain, urinary symptoms.  He states his leg swelling is improving on the increased dose of Lasix. History of COPD, on 3 to 3.5 L of oxygen at baseline.  Per son, patient was very short of breath and somnolent earlier today.  Symptoms improved when patient was placed on 5 L of oxygen.      HPI  Past Medical History:  Diagnosis Date  . COPD (chronic obstructive pulmonary disease) (HCC)   . Hypertension     Patient Active Problem List   Diagnosis Date Noted  . Hypoxia 09/21/2019  . COVID-19 09/21/2019  . Diarrhea 09/17/2019  . Snoring 09/15/2019  . COPD (chronic obstructive pulmonary disease) (HCC) 09/11/2019  . HTN (hypertension) 09/11/2019  . Heart failure with preserved ejection fraction (HCC) 09/07/2019  . COPD exacerbation (HCC) 09/07/2019  . Dyspnea 09/04/2019  . Chronic respiratory failure with hypoxia (HCC) 09/03/2019    No past surgical history on file.      Home Medications    Prior to Admission  medications   Medication Sig Start Date End Date Taking? Authorizing Provider  acetaminophen (TYLENOL) 325 MG tablet Take 650 mg by mouth every 6 (six) hours as needed for mild pain.   Yes [provider]  albuterol (PROVENTIL) (2.5 MG/3ML) 0.083% nebulizer solution Take 3 mLs (2.5 mg total) by nebulization every 6 (six) hours as needed for wheezing or shortness of breath. 09/15/19  Yes Glenford Bayley, NP  albuterol (VENTOLIN HFA) 108 (90 Base) MCG/ACT inhaler Inhale 3 puffs into the lungs every 6 (six) hours as needed for wheezing or shortness of breath.   Yes [provider]  furosemide (LASIX) 20 MG tablet Take 2 tablets (40 mg total) by mouth daily. 09/11/19  Yes Albertha Ghee, MD  lisinopril (ZESTRIL) 10 MG tablet Take 10 mg by mouth daily. 05/01/19  Yes [provider]  umeclidinium-vilanterol (ANORO ELLIPTA) 62.5-25 MCG/INH AEPB Inhale 1 puff into the lungs daily. 09/09/19  Yes Lanelle Bal, MD    Family History Family History  Problem Relation Age of Onset  . Hypertension Mother   . Diabetes Mother   . CAD Mother   . Colon cancer Father     Social History Social History   Tobacco Use  . Smoking status: Former Smoker    Packs/day: 1.25    Years: 56.00    Pack years: 70.00    Quit date: 07/13/2012    Years since quitting: 7.1  . Smokeless tobacco: Never  Used  Substance Use Topics  . Alcohol use: Never    Frequency: Never  . Drug use: Never     Allergies   Codeine, Penicillins, and Sulfa antibiotics   Review of Systems Review of Systems  Respiratory: Positive for shortness of breath.   Gastrointestinal: Positive for diarrhea.  All other systems reviewed and are negative.    Physical Exam Updated Vital Signs BP 104/73   Pulse (!) 105   Temp 98.3 F (36.8 C) (Oral)   Resp (!) 26   Ht 5\' 11"  (1.803 m)   Wt 101 kg   SpO2 91%   BMI 31.06 kg/m   Physical Exam Vitals signs and nursing note reviewed.  Constitutional:       Appearance: He is well-developed. He is ill-appearing.     Comments: Appears ill  HENT:     Head: Normocephalic and atraumatic.  Eyes:     Conjunctiva/sclera: Conjunctivae normal.     Pupils: Pupils are equal, round, and reactive to light.  Neck:     Musculoskeletal: Normal range of motion and neck supple.  Cardiovascular:     Rate and Rhythm: Regular rhythm. Tachycardia present.     Pulses: Normal pulses.     Comments: Tachycardic around 110 Pulmonary:     Effort: Tachypnea and accessory muscle usage present. No respiratory distress.     Breath sounds: Wheezing present.     Comments: Tachypneic in the mid 20s.  Satting 89 to 91% on 5 L via nasal cannula.  Scattered wheezing.  Slight increase accessory muscle use, but no respiratory failure Abdominal:     General: There is no distension.     Palpations: Abdomen is soft. There is no mass.     Tenderness: There is no abdominal tenderness. There is no guarding or rebound.  Musculoskeletal: Normal range of motion.     Right lower leg: Edema present.     Left lower leg: Edema present.     Comments: Bilateral 1+ pitting edema.  Skin:    General: Skin is warm and dry.     Capillary Refill: Capillary refill takes less than 2 seconds.  Neurological:     Mental Status: He is alert and oriented to person, place, and time.      ED Treatments / Results  Labs (all labs ordered are listed, but only abnormal results are displayed) Labs Reviewed  SARS CORONAVIRUS 2 (TAT 6-24 HRS) - Abnormal; Notable for the following components:      Result Value   SARS Coronavirus 2 POSITIVE (*)    All other components within normal limits  CBC WITH DIFFERENTIAL/PLATELET - Abnormal; Notable for the following components:   Hemoglobin 12.8 (*)    RDW 15.8 (*)    Lymphs Abs 0.6 (*)    All other components within normal limits  COMPREHENSIVE METABOLIC PANEL - Abnormal; Notable for the following components:   Chloride 97 (*)    Glucose, Bld 151 (*)     Creatinine, Ser 1.38 (*)    Calcium 8.0 (*)    Total Protein 5.6 (*)    Albumin 2.3 (*)    AST 78 (*)    ALT 61 (*)    GFR calc non Af Amer 48 (*)    GFR calc Af Amer 56 (*)    All other components within normal limits  POCT I-STAT 7, (LYTES, BLD GAS, ICA,H+H) - Abnormal; Notable for the following components:   pO2, Arterial 50.0 (*)    Bicarbonate  29.4 (*)    Acid-Base Excess 4.0 (*)    Calcium, Ion 1.13 (*)    HCT 35.0 (*)    Hemoglobin 11.9 (*)    All other components within normal limits  BRAIN NATRIURETIC PEPTIDE  CBC  BASIC METABOLIC PANEL  I-STAT ARTERIAL BLOOD GAS, ED    EKG EKG Interpretation  Date/Time:  Sunday September 21 2019 15:49:29 EDT Ventricular Rate:  107 PR Interval:    QRS Duration: 77 QT Interval:  316 QTC Calculation: 422 R Axis:   -62 Text Interpretation:  Sinus tachycardia Atrial premature complexes Abnormal R-wave progression, early transition Inferior infarct, old When compared with ECG of EARLIER SAME DATE No significant change was found Confirmed by Dione BoozeGlick, David ([id number]) on 09/21/2019 11:43:16 PM   Radiology Ct Angio Chest Pe W/cm &/or Wo Cm  Result Date: 09/21/2019 CLINICAL DATA:  Shortness of breath, PE suspected, high pretest probability EXAM: CT ANGIOGRAPHY CHEST WITH CONTRAST TECHNIQUE: Multidetector CT imaging of the chest was performed using the standard protocol during bolus administration of intravenous contrast. Multiplanar CT image reconstructions and MIPs were obtained to evaluate the vascular anatomy. CONTRAST:  75mL OMNIPAQUE IOHEXOL 350 MG/ML SOLN COMPARISON:  Most recent CTA chest 09/05/2019 FINDINGS: Cardiovascular: Suboptimal opacification of the pulmonary arteries. Contrast opacifies to the level of the proximal segmental arteries with more distal evaluation limited by bolus and respiratory motion. No central, lobar or proximal segmental pulmonary arterial filling defects are seen. Central pulmonary arteries are normal caliber.  Atherosclerotic plaque within the normal caliber aorta. Shared origin of the brachiocephalic and left common carotid. Atheromatous plaque within the proximal great vessels. Normal heart size. Small volume pericardial fluid is likely within physiologic normal. Aortic leaflet calcifications are present. Coronary artery calcifications are present as well. Mediastinum/Nodes: Multiple reactive appearing though nonenlarged lymph nodes are present in the mediastinum and bilateral hila. Thyroid gland and thoracic inlet are unremarkable. Bowing of the posterior trachea reflects imaging during exhalation. No acute tracheal abnormality. The esophagus is unremarkable. Lungs/Pleura: There are patchy multifocal areas of mixed reticular interstitial and ground-glass opacity most pronounced in the periphery of the posterior segments of both upper lobes, within the lingula and right lung base. Small bilateral pleural effusions are present as well. No pneumothorax. Upper Abdomen: Nodular thickening of the adrenal glands is similar to comparison studies No acute abnormalities present in the visualized portions of the upper abdomen. Musculoskeletal: Multilevel degenerative changes are present in the imaged portions of the spine. More severe changes are noted in the lower cervical spine, incompletely evaluated on this CT exam. Mild left and severe right osteoarthrosis of the shoulders. Review of the MIP images confirms the above findings. IMPRESSION: 1. Suboptimal opacification of the pulmonary arteries but without visible central, lobar or proximal segmental pulmonary arterial filling defects. 2. Patchy multifocal areas of mixed reticular interstitial and ground-glass opacity most pronounced in the periphery of the posterior segments of both upper lobes, within the lingula and right lung base, concerning for a possible acute infectious or inflammatory process. Reactive adenopathy present in the mediastinum and hila. 3. Small bilateral  pleural effusions. 4. Nodular thickening of the adrenal glands likely reflecting senescent adrenal hyperplasia. 5. Aortic Atherosclerosis (ICD10-I70.0). Electronically Signed   By: Kreg ShropshirePrice  DeHay M.D.   On: 09/21/2019 22:32   Dg Chest Portable 1 View  Result Date: 09/21/2019 CLINICAL DATA:  Shortness of breath. EXAM: PORTABLE CHEST 1 VIEW COMPARISON:  September 06, 2019. FINDINGS: The heart size and mediastinal contours are within normal limits.  No pneumothorax or pleural effusion is noted. Increased bibasilar atelectasis or edema is noted. No pneumothorax or pleural effusion is noted. The visualized skeletal structures are unremarkable. IMPRESSION: Increased bibasilar atelectasis or edema is noted. Electronically Signed   By: Marijo Conception M.D.   On: 09/21/2019 16:48    Procedures .Critical Care Performed by: Franchot Heidelberg, PA-C Authorized by: Franchot Heidelberg, PA-C   Critical care provider statement:    Critical care time (minutes):  45   Critical care time was exclusive of:  Separately billable procedures and treating other patients and teaching time   Critical care was necessary to treat or prevent imminent or life-threatening deterioration of the following conditions:  Respiratory failure   Critical care was time spent personally by me on the following activities:  Blood draw for specimens, development of treatment plan with patient or surrogate, evaluation of patient's response to treatment, examination of patient, obtaining history from patient or surrogate, ordering and performing treatments and interventions, ordering and review of laboratory studies, ordering and review of radiographic studies, pulse oximetry, re-evaluation of patient's condition and review of old charts Comments:     Pt with worsening respiratory status requiring ventimask and frequent reassessment.    (including critical care time)  Medications Ordered in ED Medications  albuterol (VENTOLIN HFA) 108 (90  Base) MCG/ACT inhaler 3 puff (has no administration in time range)  umeclidinium-vilanterol (ANORO ELLIPTA) 62.5-25 MCG/INH 1 puff (has no administration in time range)  enoxaparin (LOVENOX) injection 40 mg (has no administration in time range)  acetaminophen (TYLENOL) tablet 650 mg (has no administration in time range)    Or  acetaminophen (TYLENOL) suppository 650 mg (has no administration in time range)  furosemide (LASIX) injection 40 mg (40 mg Intravenous Given 09/21/19 1654)  albuterol (VENTOLIN HFA) 108 (90 Base) MCG/ACT inhaler 8 puff (8 puffs Inhalation Given 09/21/19 1656)  iohexol (OMNIPAQUE) 350 MG/ML injection 75 mL (75 mLs Intravenous Contrast Given 09/21/19 2208)     Initial Impression / Assessment and Plan / ED Course  I have reviewed the triage vital signs and the nursing notes.  Pertinent labs & imaging results that were available during my care of the patient were reviewed by me and considered in my medical decision making (see chart for details).        Pt presenting for evaluation of SOB.  Physical exam shows patient who has slight increased respiratory effort and tachypnea.  Scattered wheezes.  Sats low despite higher level of oxygen.  I am concerned for COPD versus CHF.  Consider PE.  Consider viral illness such as flu or COVID.  Informed by RN that SpO2 decreasing to mid 80s on 5 L. Will give lasix and albuterol.   On reassessment, patient remains satting in the upper 80s/low 90s.  No increased work of breathing.  Creatinine slightly up from baseline at 1.3, baseline 0.9.  Otherwise labs are reassuring.  BNP normal at 67.  Chest x-ray viewed interpreted by me no obvious pneumonia.  Per radiology read, bibasilar edema versus atelectasis.  Will order PE study, as patient is tachycardic, hypoxic, and blood pressure soft in the 90s, however no obvious risk factors as this time.  ABG ordered.  ABG shows PO2 of 50. Will place pt on ventimask. Case discussed with  attending, Dr. Alvino Chapel evaluated the pt. Will admit to internal medicine.   Discussed with internal medicine resident service, patient to be admitted.  Informed by IMTS that patient is covered positive, will need to  be transferred to [facility], IMTS to reach out to hospitalist for admission and transfer.    Final Clinical Impressions(s) / ED Diagnoses   Final diagnoses:  COVID-19    ED Discharge Orders    None       Alveria Apley, PA-C 09/21/19 2350    Benjiman Core, MD 09/24/19 [phone]

## 2019-09-21 NOTE — ED Notes (Signed)
Patient having increasing oxygen needs. Not able to get Orthostatic vitals at this time.

## 2019-09-21 NOTE — ED Notes (Addendum)
Rounding physicians, [place] and resident aware of patients current status. Goal oxygen saturations will be 88%-92% at this time. Physicians spoke to family as well.

## 2019-09-21 NOTE — ED Notes (Signed)
Updated PA, [place] of patient oxygen saturations being 86% on 6L. Increasing o2 needs. Medication order placed.

## 2019-09-21 NOTE — H&P (Addendum)
Date: 09/21/2019               Patient Name:  Aaron PiesWilliam Daniels MRN: [id number]  DOB: 10/09/1941 Age / Sex: 78 y.o., male   PCP: Albertha GheeSteen, James, MD         Medical Service: Internal Medicine Teaching Service         Attending Physician: Dr. Reymundo PollGuilloud, Carolyn, MD    First Contact: Marchia Bondoe, DO, Sharlet SalinaBenjamin Pager: [facility] [phone])  Second Contact: Cleaster CorinSeawell, DO, Jaimie PagerDuane Lope: Jsea [phone])       After Hours (After 5p/  First Contact Pager: [phone]  weekends / holidays): Second Contact Pager: [phone]   Chief Complaint: lethargy  History of Present Illness: 78 y.o. yo male w/ PMH significant for HTN, BPH s/p TURP, COPD on 3-3.5 L O2 at home, HFpEF with recent admission for acute hypoxic respiratory failure discharged on 9/28 presenting with shortness of breath and lethargy for past four days that has progressively worsened. History is obtained primarily from the grandson at bedside.  Over the past several days, patient has been increasingly lethargic and has had loose stools with 2-3 bowel movements per day. This morning, grandson found the patient lying supine with increased work of breathing and clutching his heart. The grandson reports that he had a paradoxical breathing pattern even with his oxygen on. The grandson increased O2 to 5L; however, he reports that the patient still appeared to be "dosing off" multiple times throughout the day. Per grandson, patient has had 2-3 loose stools per day for the past week with concerns of fecal incontinence today. His stool consistent is based on what he consumes and sometimes, looks like vegetable soup. He has had decreased PO intake for the past few days as well. However, was able to eat a [employer] ham and cheese biscuit earlier today. He has had some decreased urination as well.  Patient denies any fevers, chills, headache, dizziness, chest pain, palpitations, abdominal pain, nausea/vomiting or focal weakness.  Of note, patient was evaluated in the clinic  for his HFpEF and his Lasix was increased to 40mg  qd for hypervolemia. Today, his weight is similar to weight at discharge.   ED course:  Patient arrived to ED with increased shortness of breath and increased lethargy, requiring increased oxygen requirements. Patient found to be hypoxic, requiring up to 10L O2 on Venturi mask with 88-91% SpO2. Patient given one dose Lasix and 8 puff albuterol. He is admitted to inpatient for further management of patient.    Meds:  Current Meds  Medication Sig  . acetaminophen (TYLENOL) 325 MG tablet Take 650 mg by mouth every 6 (six) hours as needed for mild pain.  Marland Kitchen. albuterol (PROVENTIL) (2.5 MG/3ML) 0.083% nebulizer solution Take 3 mLs (2.5 mg total) by nebulization every 6 (six) hours as needed for wheezing or shortness of breath.  Marland Kitchen. albuterol (VENTOLIN HFA) 108 (90 Base) MCG/ACT inhaler Inhale 3 puffs into the lungs every 6 (six) hours as needed for wheezing or shortness of breath.  . furosemide (LASIX) 20 MG tablet Take 2 tablets (40 mg total) by mouth daily.  Marland Kitchen. lisinopril (ZESTRIL) 10 MG tablet Take 10 mg by mouth daily.  Marland Kitchen. umeclidinium-vilanterol (ANORO ELLIPTA) 62.5-25 MCG/INH AEPB Inhale 1 puff into the lungs daily.     Allergies: Allergies as of 09/21/2019 - Review Complete 09/21/2019  Allergen Reaction Noted  . Codeine  07/03/2019  . Penicillins Itching 07/03/2019  . Sulfa antibiotics Rash 07/03/2019   Past Medical History:  Diagnosis  Date  . COPD (chronic obstructive pulmonary disease) (HCC)   . Hypertension     Family History:  Family History  Problem Relation Age of Onset  . Hypertension Mother   . Diabetes Mother   . CAD Mother   . Colon cancer Father      Social History:  Social History   Tobacco Use  . Smoking status: Former Smoker    Quit date: 07/13/2012    Years since quitting: 7.1  . Smokeless tobacco: Never Used  Substance Use Topics  . Alcohol use: Never    Frequency: Never  . Drug use: Never     Review of  Systems: A complete ROS was negative except as per HPI.   Physical Exam: Blood pressure 99/80, pulse (!) 112, temperature 98.3 F (36.8 C), temperature source Oral, resp. rate (!) 21, height 5\' 11"  (1.803 m), weight 101 kg, SpO2 94 %. Physical Exam Constitutional:      General: He is not in acute distress.    Appearance: He is obese. He is ill-appearing. He is not diaphoretic.  Eyes:     Extraocular Movements: Extraocular movements intact.  Cardiovascular:     Rate and Rhythm: Regular rhythm. Tachycardia present.     Pulses: Normal pulses.  Pulmonary:     Effort: Pulmonary effort is normal. No accessory muscle usage.     Breath sounds: No stridor. Examination of the right-upper field reveals wheezing. Examination of the left-upper field reveals wheezing. Examination of the right-middle field reveals rales. Examination of the left-middle field reveals rales. Examination of the right-lower field reveals decreased breath sounds. Examination of the left-lower field reveals decreased breath sounds. Decreased breath sounds, wheezing and rales present.  Chest:     Chest wall: No tenderness or crepitus.  Abdominal:     General: Bowel sounds are normal.     Palpations: Abdomen is soft.     Tenderness: There is no abdominal tenderness. There is no guarding or rebound.  Musculoskeletal: Normal range of motion.     Right lower leg: No edema.     Left lower leg: No edema.  Skin:    General: Skin is warm and dry.     Capillary Refill: Capillary refill takes less than 2 seconds.  Neurological:     General: No focal deficit present.     Mental Status: He is alert and oriented to person, place, and time.    EKG: personally reviewed my interpretation is sinus tachycardia 110bpm  CXR:  IMPRESSION: Bibasilar atelectasis noted with blunting of bilateral costophrenic angles. Pulmonary edema.    Assessment & Plan by Problem: Active Problems:   Hypoxia  Patient is 78yo male with Hx of HTN,  COPD, and HFpEF with recent admission of acute on chronic hypoxic respiratory failure presenting with progressively worsening lethargy and dyspnea with increased O2 requirements.   Acute on chronic hypoxic respiratory failure:  Patient presented with four days of progressively worsening lethargy and dyspnea with increasing O2 requirement. He has a recent admission for acute on chronic hypoxic respiratory failure presumed to be secondary to COPD exacerbation vs. HFpEF and has been on 3L O2 at home. Since his discharge, patient has been lethargic. Patient with increasing O2 requirements for one day duration and continues to be hypoxic with SpO2 84-88% on 10L O2 with venturi mask during examination. Patient is afebrile but tachycardic. He does not appear to be in acute distress and does not have increased work of breathing. On lung auscultation, diminished breath  sounds at bilateral bases, crackles in bilateral mid lungs and wheezing at apices. Trace edema in right lower extremity.  No significant JVD or S3 noted on examination. CXR significant for bibasilar atelectasis with blunting of bilateral costophrenic angles with pulmonary edema. No leukocytosis on labs. ABG without acidosis or hypercarbia.  Patient's symptoms could be secondary to heart failure exacerbation given crackles on examination and trace pitting edema. However, BNP is 67, which is not significantly elevated. Patient's weight is similar to that at discharge. Patient given one dose of Lasix in the ED.  As patient does not have fever, productive cough, or leukocytosis COPD exacerbation is less likely to be the cause of his symptoms.  Patient found to be COVID positive.  - Transfer to [facility] for further management  - Respiratory support PRN (No BiPAP/CPAP)  - Albuterol nebulizer q6h prn  - Anoro Ellipta 1 puff qd   AKI: BUN/Cr 19/1.38 today (26/1.15 on discharge). Likely pre-renal in setting of decreased PO intake and hypotension.  -  IVF   GOC: Patient is DNR/DNI. Extensive GOC discussion with patient and grandson at bedside. Discussed with patient that due to continued hypoxia in setting of increased oxygen demand, patient will likely need BiPAP/CPAP. He refused this treatment. Patient was made aware that if he does not get his BIPAP/CPAP and continues to have the increased oxygen demand, he will most likely pass away. Patient is alert and oriented and has full capacity to make decisions. He understands that this is the projected course. He also refuses any comfort measures if he has increased shortness of breath and experiences air hunger during process of passing.   Diet: NPO Code: DNR/DNI VTE Prophylaxis: Lovenox  Dispo: Admit patient to Inpatient with expected length of stay greater than 2 midnights.  SignedHarvie Heck, MD 09/21/2019, 8:29 PM  Pager: [phone]

## 2019-09-21 NOTE — ED Notes (Signed)
Respiratory at bedside.

## 2019-09-21 NOTE — ED Triage Notes (Addendum)
Patient arrives via POV due to having increased shortness of breath and decreased ability to do daily activities (e.g., make it to the restroom). Patient reports he wears 3.5L of O2 at home.   Currently 93% on 4L. Patient reports NO chest pain. Dyspnea at rest and with exertion.  Hx of COPD

## 2019-09-22 DIAGNOSIS — R0902 Hypoxemia: Secondary | ICD-10-CM

## 2019-09-22 DIAGNOSIS — U071 COVID-19: Principal | ICD-10-CM

## 2019-09-22 LAB — COMPREHENSIVE METABOLIC PANEL
ALT: 51 U/L — ABNORMAL HIGH (ref 0–44)
AST: 55 U/L — ABNORMAL HIGH (ref 15–41)
Albumin: 2.4 g/dL — ABNORMAL LOW (ref 3.5–5.0)
Alkaline Phosphatase: 44 U/L (ref 38–126)
Anion gap: 9 (ref 5–15)
BUN: 22 mg/dL (ref 8–23)
CO2: 30 mmol/L (ref 22–32)
Calcium: 7.9 mg/dL — ABNORMAL LOW (ref 8.9–10.3)
Chloride: 98 mmol/L (ref 98–111)
Creatinine, Ser: 1.42 mg/dL — ABNORMAL HIGH (ref 0.61–1.24)
GFR calc Af Amer: 54 mL/min — ABNORMAL LOW (ref >60)
GFR calc non Af Amer: 46 mL/min — ABNORMAL LOW (ref >60)
Glucose, Bld: 129 mg/dL — ABNORMAL HIGH (ref 70–99)
Potassium: 4.5 mmol/L (ref 3.5–5.1)
Sodium: 137 mmol/L (ref 135–145)
Total Bilirubin: 0.8 mg/dL (ref 0.3–1.2)
Total Protein: 5.6 g/dL — ABNORMAL LOW (ref 6.5–8.1)

## 2019-09-22 LAB — CBC
HCT: 39.2 % (ref 39.0–52.0)
Hemoglobin: 11.9 g/dL — ABNORMAL LOW (ref 13.0–17.0)
MCH: 28.3 pg (ref 26.0–34.0)
MCHC: 30.4 g/dL (ref 30.0–36.0)
MCV: 93.1 fL (ref 80.0–100.0)
Platelets: 172 10*3/uL (ref 150–400)
RBC: 4.21 MIL/uL — ABNORMAL LOW (ref 4.22–5.81)
RDW: 16.1 % — ABNORMAL HIGH (ref 11.5–15.5)
WBC: 5.9 10*3/uL (ref 4.0–10.5)
nRBC: 0 % (ref 0.0–0.2)

## 2019-09-22 LAB — CBC WITH DIFFERENTIAL/PLATELET
Abs Immature Granulocytes: 0.07 10*3/uL (ref 0.00–0.07)
Basophils Absolute: 0 10*3/uL (ref 0.0–0.1)
Basophils Relative: 0 %
Eosinophils Absolute: 0 10*3/uL (ref 0.0–0.5)
Eosinophils Relative: 0 %
HCT: 38.2 % — ABNORMAL LOW (ref 39.0–52.0)
Hemoglobin: 11.7 g/dL — ABNORMAL LOW (ref 13.0–17.0)
Immature Granulocytes: 1 %
Lymphocytes Relative: 7 %
Lymphs Abs: 0.5 10*3/uL — ABNORMAL LOW (ref 0.7–4.0)
MCH: 28.6 pg (ref 26.0–34.0)
MCHC: 30.6 g/dL (ref 30.0–36.0)
MCV: 93.4 fL (ref 80.0–100.0)
Monocytes Absolute: 0.2 10*3/uL (ref 0.1–1.0)
Monocytes Relative: 3 %
Neutro Abs: 6.3 10*3/uL (ref 1.7–7.7)
Neutrophils Relative %: 89 %
Platelets: 183 10*3/uL (ref 150–400)
RBC: 4.09 MIL/uL — ABNORMAL LOW (ref 4.22–5.81)
RDW: 16.1 % — ABNORMAL HIGH (ref 11.5–15.5)
WBC: 7.2 10*3/uL (ref 4.0–10.5)
nRBC: 0 % (ref 0.0–0.2)

## 2019-09-22 LAB — BASIC METABOLIC PANEL
Anion gap: 10 (ref 5–15)
BUN: 18 mg/dL (ref 8–23)
CO2: 29 mmol/L (ref 22–32)
Calcium: 7.7 mg/dL — ABNORMAL LOW (ref 8.9–10.3)
Chloride: 98 mmol/L (ref 98–111)
Creatinine, Ser: 1.21 mg/dL (ref 0.61–1.24)
GFR calc Af Amer: >60 mL/min (ref >60)
GFR calc non Af Amer: 56 mL/min — ABNORMAL LOW (ref >60)
Glucose, Bld: 121 mg/dL — ABNORMAL HIGH (ref 70–99)
Potassium: 4.3 mmol/L (ref 3.5–5.1)
Sodium: 137 mmol/L (ref 135–145)

## 2019-09-22 LAB — FERRITIN: Ferritin: 995 ng/mL — ABNORMAL HIGH (ref 24–336)

## 2019-09-22 LAB — ABO/RH: ABO/RH(D): A POS

## 2019-09-22 LAB — C-REACTIVE PROTEIN: CRP: 19.8 mg/dL — ABNORMAL HIGH (ref <1.0)

## 2019-09-22 LAB — PROCALCITONIN: Procalcitonin: 0.14 ng/mL

## 2019-09-22 MED ORDER — SODIUM CHLORIDE 0.9% IV SOLUTION: Freq: Once | INTRAVENOUS | Status: DC

## 2019-09-22 MED ORDER — VITAMIN C 500 MG PO TABS
500.0000 mg | ORAL_TABLET | Freq: Every day | ORAL | Status: DC
  Administered 2019-09-22 – 2019-09-30 (×9): 500 mg via ORAL
  Filled 2019-09-22 (×9): qty 1

## 2019-09-22 MED ORDER — MORPHINE SULFATE (PF) 2 MG/ML IV SOLN
2.0000 mg | INTRAVENOUS | Status: DC | PRN
  Administered 2019-09-22: 2 mg via INTRAVENOUS
  Filled 2019-09-22: qty 1

## 2019-09-22 MED ORDER — ZINC SULFATE 220 (50 ZN) MG PO CAPS
220.0000 mg | ORAL_CAPSULE | Freq: Every day | ORAL | Status: DC
  Administered 2019-09-22 – 2019-09-30 (×9): 220 mg via ORAL
  Filled 2019-09-22 (×9): qty 1

## 2019-09-22 MED ORDER — HYDROCOD POLST-CPM POLST ER 10-8 MG/5ML PO SUER: 5.0000 mL | Freq: Two times a day (BID) | ORAL | Status: DC | PRN

## 2019-09-22 MED ORDER — SODIUM CHLORIDE 0.9 % IV SOLN
100.0000 mg | INTRAVENOUS | Status: AC
  Administered 2019-09-23 – 2019-09-26 (×4): 100 mg via INTRAVENOUS
  Filled 2019-09-22 (×4): qty 20

## 2019-09-22 MED ORDER — FUROSEMIDE 10 MG/ML IJ SOLN
40.0000 mg | Freq: Two times a day (BID) | INTRAMUSCULAR | Status: DC
  Administered 2019-09-22: 09:00:00 40 mg via INTRAVENOUS
  Filled 2019-09-22: qty 4

## 2019-09-22 MED ORDER — SODIUM CHLORIDE 0.9 % IV SOLN
200.0000 mg | Freq: Once | INTRAVENOUS | Status: AC
  Administered 2019-09-22: 200 mg via INTRAVENOUS
  Filled 2019-09-22: qty 40

## 2019-09-22 MED ORDER — DEXAMETHASONE SODIUM PHOSPHATE 10 MG/ML IJ SOLN
6.0000 mg | INTRAMUSCULAR | Status: DC
  Administered 2019-09-22 – 2019-09-26 (×5): 6 mg via INTRAVENOUS
  Administered 2019-09-27: 0.6 mg via INTRAVENOUS
  Administered 2019-09-28 – 2019-09-30 (×3): 6 mg via INTRAVENOUS
  Filled 2019-09-22 (×9): qty 1

## 2019-09-22 MED ORDER — GUAIFENESIN-DM 100-10 MG/5ML PO SYRP
10.0000 mL | ORAL_SOLUTION | ORAL | Status: DC | PRN
  Administered 2019-09-25: 21:00:00 10 mL via ORAL
  Filled 2019-09-22: qty 10

## 2019-09-22 NOTE — Progress Notes (Signed)
RR RN called to evaluate patient due to hypotension. Upon arrival patient's BP low 80's over 50's with MAP greater than 60. Patient alert and oriented, able to converse. Dr. Thereasa Solo notified and came to bedside to see patient. Patient recently received a dose of Morphine. MD is ok with BP's as long as MAP is greater than 60 and patient is communicating properly. Patient's RN aware and present when MD at bedside.

## 2019-09-22 NOTE — ED Notes (Signed)
Report given to Anda Kraft, RN at [employer].

## 2019-09-22 NOTE — Progress Notes (Signed)
   Went back in the unit to visit patient.  He is awake alert and conversant.  He is oriented to person and time and place.  He is still requiring high level oxygen support.  I spoke with him at length about Actemra as well as convalescent plasma as our 2 remaining treatment options to consider.  We spoke at length about Actemra and the fact that it is not currently FDA approved for use against Covid.  We discussed the potential side effects associated with his use and the fact that at this time it is still unclear if it is helpful in this illness and it which days of the illness it is most helpful.  The patient opted not to try Actemra.  We then spoke a great length about the use of convalescent plasma.  The patient showed initial interest and therefore I presented him with a printed copy of the consent form and then proceeded to read the consent form to him giving him an opportunity to ask questions with the section.  Patient voiced understanding of the consent form.  We had further dialogue considering the potential mechanism of action.  We discussed the potential side effects in greater detail as well.  The patient decided he was willing to consent to use of convalescent plasma.  Orders have been entered to initiate a transfusion.  Cherene Altes, MD [facility] Office  [phone]  09/22/2019, 5:16 PM

## 2019-09-22 NOTE — Progress Notes (Signed)
Aaron Daniels  UJW:[id number] DOB: 1941-01-29 DOA: 09/21/2019 PCP: [name], MD    Brief Narrative:  78 year old with a history of HTN, BPH status post TURP, COPD requiring 3 L home O2, and diastolic CHF who was discharged 9/28 after admission for hypoxic respiratory failure.  He returned to the ED with 2 to 3 days of progressively worsening lethargy with loose stools, and severe shortness of breath.  The patient's grandson found him with obvious shortness of breath clutching his chest and very somnolent.  In the [facility] ED he displayed increased work of breathing and was hypoxic requiring 10 L via Venturi mask to keep sats at 88 or better.  He was admitted initially with a diagnosis of an acute CHF exacerbation.  He was later found to be Covid positive.  Significant Events: 9/28 D/C home (non-COVID admit to [facility]) 9/23 SARS-CoV-2 negative 9/24-9/28 admit to [facility] for acute hypoxic respiratory failure -?CHF and COP exac 10/11 admit to [facility] on transfer from [place]   COVID-19 specific Treatment: Decadron 10/12 > Remdesivir 10/12 >  Subjective: Was in significant respiratory distress upon arrival at [facility]. This was corrected by his RN rapidly. Some hypotension has been encountered since admit as well. At time of my eval, the pt is alert, conversant, and calm. He tells me he feels much better. He denies cp, n/v, abdom pain.   Assessment & Plan:  Covid pneumonia - acute on chronic hypoxic respiratory failure CT angio chest negative for PE but remarkable for severe diffuse pulmonary infiltrates - pt requiring high level O2 support - high risk for rapid decline - given elevated CRP will add actemra to tx regimen if he does not respond to steroid and decadron   Recent Labs  Lab 09/21/19 1543 09/22/19 1030  FERRITIN  --  995*  CRP  --  19.8*  ALT 61* 51*  PROCALCITON  --  0.14    Covid gastroenteritis Approximately 7 days of frequent diarrhea at time of admission - follow  lytes - symptomatic control   Diastolic CHF  EF 56-21% per TTE - baseline weight appears to be 100.2 kg - does not appear grossly overloaded on exam - hold diuretic in setting of hypotension   Filed Weights   09/21/19 1522  Weight: 101 kg   Hypotension Most c/w dehydration due to poor intake and increased GI losses   COPD on chronic home oxygen No wheezing at this time   Mild transaminitis Likely low-grade shock liver -follow trend  Acute kidney injury Likely due to ATN in setting of hypotension - follow trend   Recent Labs  Lab 09/21/19 1543 09/22/19 0403 09/22/19 1030  CREATININE 1.38* 1.21 1.42*    Iron deficiency anemia Hemoglobin 12.3 at time of previous admission - no evidence of acute blood loss   DM2 Is not on home medications -A1c in September 6.5 -follow CBGs  HTN Not an active issue at this time   Suspicion for OSA Heavy snoring - evaluated in PCCM clinic 10/5  DVT prophylaxis: lovenox  Code Status: DNR - NO CODE  Family Communication:  Disposition Plan: PCU - poor prognosis   Consultants:  none  Antimicrobials:  None   Objective: Blood pressure (!) 92/58, pulse (!) 103, temperature 98.8 F (37.1 C), temperature source Oral, resp. rate (!) 24, height 5\' 11"  (1.803 m), weight 101 kg, SpO2 93 %.  Intake/Output Summary (Last 24 hours) at 09/22/2019 1619 Last data filed at 09/22/2019 1600 Gross per 24  hour  Intake 610 ml  Output 600 ml  Net 10 ml   Filed Weights   09/21/19 1522  Weight: 101 kg    Examination: General: No acute respiratory distress at time of my evaluation Lungs: Fine diffuse crackles Cardiovascular: RRR without murmur Abdomen: Nontender, nondistended, soft, bowel sounds positive, no rebound, no ascites, no appreciable mass Extremities: No significant cyanosis, clubbing, or edema bilateral lower extremities  CBC: Recent Labs  Lab 09/21/19 1543 09/21/19 1736 09/22/19 0403 09/22/19 1030  WBC 6.8  --  5.9 7.2   NEUTROABS 5.9  --   --  6.3  HGB 12.8* 11.9* 11.9* 11.7*  HCT 41.3 35.0* 39.2 38.2*  MCV 93.4  --  93.1 93.4  PLT 180  --  172 183   Basic Metabolic Panel: Recent Labs  Lab 09/21/19 1543 09/21/19 1736 09/22/19 0403 09/22/19 1030  NA 135 135 137 137  K 4.3 3.9 4.3 4.5  CL 97*  --  98 98  CO2 27  --  29 30  GLUCOSE 151*  --  121* 129*  BUN 19  --  18 22  CREATININE 1.38*  --  1.21 1.42*  CALCIUM 8.0*  --  7.7* 7.9*   GFR: Estimated Creatinine Clearance: 50.2 mL/min (A) (by C-G formula based on SCr of 1.42 mg/dL (H)).  Liver Function Tests: Recent Labs  Lab 09/21/19 1543 09/22/19 1030  AST 78* 55*  ALT 61* 51*  ALKPHOS 48 44  BILITOT 0.3 0.8  PROT 5.6* 5.6*  ALBUMIN 2.3* 2.4*    HbA1C: Hgb A1c MFr Bld  Date/Time Value Ref Range Status  09/04/2019 06:12 PM 6.5 (H) 4.8 - 5.6 % Final    Comment:    (NOTE) Pre diabetes:          5.7%-6.4% Diabetes:              >6.4% Glycemic control for   <7.0% adults with diabetes     CBG: No results for input(s): GLUCAP in the last 168 hours.  Recent Results (from the past 240 hour(s))  SARS CORONAVIRUS 2 (TAT 6-24 HRS) Nasopharyngeal Nasopharyngeal Swab     Status: Abnormal   Collection Time: 09/21/19  3:43 PM   Specimen: Nasopharyngeal Swab  Result Value Ref Range Status   SARS Coronavirus 2 POSITIVE (A) NEGATIVE Final    Comment: RESULT CALLED TO, READ BACK BY AND VERIFIED WITH: S. Ronny FlurryWILSON,RN 2147 09/21/2019 T. TYSOR (NOTE) SARS-CoV-2 target nucleic acids are DETECTED. The SARS-CoV-2 RNA is generally detectable in upper and lower respiratory specimens during the acute phase of infection. Positive results are indicative of active infection with SARS-CoV-2. Clinical  correlation with patient history and other diagnostic information is necessary to determine patient infection status. Positive results do  not rule out bacterial infection or co-infection with other viruses. The expected result is Negative. Fact Sheet  for Patients: HairSlick.nohttps://www.fda.gov/media/138098/download Fact Sheet for Healthcare Providers: quierodirigir.comhttps://www.fda.gov/media/138095/download This test is not yet approved or cleared by the [place] FDA and  has been authorized for detection and/or diagnosis of SARS-CoV-2 by FDA under an Emergency Use Authorization (EUA). This EUA will remain  in effect (meaning this test can be used) for  the duration of the COVID-19 declaration under Section 564(b)(1) of the Act, 21 U.S.C. section 360bbb-3(b)(1), unless the authorization is terminated or revoked sooner. Performed at [facility], [address] [place], [place] [address]   [place]  Scheduled Meds: . dexamethasone (DECADRON) injection  6 mg Intravenous Q24H  .  enoxaparin (LOVENOX) injection  40 mg Subcutaneous Daily  . umeclidinium-vilanterol  1 puff Inhalation Daily  . vitamin C  500 mg Oral Daily  . zinc sulfate  220 mg Oral Daily   Continuous Infusions: . [START ON 09/23/2019] remdesivir 100 mg in NS 250 mL       LOS: 1 day   Lonia Blood, MD [facility] Office  [phone] Pager - Text Page per Amion  If 7PM-7AM, please contact night-coverage per Amion 09/22/2019, 4:19 PM

## 2019-09-22 NOTE — Progress Notes (Signed)
RR RN called down to see patient due to desat. Patient on 8L 40% Venturi Mask upon arrival O2 sats 84-86%. Placed patient on a NRB at 15L and O2 sats increased to 92-94%. Charge RN on PCU aware. Patient in no distress at this time.

## 2019-09-22 NOTE — Progress Notes (Signed)
Family Update:  Primary RN spoke with patient son Jaquelyn Bitter concerning patient status and POC.

## 2019-09-22 NOTE — ED Notes (Signed)
[facility] Called 

## 2019-09-22 NOTE — Progress Notes (Signed)
Hypoxia and Hypotension  Primary RN consulted with Rapid Response Nurse and Dr. Thereasa Solo concerning patient status. Patient on Ventri Mask 8L at 45% FIO2 at beginning of shift, patient very tachypenic and hypoxia. Previous RN Mateo Flow) stated in report that patient 02 goal is 92%, patient was satting at 86% with respiratory rate 30-34. MEW Red and MD notified. MD ordered Remdesivir,Decadron, Morphine and Lasix with Vit C/Zinc. Patient received Morphine for air hunger along with the Lasix and his respiratory status improved, 02 sats 92-94% on 100% NRB. Primary RN bladder scanned patient due to urinary retention, 515ml in bladder MD aware, 16Fr foley placed per unit protocol. Rapid Response RN/MD called to bedside for hypotension. Patient SBP 70-80s with map 62-65. MD stated as long as patient is perfusing with MAP above 60 then he is not concerning about the hypotension. Patient placed on q1H vitals and RN will continue to monitor, document and report assessment changes to MD.

## 2019-09-23 LAB — COMPREHENSIVE METABOLIC PANEL
ALT: 44 U/L (ref 0–44)
AST: 39 U/L (ref 15–41)
Albumin: 2.5 g/dL — ABNORMAL LOW (ref 3.5–5.0)
Alkaline Phosphatase: 47 U/L (ref 38–126)
Anion gap: 12 (ref 5–15)
BUN: 43 mg/dL — ABNORMAL HIGH (ref 8–23)
CO2: 29 mmol/L (ref 22–32)
Calcium: 8.4 mg/dL — ABNORMAL LOW (ref 8.9–10.3)
Chloride: 99 mmol/L (ref 98–111)
Creatinine, Ser: 1.24 mg/dL (ref 0.61–1.24)
GFR calc Af Amer: >60 mL/min (ref >60)
GFR calc non Af Amer: 55 mL/min — ABNORMAL LOW (ref >60)
Glucose, Bld: 194 mg/dL — ABNORMAL HIGH (ref 70–99)
Potassium: 4.7 mmol/L (ref 3.5–5.1)
Sodium: 140 mmol/L (ref 135–145)
Total Bilirubin: 0.4 mg/dL (ref 0.3–1.2)
Total Protein: 5.7 g/dL — ABNORMAL LOW (ref 6.5–8.1)

## 2019-09-23 LAB — CBC WITH DIFFERENTIAL/PLATELET
Abs Immature Granulocytes: 0.06 10*3/uL (ref 0.00–0.07)
Basophils Absolute: 0 10*3/uL (ref 0.0–0.1)
Basophils Relative: 0 %
Eosinophils Absolute: 0 10*3/uL (ref 0.0–0.5)
Eosinophils Relative: 0 %
HCT: 38.9 % — ABNORMAL LOW (ref 39.0–52.0)
Hemoglobin: 11.9 g/dL — ABNORMAL LOW (ref 13.0–17.0)
Immature Granulocytes: 1 %
Lymphocytes Relative: 10 %
Lymphs Abs: 0.5 10*3/uL — ABNORMAL LOW (ref 0.7–4.0)
MCH: 28.6 pg (ref 26.0–34.0)
MCHC: 30.6 g/dL (ref 30.0–36.0)
MCV: 93.5 fL (ref 80.0–100.0)
Monocytes Absolute: 0.2 10*3/uL (ref 0.1–1.0)
Monocytes Relative: 3 %
Neutro Abs: 4 10*3/uL (ref 1.7–7.7)
Neutrophils Relative %: 86 %
Platelets: 219 10*3/uL (ref 150–400)
RBC: 4.16 MIL/uL — ABNORMAL LOW (ref 4.22–5.81)
RDW: 15.7 % — ABNORMAL HIGH (ref 11.5–15.5)
WBC: 4.7 10*3/uL (ref 4.0–10.5)
nRBC: 0 % (ref 0.0–0.2)

## 2019-09-23 LAB — D-DIMER, QUANTITATIVE: D-Dimer, Quant: 2.25 ug/mL-FEU — ABNORMAL HIGH (ref 0.00–0.50)

## 2019-09-23 LAB — PREPARE FRESH FROZEN PLASMA

## 2019-09-23 LAB — BPAM FFP
Blood Product Expiration Date: 202010132021
ISSUE DATE / TIME: 202010122024
Unit Type and Rh: 6200

## 2019-09-23 LAB — FERRITIN: Ferritin: 1133 ng/mL — ABNORMAL HIGH (ref 24–336)

## 2019-09-23 LAB — C-REACTIVE PROTEIN: CRP: 19.5 mg/dL — ABNORMAL HIGH (ref <1.0)

## 2019-09-23 LAB — MAGNESIUM: Magnesium: 2.1 mg/dL (ref 1.7–2.4)

## 2019-09-23 MED ORDER — CHLORHEXIDINE GLUCONATE CLOTH 2 % EX PADS
6.0000 | MEDICATED_PAD | Freq: Every day | CUTANEOUS | Status: DC
  Administered 2019-09-23 – 2019-09-28 (×5): 6 via TOPICAL

## 2019-09-23 NOTE — Progress Notes (Signed)
TR[facility] night shift note.  The patient has developed some fine crackles while receiving convalescent plasma. He was on Furosemide 40 mg IVP BID, but it was discontinued later in the day. He last received it at [place] yesterday. His SBP has been in the 80s and 90s. Creatinine had a 24 hr interval increase from 1.21 2 days ago to 1.42 mg/dL yesterday morning. We will deferred further diuretic use for now and continue to observe clinically.   Tennis Must, MD

## 2019-09-23 NOTE — Progress Notes (Signed)
Late entry for 09/23/19 @ 2045. Patient seen and assessed. Patient found in bed (HOB 30 degrees) with soiled gown and telemetry leads off. Physical assessment completed via computerized charting per [facility] policy. Patient gown changed. Telemetry leads replaced. No family present at bedside. Side rails up times 2. Call light in reach.

## 2019-09-23 NOTE — Progress Notes (Signed)
Aaron Daniels  RUE:[id number] DOB: 19-Mar-1941 DOA: 09/21/2019 PCP: Albertha Ghee, MD    Brief Narrative:  78 year old with a history of HTN, BPH status post TURP, COPD requiring 3 L home O2, and diastolic CHF who was discharged 9/28 after admission for hypoxic respiratory failure.  He returned to the ED with 2 to 3 days of progressively worsening lethargy with loose stools, and severe shortness of breath.  The patient's grandson found him with obvious shortness of breath clutching his chest and very somnolent.  In the [facility] ED he displayed increased work of breathing and was hypoxic requiring 10 L via Venturi mask to keep sats at 88 or better.  He was admitted initially with a diagnosis of an acute CHF exacerbation.  He was later found to be Covid positive.  Significant Events: 9/28 D/C home (non-COVID admit to [facility]) 9/23 SARS-CoV-2 negative 9/24-9/28 admit to [facility] for acute hypoxic respiratory failure -?CHF and COP exac 10/11 admit to [facility] on transfer from [facility] IM Teaching Service   COVID-19 specific Treatment: Decadron 10/12 > Remdesivir 10/12 > Convalescent plasma 10/12  Subjective: The patient has significantly improved overnight.  At the time of my exam he is on [place] high flow nasal cannula, alert oriented, in no distress, with stable saturations.  He was able to eat a good breakfast and is resting comfortably.  He denies chest pain nausea or vomiting.  He appears to have tolerated his plasma transfusion without major complications.  Assessment & Plan:  Covid pneumonia - acute on chronic hypoxic respiratory failure CT angio chest negative for PE but remarkable for severe diffuse pulmonary infiltrates - pt requiring high level O2 support - high risk for rapid decline -after counseling the patient was not in favor of using Actemra -patient did consent for convalescent plasma which was transfused 10/12 evening -appears to be improving at this time but remains quite tenuous -continue  supportive care in the progressive care unit  Recent Labs  Lab 09/21/19 1543 09/22/19 1030 09/23/19 0511  DDIMER  --   --  2.25*  FERRITIN  --  995* 1,133*  CRP  --  19.8* 19.5*  ALT 61* 51* 44  PROCALCITON  --  0.14  --     Covid gastroenteritis Approximately 7 days of frequent diarrhea at time of admission - follow lytes - symptomatic control -reports cessation of diarrhea this morning  Diastolic CHF  EF 14-78% per TTE - baseline weight appears to be 100.2 kg - does not appear grossly overloaded on exam - hold diuretic in setting of persisting hypotension   Filed Weights   09/21/19 1522  Weight: 101 kg   Hypotension Most c/w dehydration due to poor intake and increased GI losses -slowly improving -attempting to keep patient on the dehydrated side given the severity of his pulmonary infiltrates -mean arterial pressure consistently remains 60 or >  COPD on chronic home oxygen Quiescent presently  Mild transaminitis Likely low-grade shock liver -normalized as of this morning  Recent Labs  Lab 09/21/19 1543 09/22/19 1030 09/23/19 0511  AST 78* 55* 39  ALT 61* 51* 44  ALKPHOS 48 44 47  BILITOT 0.3 0.8 0.4  PROT 5.6* 5.6* 5.7*  ALBUMIN 2.3* 2.4* 2.5*    Acute kidney injury Likely due to ATN in setting of hypotension - follow trend   Recent Labs  Lab 09/21/19 1543 09/22/19 0403 09/22/19 1030 09/23/19 0511  CREATININE 1.38* 1.21 1.42* 1.24    Iron deficiency anemia Hemoglobin 12.3 at time  of previous admission - no evidence of acute blood loss -hemoglobin stable  DM2 Is not on home medications -A1c in September 6.5 - follow CBGs without change today  HTN Not an active issue at this time   Suspicion for OSA Heavy snoring - evaluated in PCCM clinic 10/5  DVT prophylaxis: lovenox  Code Status: DNR - NO CODE  Family Communication: spoke w/ son on phone in room  Disposition Plan: PCU -titrate oxygen support as needed -can consider repeat discussion on  Actemra if patient declines again -not yet ready for physical therapy -encourage incentive spirometer  Consultants:  none  Antimicrobials:  None   Objective: Blood pressure 95/66, pulse 91, temperature 97.6 F (36.4 C), temperature source Oral, resp. rate 19, height 5\' 11"  (1.803 m), weight 101 kg, SpO2 93 %.  Intake/Output Summary (Last 24 hours) at 09/23/2019 1022 Last data filed at 09/23/2019 0908 Gross per 24 hour  Intake 1940.82 ml  Output 300 ml  Net 1640.82 ml   Filed Weights   09/21/19 1522  Weight: 101 kg    Examination: General: Comfortable respirations with high flow nasal cannula support Lungs: Fine diffuse crackles bilateral fields with no wheezing Cardiovascular: RRR distant heart sounds Abdomen: NT/ND, soft, BS positive, no rebound Extremities: no C/C/E B LE   CBC: Recent Labs  Lab 09/21/19 1543  09/22/19 0403 09/22/19 1030 09/23/19 0511  WBC 6.8  --  5.9 7.2 4.7  NEUTROABS 5.9  --   --  6.3 4.0  HGB 12.8*   < > 11.9* 11.7* 11.9*  HCT 41.3   < > 39.2 38.2* 38.9*  MCV 93.4  --  93.1 93.4 93.5  PLT 180  --  172 183 219   < > = values in this interval not displayed.   Basic Metabolic Panel: Recent Labs  Lab 09/22/19 0403 09/22/19 1030 09/23/19 0511  NA 137 137 140  K 4.3 4.5 4.7  CL 98 98 99  CO2 29 30 29   GLUCOSE 121* 129* 194*  BUN 18 22 43*  CREATININE 1.21 1.42* 1.24  CALCIUM 7.7* 7.9* 8.4*  MG  --   --  2.1   GFR: Estimated Creatinine Clearance: 57.5 mL/min (by C-G formula based on SCr of 1.24 mg/dL).  Liver Function Tests: Recent Labs  Lab 09/21/19 1543 09/22/19 1030 09/23/19 0511  AST 78* 55* 39  ALT 61* 51* 44  ALKPHOS 48 44 47  BILITOT 0.3 0.8 0.4  PROT 5.6* 5.6* 5.7*  ALBUMIN 2.3* 2.4* 2.5*    HbA1C: Hgb A1c MFr Bld  Date/Time Value Ref Range Status  09/04/2019 06:12 PM 6.5 (H) 4.8 - 5.6 % Final    Comment:    (NOTE) Pre diabetes:          5.7%-6.4% Diabetes:              >6.4% Glycemic control for   <7.0%  adults with diabetes     Recent Results (from the past 240 hour(s))  SARS CORONAVIRUS 2 (TAT 6-24 HRS) Nasopharyngeal Nasopharyngeal Swab     Status: Abnormal   Collection Time: 09/21/19  3:43 PM   Specimen: Nasopharyngeal Swab  Result Value Ref Range Status   SARS Coronavirus 2 POSITIVE (A) NEGATIVE Final    Comment: RESULT CALLED TO, READ BACK BY AND VERIFIED WITH: SRonny Flurry. WILSON,RN 2147 09/21/2019 T. TYSOR (NOTE) SARS-CoV-2 target nucleic acids are DETECTED. The SARS-CoV-2 RNA is generally detectable in upper and lower respiratory specimens during the acute phase of infection.  Positive results are indicative of active infection with SARS-CoV-2. Clinical  correlation with patient history and other diagnostic information is necessary to determine patient infection status. Positive results do  not rule out bacterial infection or co-infection with other viruses. The expected result is Negative. Fact Sheet for Patients: SugarRoll.be Fact Sheet for Healthcare Providers: https://www.woods-mathews.com/ This test is not yet approved or cleared by the [place] FDA and  has been authorized for detection and/or diagnosis of SARS-CoV-2 by FDA under an Emergency Use Authorization (EUA). This EUA will remain  in effect (meaning this test can be used) for  the duration of the COVID-19 declaration under Section 564(b)(1) of the Act, 21 U.S.C. section 360bbb-3(b)(1), unless the authorization is terminated or revoked sooner. Performed at [facility], [place] [address], [place], [place] [address]   [place]  Scheduled Meds: . sodium chloride   Intravenous Once  . Chlorhexidine Gluconate Cloth  6 each Topical Daily  . dexamethasone (DECADRON) injection  6 mg Intravenous Q24H  . enoxaparin (LOVENOX) injection  40 mg Subcutaneous Daily  . umeclidinium-vilanterol  1 puff Inhalation Daily  . vitamin C  500 mg Oral Daily  . zinc sulfate  220 mg Oral Daily    Continuous Infusions: . remdesivir 100 mg in NS 250 mL       LOS: 2 days   Cherene Altes, MD [facility] Office  [phone] Pager - Text Page per Amion  If 7PM-7AM, please contact night-coverage per Amion 09/23/2019, 10:22 AM

## 2019-09-23 NOTE — Progress Notes (Addendum)
Nursing rounds completed. Patient continues to receive convalescent plasma via infusion pump. Rate increased from 75 ml/hr to 80 ml/hr. Vital signs obtained and entered via computerized charting per [place]. Side rails up time 3. Bed in lowest position and locked. Call light in reach. Continue to monitor  09/23/19 @ 0215-on call provider called related to staff nurse concern for new fine crackles auscultated at posterior lobes bilateral while convalescent plasma infusing   09/23/19 @ 0244: On call provider returns call. Update given on patient condition and concerns of staff nurse r/t new onset of crackles at bases posterior (bilateraly) during convalescent plasma infusion. Explained that current rate is 80 ml/hr (was increased from 75 ml/hr). Suggestion made for lasix IV. Provider expressed concern with BUN/Cr labs. No orders received for diruetic therapy at this time.   Late Entry for 09/22/19 @ 2355: Nursing rounds completed. Explained to patient that he will be receiving convalescent plasma, a therapy for Covid 19. Opportunity provided for patient to ask question and voice concerns. Permit signed by patient and witness by staff nurse.   Late Entry for 09/23/19 @ 0019: Convalescent plasma begins infusion

## 2019-09-23 NOTE — Progress Notes (Addendum)
Family Updates   Primary Nurse and Physican updated patients son [name] during bedside rounds at 1100. Patient son updated 6 times at the time of entry due to frequent calls requesting vital sign updates or to speak with his father. Patients family is anxious for him to recover and come home due to his spouse having dementia. The patient is his spouse primary caregiver. Primary nurse to notify SW/CM to get them involved with discharge planning.

## 2019-09-24 DIAGNOSIS — R739 Hyperglycemia, unspecified: Secondary | ICD-10-CM

## 2019-09-24 DIAGNOSIS — R338 Other retention of urine: Secondary | ICD-10-CM

## 2019-09-24 DIAGNOSIS — E875 Hyperkalemia: Secondary | ICD-10-CM

## 2019-09-24 DIAGNOSIS — J9601 Acute respiratory failure with hypoxia: Secondary | ICD-10-CM

## 2019-09-24 DIAGNOSIS — J1289 Other viral pneumonia: Secondary | ICD-10-CM

## 2019-09-24 LAB — CBC WITH DIFFERENTIAL/PLATELET
Abs Immature Granulocytes: 0.1 10*3/uL — ABNORMAL HIGH (ref 0.00–0.07)
Basophils Absolute: 0 10*3/uL (ref 0.0–0.1)
Basophils Relative: 0 %
Eosinophils Absolute: 0 10*3/uL (ref 0.0–0.5)
Eosinophils Relative: 0 %
HCT: 37.6 % — ABNORMAL LOW (ref 39.0–52.0)
Hemoglobin: 11.4 g/dL — ABNORMAL LOW (ref 13.0–17.0)
Immature Granulocytes: 1 %
Lymphocytes Relative: 6 %
Lymphs Abs: 0.5 10*3/uL — ABNORMAL LOW (ref 0.7–4.0)
MCH: 28.3 pg (ref 26.0–34.0)
MCHC: 30.3 g/dL (ref 30.0–36.0)
MCV: 93.3 fL (ref 80.0–100.0)
Monocytes Absolute: 0.3 10*3/uL (ref 0.1–1.0)
Monocytes Relative: 4 %
Neutro Abs: 6.6 10*3/uL (ref 1.7–7.7)
Neutrophils Relative %: 89 %
Platelets: 258 10*3/uL (ref 150–400)
RBC: 4.03 MIL/uL — ABNORMAL LOW (ref 4.22–5.81)
RDW: 15.4 % (ref 11.5–15.5)
WBC: 7.5 10*3/uL (ref 4.0–10.5)
nRBC: 0 % (ref 0.0–0.2)

## 2019-09-24 LAB — COMPREHENSIVE METABOLIC PANEL
ALT: 40 U/L (ref 0–44)
AST: 31 U/L (ref 15–41)
Albumin: 2.2 g/dL — ABNORMAL LOW (ref 3.5–5.0)
Alkaline Phosphatase: 48 U/L (ref 38–126)
Anion gap: 9 (ref 5–15)
BUN: 53 mg/dL — ABNORMAL HIGH (ref 8–23)
CO2: 30 mmol/L (ref 22–32)
Calcium: 8.5 mg/dL — ABNORMAL LOW (ref 8.9–10.3)
Chloride: 102 mmol/L (ref 98–111)
Creatinine, Ser: 1.19 mg/dL (ref 0.61–1.24)
GFR calc Af Amer: >60 mL/min (ref >60)
GFR calc non Af Amer: 57 mL/min — ABNORMAL LOW (ref >60)
Glucose, Bld: 223 mg/dL — ABNORMAL HIGH (ref 70–99)
Potassium: 5.6 mmol/L — ABNORMAL HIGH (ref 3.5–5.1)
Sodium: 141 mmol/L (ref 135–145)
Total Bilirubin: 0.3 mg/dL (ref 0.3–1.2)
Total Protein: 5.5 g/dL — ABNORMAL LOW (ref 6.5–8.1)

## 2019-09-24 LAB — C-REACTIVE PROTEIN: CRP: 10.5 mg/dL — ABNORMAL HIGH (ref <1.0)

## 2019-09-24 LAB — GLUCOSE, CAPILLARY
Glucose-Capillary: 335 mg/dL — ABNORMAL HIGH (ref 70–99)
Glucose-Capillary: 311 mg/dL — ABNORMAL HIGH (ref 70–99)

## 2019-09-24 LAB — POTASSIUM: Potassium: 4.7 mmol/L (ref 3.5–5.1)

## 2019-09-24 LAB — FERRITIN: Ferritin: 1027 ng/mL — ABNORMAL HIGH (ref 24–336)

## 2019-09-24 LAB — D-DIMER, QUANTITATIVE: D-Dimer, Quant: 2.57 ug/mL-FEU — ABNORMAL HIGH (ref 0.00–0.50)

## 2019-09-24 LAB — MAGNESIUM: Magnesium: 2.3 mg/dL (ref 1.7–2.4)

## 2019-09-24 MED ORDER — INSULIN ASPART 100 UNIT/ML ~~LOC~~ SOLN
0.0000 [IU] | Freq: Every day | SUBCUTANEOUS | Status: DC
  Administered 2019-09-24 – 2019-09-25 (×2): 4 [IU] via SUBCUTANEOUS
  Administered 2019-09-26: 3 [IU] via SUBCUTANEOUS
  Administered 2019-09-27: 2 [IU] via SUBCUTANEOUS
  Administered 2019-09-28: 3 [IU] via SUBCUTANEOUS
  Administered 2019-09-29: 5 [IU] via SUBCUTANEOUS

## 2019-09-24 MED ORDER — FAMOTIDINE 20 MG PO TABS
20.0000 mg | ORAL_TABLET | Freq: Every day | ORAL | Status: DC
  Administered 2019-09-24 – 2019-09-30 (×7): 20 mg via ORAL
  Filled 2019-09-24 (×7): qty 1

## 2019-09-24 MED ORDER — INSULIN ASPART 100 UNIT/ML ~~LOC~~ SOLN
0.0000 [IU] | Freq: Three times a day (TID) | SUBCUTANEOUS | Status: DC
  Administered 2019-09-24: 18:00:00 11 [IU] via SUBCUTANEOUS
  Administered 2019-09-25: 11:00:00 2 [IU] via SUBCUTANEOUS
  Administered 2019-09-25: 8 [IU] via SUBCUTANEOUS
  Administered 2019-09-26: 5 [IU] via SUBCUTANEOUS
  Administered 2019-09-26 – 2019-09-27 (×3): 3 [IU] via SUBCUTANEOUS
  Administered 2019-09-27: 15 [IU] via SUBCUTANEOUS
  Administered 2019-09-28: 5 [IU] via SUBCUTANEOUS
  Administered 2019-09-28: 8 [IU] via SUBCUTANEOUS
  Administered 2019-09-29: 11 [IU] via SUBCUTANEOUS
  Administered 2019-09-29: 15 [IU] via SUBCUTANEOUS
  Administered 2019-09-30: 5 [IU] via SUBCUTANEOUS
  Administered 2019-09-30: 2 [IU] via SUBCUTANEOUS

## 2019-09-24 MED ORDER — SODIUM ZIRCONIUM CYCLOSILICATE 10 G PO PACK
10.0000 g | PACK | Freq: Once | ORAL | Status: AC
  Administered 2019-09-24: 10 g via ORAL
  Filled 2019-09-24: qty 1

## 2019-09-24 NOTE — Progress Notes (Signed)
PROGRESS NOTE  Aaron Daniels ZWC:[id number] DOB: 1941-10-02 DOA: 09/21/2019  PCP: [name], MD  Brief History/Interval Summary: 78 year old with a history of HTN, BPH status post TURP, COPD requiring 3 L home O2, and diastolic CHF who was discharged 9/28 after admission for hypoxic respiratory failure.  He returned to the ED with 2 to 3 days of progressively worsening lethargy with loose stools, and severe shortness of breath.  The patient's grandson found him with obvious shortness of breath clutching his chest and very somnolent.  In the [facility] ED he displayed increased work of breathing and was hypoxic requiring 10 L via Venturi mask to keep sats at 88 or better.  He was admitted initially with a diagnosis of an acute CHF exacerbation.  He was later found to be Covid positive.   Reason for Visit: Acute respiratory failure with hypoxia.  Pneumonia due to COVID-19  Consultants: None  Procedures: None  Antibiotics: Anti-infectives (From admission, onward)   Start     Dose/Rate Route Frequency Ordered Stop   09/23/19 1000  remdesivir 100 mg in sodium chloride 0.9 % 250 mL IVPB     100 mg 500 mL/hr over 30 Minutes Intravenous Every 24 hours 09/22/19 0936 09/27/19 0959   09/22/19 1030  remdesivir 200 mg in sodium chloride 0.9 % 250 mL IVPB     200 mg 500 mL/hr over 30 Minutes Intravenous Once 09/22/19 0936 09/22/19 1259      Subjective/Interval History: Patient states that he is feeling better.  He was on high flow nasal cannula at 15 L.  Denies any chest pain.  No nausea vomiting.  Distracted.  Poor historian.    Assessment/Plan:  Acute Hypoxic Resp. Failure/Pneumonia due to COVID-19  COVID-19 Labs  Recent Labs    09/22/19 1030 09/23/19 0511 09/24/19 0345  DDIMER  --  2.25* 2.57*  FERRITIN 995* 1,133* 1,027*  CRP 19.8* 19.5* 10.5*    Lab Results  Component Value Date   SARSCOV2NAA POSITIVE (A) 09/21/2019   [place] NEGATIVE 09/03/2019   [place] NEGATIVE  07/03/2019     Fever: Afebrile over the last 48 hours Oxygen requirements: High flow nasal cannula at 15 L/min.  He was saturating in the mid to late 90s.  He was dropped down to 13 L. Antibacterials: None Remdesivir: Day 3 Steroids: Dexamethasone 6 mg daily Diuretics: Not being given on a scheduled basis Actemra: Not given yet Convalescent plasma: Given on 10/12 Vitamin C and Zinc: Continue DVT Prophylaxis:  Lovenox 40 mg daily  Patient's respiratory status remains tenuous.  He is still requiring high flow nasal cannula at 13 to 15 L/min.  Hopefully we can continue to wean him down as he is feeling better.  CRP has improved from 19.5-10.5 today.  Continue incentive spirometry.  CT angiogram was negative for PE.  Previous rounding physician did discuss Actemra but patient did not want it.  Patient was given convalescent plasma.  Continue to monitor closely for now.  Continue to trend inflammatory markers.  D-dimer noted to be elevated.  Patient is on Lovenox.  Gastroenteritis secondary to COVID-19 Apparently he had 7 days of frequent diarrhea.  Seems to have improved.  Continue to monitor closely.  Hyperkalemia Potassium noted to be 5.6 today.  Not on any supplements.  He will be given Lokelma.  Recheck potassium level.  Chronic diastolic CHF EF 65 to 23% per echocardiogram.  Does not appear to be volume overloaded.  Hypotension Noted to have borderline low blood pressures.  Appears to have stabilized.  Might have been due to hypovolemia.  History of COPD on chronic home oxygen Stable currently.  Requiring high doses of oxygen as discussed above.  Mild transaminitis Has improved.  Possibly due to COVID-19.  Normocytic anemia No evidence of overt bleeding.  Hemoglobin is stable.  Hyperglycemia due to steroids/history of diabetes mellitus type 2 May need sliding scale coverage.  Apparently was not on any medications for same.  HbA1c 6.5 in September.  Acute kidney injury Most  likely due to ATN in the setting of hypotension.  Improved.  Monitor urine output.  Acute urinary retention Has a Foley in place.  Will need to do a voiding trial soon once he is able to mobilize.    Suspicion for OSA Apparently heavy snoring noted.  Evaluated in pulmonology clinic 10/5.  Will need outpatient follow-up.  Obesity Estimated body mass index is 31.06 kg/m as calculated from the following:   Height as of this encounter: 5\' 11"  (1.803 m).   Weight as of this encounter: 101 kg.   DVT Prophylaxis: Lovenox PUD Prophylaxis: Initiate Pepcid Code Status: Full code Family Communication: We will discuss with family member Disposition Plan: Continue management as outlined above.  Disposition is unknown.  PT and OT evaluation.   Medications:  Scheduled: . Chlorhexidine Gluconate Cloth  6 each Topical Daily  . dexamethasone (DECADRON) injection  6 mg Intravenous Q24H  . enoxaparin (LOVENOX) injection  40 mg Subcutaneous Daily  . umeclidinium-vilanterol  1 puff Inhalation Daily  . vitamin C  500 mg Oral Daily  . zinc sulfate  220 mg Oral Daily   Continuous: . remdesivir 100 mg in NS 250 mL 100 mg (09/24/19 0916)   09/26/19 **OR** [DISCONTINUED] acetaminophen, albuterol, chlorpheniramine-HYDROcodone, guaiFENesin-dextromethorphan, morphine injection   Objective:  Vital Signs  Vitals:   09/23/19 2354 09/24/19 0521 09/24/19 0727 09/24/19 1122  BP: 97/69 104/77 105/72 115/78  Pulse:   92 87  Resp:   20 16  Temp: 98.1 F (36.7 C) (!) 97.2 F (36.2 C) 97.7 F (36.5 C) 98.1 F (36.7 C)  TempSrc: Axillary Axillary Oral Oral  SpO2:   92% 96%  Weight:      Height:        Intake/Output Summary (Last 24 hours) at 09/24/2019 1425 Last data filed at 09/24/2019 1230 Gross per 24 hour  Intake 960 ml  Output 1300 ml  Net -340 ml   Filed Weights   09/21/19 1522  Weight: 101 kg    General appearance: Awake alert.  In no distress.  Mildly distracted. Resp:  Mildly tachypneic at rest.  Coarse breath sounds bilaterally.  No wheezing or rhonchi. Cardio: S1-S2 is normal regular.  No S3-S4.  No rubs murmurs or bruit GI: Abdomen is soft.  Nontender nondistended.  Bowel sounds are present normal.  No masses organomegaly Extremities: No edema.  Moving all his extremities Neurologic: Awake alert.  No focal deficits appreciated.   Lab Results:  Data Reviewed: I have personally reviewed following labs and imaging studies  CBC: Recent Labs  Lab 09/21/19 1543 09/21/19 1736 09/22/19 0403 09/22/19 1030 09/23/19 0511 09/24/19 0345  WBC 6.8  --  5.9 7.2 4.7 7.5  NEUTROABS 5.9  --   --  6.3 4.0 6.6  HGB 12.8* 11.9* 11.9* 11.7* 11.9* 11.4*  HCT 41.3 35.0* 39.2 38.2* 38.9* 37.6*  MCV 93.4  --  93.1 93.4 93.5 93.3  PLT 180  --  172 183 219 258  Basic Metabolic Panel: Recent Labs  Lab 09/21/19 1543 09/21/19 1736 09/22/19 0403 09/22/19 1030 09/23/19 0511 09/24/19 0345  NA 135 135 137 137 140 141  K 4.3 3.9 4.3 4.5 4.7 5.6*  CL 97*  --  98 98 99 102  CO2 27  --  29 30 29 30   GLUCOSE 151*  --  121* 129* 194* 223*  BUN 19  --  18 22 43* 53*  CREATININE 1.38*  --  1.21 1.42* 1.24 1.19  CALCIUM 8.0*  --  7.7* 7.9* 8.4* 8.5*  MG  --   --   --   --  2.1 2.3    GFR: Estimated Creatinine Clearance: 59.9 mL/min (by C-G formula based on SCr of 1.19 mg/dL).  Liver Function Tests: Recent Labs  Lab 09/21/19 1543 09/22/19 1030 09/23/19 0511 09/24/19 0345  AST 78* 55* 39 31  ALT 61* 51* 44 40  ALKPHOS 48 44 47 48  BILITOT 0.3 0.8 0.4 0.3  PROT 5.6* 5.6* 5.7* 5.5*  ALBUMIN 2.3* 2.4* 2.5* 2.2*    Anemia Panel: Recent Labs    09/23/19 0511 09/24/19 0345  FERRITIN 1,133* 1,027*    Recent Results (from the past 240 hour(s))  SARS CORONAVIRUS 2 (TAT 6-24 HRS) Nasopharyngeal Nasopharyngeal Swab     Status: Abnormal   Collection Time: 09/21/19  3:43 PM   Specimen: Nasopharyngeal Swab  Result Value Ref Range Status   SARS Coronavirus 2  POSITIVE (A) NEGATIVE Final    Comment: RESULT CALLED TO, READ BACK BY AND VERIFIED WITH: SRonny Flurry. WILSON,RN 2147 09/21/2019 T. TYSOR (NOTE) SARS-CoV-2 target nucleic acids are DETECTED. The SARS-CoV-2 RNA is generally detectable in upper and lower respiratory specimens during the acute phase of infection. Positive results are indicative of active infection with SARS-CoV-2. Clinical  correlation with patient history and other diagnostic information is necessary to determine patient infection status. Positive results do  not rule out bacterial infection or co-infection with other viruses. The expected result is Negative. Fact Sheet for Patients: HairSlick.nohttps://www.fda.gov/media/138098/download Fact Sheet for Healthcare Providers: quierodirigir.comhttps://www.fda.gov/media/138095/download This test is not yet approved or cleared by the [place] FDA and  has been authorized for detection and/or diagnosis of SARS-CoV-2 by FDA under an Emergency Use Authorization (EUA). This EUA will remain  in effect (meaning this test can be used) for  the duration of the COVID-19 declaration under Section 564(b)(1) of the Act, 21 U.S.C. section 360bbb-3(b)(1), unless the authorization is terminated or revoked sooner. Performed at [facility], [address] [place], [place] [address]       Radiology Studies: No results found.     LOS: 3 days   Amberlynn Tempesta [employer]Krishnan  [facility] Pager on [email or web address]  09/24/2019, 2:25 PM

## 2019-09-24 NOTE — TOC Initial Note (Signed)
Transition of Care [facility]) - Initial/Assessment Note    Patient Details  Name: Aaron Daniels MRN: [id number] Date of Birth: June 01, 1941  [facility] CM/SW Contact:    Ninfa Meeker, RN Phone Number: 09/24/2019, 12:32 PM  Clinical Narrative:  78 year old with a history of HTN, BPH status post TURP, COPD requiring 3 L home O2, and diastolic CHF who was discharged 9/28 after admission for hypoxic respiratory failure.  He returned to the ED with 2 to 3 days of progressively worsening lethargy with loose stools, and severe shortness of breath. Patient readmitted for COVID 19 pneumonia, on 8-10L venturi mask. IV Steriods.  Patient lives home with family.Case manager will continue to monitor  For needs as patient medixally improves, may he be blessed to do so.                  Patient Goals and CMS Choice        Expected Discharge Plan and Services                                                Prior Living Arrangements/Services                       Activities of Daily Living Home Assistive Devices/Equipment: None ADL Screening (condition at time of admission) Patient's cognitive ability adequate to safely complete daily activities?: No Is the patient deaf or have difficulty hearing?: No Does the patient have difficulty seeing, even when wearing glasses/contacts?: No Does the patient have difficulty concentrating, remembering, or making decisions?: No Patient able to express need for assistance with ADLs?: Yes Does the patient have difficulty dressing or bathing?: Yes Independently performs ADLs?: No Communication: Independent Dressing (OT): Needs assistance Is this a change from baseline?: Pre-admission baseline Grooming: Needs assistance Is this a change from baseline?: Pre-admission baseline Feeding: Needs assistance Is this a change from baseline?: Pre-admission baseline Bathing: Needs assistance Is this a change from baseline?:  Pre-admission baseline Toileting: Dependent Is this a change from baseline?: Pre-admission baseline In/Out Bed: Needs assistance Is this a change from baseline?: Pre-admission baseline Walks in Home: Independent with device (comment) Does the patient have difficulty walking or climbing stairs?: No Weakness of Legs: Both Weakness of Arms/Hands: Both  Permission Sought/Granted                  Emotional Assessment              Admission diagnosis:  COVID-19 [U07.1] Patient Active Problem List   Diagnosis Date Noted  . Hypoxia 09/21/2019  . COVID-19 09/21/2019  . Diarrhea 09/17/2019  . Snoring 09/15/2019  . COPD (chronic obstructive pulmonary disease) ([place]) 09/11/2019  . HTN (hypertension) 09/11/2019  . Heart failure with preserved ejection fraction ([place]) 09/07/2019  . COPD exacerbation ([place]) 09/07/2019  . Dyspnea 09/04/2019  . Chronic respiratory failure with hypoxia ([place]) 09/03/2019   PCP:  [name], MD Pharmacy:   [id number] [place], [address] [address] Phone: [phone] Fax: [phone]     Social Determinants of Health (SDOH) Interventions    Readmission Risk Interventions No flowsheet data found.

## 2019-09-24 NOTE — Progress Notes (Addendum)
Family notification Patient son called for update while primary nurse was occupied with another patient. Primary nurse returned call and left message for [name] (son) to call back at his earliest convinence.  1805 Patient and primary nurse spoke with [name] (son) to give POC and status update.

## 2019-09-24 NOTE — Progress Notes (Addendum)
Primary RN notified Aaron Ditch LCSW with concerns about discharge planning. APS called Primary RN on Monday to state they received a report of self neglect and caregiver issues concerning patient and his spouse whom has dementia. Per APS, the patient and spouse are hoarders. [name] lives a few miles away from the parents and seems to have some involvement but the patient whom is in ill health is the primary caregiver for the wife. Primary RN gave APS caseworker [place]well [place] [phone] information to [facility] LCSW so discharge planning can begin.

## 2019-09-24 NOTE — Progress Notes (Signed)
Late Entry for 09/24/19 @ 2045: Patient seen and assessed. Physical assessment completed via computerized charting per [facility] policy. No acute distress noted. Patient remains alert, oriented x two. Had to re orient to time. No family present at bedside. Side rails up x 3. Call light in reach.

## 2019-09-25 LAB — GLUCOSE, CAPILLARY
Glucose-Capillary: 150 mg/dL — ABNORMAL HIGH (ref 70–99)
Glucose-Capillary: 296 mg/dL — ABNORMAL HIGH (ref 70–99)
Glucose-Capillary: 409 mg/dL — ABNORMAL HIGH (ref 70–99)
Glucose-Capillary: 318 mg/dL — ABNORMAL HIGH (ref 70–99)

## 2019-09-25 LAB — COMPREHENSIVE METABOLIC PANEL
ALT: 55 U/L — ABNORMAL HIGH (ref 0–44)
AST: 52 U/L — ABNORMAL HIGH (ref 15–41)
Albumin: 2.2 g/dL — ABNORMAL LOW (ref 3.5–5.0)
Alkaline Phosphatase: 51 U/L (ref 38–126)
Anion gap: 9 (ref 5–15)
BUN: 41 mg/dL — ABNORMAL HIGH (ref 8–23)
CO2: 32 mmol/L (ref 22–32)
Calcium: 8.7 mg/dL — ABNORMAL LOW (ref 8.9–10.3)
Chloride: 102 mmol/L (ref 98–111)
Creatinine, Ser: 1.05 mg/dL (ref 0.61–1.24)
GFR calc Af Amer: >60 mL/min (ref >60)
GFR calc non Af Amer: >60 mL/min (ref >60)
Glucose, Bld: 162 mg/dL — ABNORMAL HIGH (ref 70–99)
Potassium: 4.6 mmol/L (ref 3.5–5.1)
Sodium: 143 mmol/L (ref 135–145)
Total Bilirubin: 0.3 mg/dL (ref 0.3–1.2)
Total Protein: 5.4 g/dL — ABNORMAL LOW (ref 6.5–8.1)

## 2019-09-25 LAB — CBC WITH DIFFERENTIAL/PLATELET
Abs Immature Granulocytes: 0.07 10*3/uL (ref 0.00–0.07)
Basophils Absolute: 0 10*3/uL (ref 0.0–0.1)
Basophils Relative: 0 %
Eosinophils Absolute: 0 10*3/uL (ref 0.0–0.5)
Eosinophils Relative: 0 %
HCT: 39.1 % (ref 39.0–52.0)
Hemoglobin: 11.6 g/dL — ABNORMAL LOW (ref 13.0–17.0)
Immature Granulocytes: 1 %
Lymphocytes Relative: 6 %
Lymphs Abs: 0.5 10*3/uL — ABNORMAL LOW (ref 0.7–4.0)
MCH: 28.1 pg (ref 26.0–34.0)
MCHC: 29.7 g/dL — ABNORMAL LOW (ref 30.0–36.0)
MCV: 94.7 fL (ref 80.0–100.0)
Monocytes Absolute: 0.4 10*3/uL (ref 0.1–1.0)
Monocytes Relative: 6 %
Neutro Abs: 6.7 10*3/uL (ref 1.7–7.7)
Neutrophils Relative %: 87 %
Platelets: 274 10*3/uL (ref 150–400)
RBC: 4.13 MIL/uL — ABNORMAL LOW (ref 4.22–5.81)
RDW: 15.3 % (ref 11.5–15.5)
WBC: 7.7 10*3/uL (ref 4.0–10.5)
nRBC: 0 % (ref 0.0–0.2)

## 2019-09-25 LAB — FERRITIN: Ferritin: 815 ng/mL — ABNORMAL HIGH (ref 24–336)

## 2019-09-25 LAB — MAGNESIUM: Magnesium: 2.1 mg/dL (ref 1.7–2.4)

## 2019-09-25 LAB — C-REACTIVE PROTEIN: CRP: 6.1 mg/dL — ABNORMAL HIGH (ref <1.0)

## 2019-09-25 LAB — D-DIMER, QUANTITATIVE: D-Dimer, Quant: 1.95 ug/mL-FEU — ABNORMAL HIGH (ref 0.00–0.50)

## 2019-09-25 MED ORDER — INSULIN GLARGINE 100 UNIT/ML ~~LOC~~ SOLN
15.0000 [IU] | Freq: Every day | SUBCUTANEOUS | Status: DC
  Administered 2019-09-25 – 2019-09-30 (×6): 15 [IU] via SUBCUTANEOUS
  Filled 2019-09-25 (×6): qty 0.15

## 2019-09-25 MED ORDER — INSULIN ASPART 100 UNIT/ML ~~LOC~~ SOLN
4.0000 [IU] | Freq: Three times a day (TID) | SUBCUTANEOUS | Status: DC
  Administered 2019-09-25 – 2019-09-30 (×14): 4 [IU] via SUBCUTANEOUS

## 2019-09-25 MED ORDER — MORPHINE SULFATE (PF) 2 MG/ML IV SOLN: 2.0000 mg | INTRAVENOUS | Status: DC | PRN

## 2019-09-25 MED ORDER — INSULIN ASPART 100 UNIT/ML ~~LOC~~ SOLN
20.0000 [IU] | Freq: Once | SUBCUTANEOUS | Status: AC
  Administered 2019-09-25: 20 [IU] via SUBCUTANEOUS

## 2019-09-25 MED ORDER — TAMSULOSIN HCL 0.4 MG PO CAPS
0.4000 mg | ORAL_CAPSULE | Freq: Every day | ORAL | Status: DC
  Administered 2019-09-25 – 2019-09-30 (×6): 0.4 mg via ORAL
  Filled 2019-09-25 (×6): qty 1

## 2019-09-25 NOTE — Progress Notes (Signed)
Inpatient Diabetes Program Recommendations  AACE/ADA: New Consensus Statement on Inpatient Glycemic Control (2015)  Target Ranges:  Prepandial:   less than 140 mg/dL      Peak postprandial:   less than 180 mg/dL (1-2 hours)      Critically ill patients:  140 - 180 mg/dL   Lab Results  Component Value Date   GLUCAP 296 (H) 09/25/2019   HGBA1C 6.5 (H) 09/04/2019    Review of Glycemic Control Results for ASCENCION, COYE (MRN [id number]) as of 09/25/2019 14:18  Ref. Range 09/24/2019 17:24 09/24/2019 21:05 09/25/2019 08:25 09/25/2019 11:43  Glucose-Capillary Latest Ref Range: 70 - 99 mg/dL 335 (H) 311 (H) 150 (H) 296 (H)   Diabetes history: DM2 Outpatient Diabetes medications: None Current orders for Inpatient glycemic control: Novolog moderate correction tid + hs 0-5 units + Decadron 6 mg q 24 hrs.  Inpatient Diabetes Program Recommendations:   While on steroids: -Novolog 3 units tid meal coverage if eats 50%  Thank you, Bethena Roys E. Julio Storr, RN, MSN, CDE  Diabetes Coordinator Inpatient Glycemic Control Team Team Pager [phone] (8am-5pm) 09/25/2019 2:21 PM

## 2019-09-25 NOTE — TOC Progression Note (Signed)
Transition of Care [facility]) - Progression Note    Patient Details  Name: Taven Strite MRN: [id number] Date of Birth: March 15, 1941  Transition of [facility] CM/SW Contact  Loletha Grayer Beverely Pace, RN Phone Number: [phone] ( working remotely) 09/25/2019, 1:36 PM  Clinical Narrative:  Case manager received call from Beryle Beams,  [facility] Liaison, with update that the [facility] has stated they will not be accepting patient back for service. Per staff he has refused visits and is not appropriate for home. It appears they have filed an APS report concerning patient. Patient will need SNF placement.          Expected Discharge Plan and Services                                                 Social Determinants of Health (SDOH) Interventions    Readmission Risk Interventions No flowsheet data found.

## 2019-09-25 NOTE — Progress Notes (Signed)
PROGRESS NOTE  Zell Hylton UMP:[id number] DOB: 1941-06-28 DOA: 09/21/2019  PCP: [name], MD  Brief History/Interval Summary: 78 year old with a history of HTN, BPH status post TURP, COPD requiring 3 L home O2, and diastolic CHF who was discharged 9/28 after admission for hypoxic respiratory failure.  He returned to the ED with 2 to 3 days of progressively worsening lethargy with loose stools, and severe shortness of breath.  The patient's grandson found him with obvious shortness of breath clutching his chest and very somnolent.  In the [facility] ED he displayed increased work of breathing and was hypoxic requiring 10 L via Venturi mask to keep sats at 88 or better.  He was admitted initially with a diagnosis of an acute CHF exacerbation.  He was later found to be Covid positive.   Reason for Visit: Acute respiratory failure with hypoxia.  Pneumonia due to COVID-19  Consultants: None  Procedures: None  Antibiotics: Anti-infectives (From admission, onward)   Start     Dose/Rate Route Frequency Ordered Stop   09/23/19 1000  remdesivir 100 mg in sodium chloride 0.9 % 250 mL IVPB     100 mg 500 mL/hr over 30 Minutes Intravenous Every 24 hours 09/22/19 0936 09/27/19 0959   09/22/19 1030  remdesivir 200 mg in sodium chloride 0.9 % 250 mL IVPB     200 mg 500 mL/hr over 30 Minutes Intravenous Once 09/22/19 0936 09/22/19 1259      Subjective/Interval History: Patient states that he is feeling well.  Denies any shortness of breath or chest pain.  No nausea vomiting.  Looking forward to getting out of bed to chair today.      Assessment/Plan:  Acute Hypoxic Resp. Failure/Pneumonia due to COVID-19  COVID-19 Labs  Recent Labs    09/23/19 0511 09/24/19 0345 09/25/19 0415  DDIMER 2.25* 2.57* 1.95*  FERRITIN 1,133* 1,027*  --   CRP 19.5* 10.5*  --     Lab Results  Component Value Date   SARSCOV2NAA POSITIVE (A) 09/21/2019   SARSCOV2NAA NEGATIVE 09/03/2019   [place] NEGATIVE  07/03/2019     Fever: Remains afebrile Oxygen requirements: High flow nasal cannula.  Was on 13 L this morning.  Was dropped down to 10 L since his sats were greater than 94%.  Antibacterials: None Remdesivir: Day 4 Steroids: Dexamethasone 6 mg daily Diuretics: Not being given on a scheduled basis Actemra: Not given yet Convalescent plasma: Given on 10/12 Vitamin C and Zinc: Continue DVT Prophylaxis:  Lovenox 40 mg daily  Patient's respiratory status remains tenuous although he appears to be improving.  Dropped down his oxygen to 10 L/min this morning.  Notified nurse.  Continue to monitor closely.  His inflammatory markers were better yesterday.  Not checked today.  We will check them tomorrow.  D-dimer 1.95 today.  CT angiogram that was done recently was negative for PE.  Patient remains on Remdesivir and steroids.  He was also given convalescent plasma.  Actemra was not given as the patient did not want it.  Continue to mobilize.  PT and OT evaluation.    Gastroenteritis secondary to COVID-19 Apparently he had 7 days of frequent diarrhea.  Seems to have improved.  Continue to monitor closely.  Hyperkalemia Potassium was 5.6 yesterday.  Patient was given Lokelma.  Improved this morning.    Chronic diastolic CHF EF 65 to 40% per echocardiogram.  Does not appear to be volume overloaded.  Hypotension Noted to have borderline low blood pressures.  Blood pressure  now stable.  Could have been due to hypovolemia.  Continue to monitor.    History of COPD on chronic home oxygen Apparently on 3-4 L of oxygen by nasal cannula at home.  Stable currently.  Requiring high doses of oxygen as discussed above.  Mild transaminitis Has improved.  Possibly due to COVID-19.  Normocytic anemia No evidence of overt bleeding.  Hemoglobin is stable.  Hyperglycemia due to steroids/history of diabetes mellitus type 2 May need sliding scale coverage.  Apparently was not on any medications for same.   HbA1c 6.5 in September.  Acute kidney injury Most likely due to ATN in the setting of hypotension.  Improved.  Monitor urine output.  Acute urinary retention/history of BPH Apparently had significant urinary retention few days ago.  Foley was placed.  Patient does have a history of BPH but he is status post surgery.  Not noted to be on any medications for same at home.  He takes over-the-counter medications.  We will place him on Flomax for now.  Do a voiding trial when he is more mobile.    Suspicion for OSA Apparently heavy snoring noted.  Evaluated in pulmonology clinic 10/5.  Will need outpatient follow-up.  Obesity Estimated body mass index is 30.02 kg/m as calculated from the following:   Height as of this encounter: 5\' 11"  (1.803 m).   Weight as of this encounter: 97.6 kg.   DVT Prophylaxis: Lovenox PUD Prophylaxis: Pepcid Code Status: Full code Family Communication: Discussed with his son and wife yesterday. Disposition Plan: Mobilize.  Continue treatment for COVID-19.  Hopefully will be able to return home when ready for discharge.     Medications:  Scheduled: . Chlorhexidine Gluconate Cloth  6 each Topical Daily  . dexamethasone (DECADRON) injection  6 mg Intravenous Q24H  . enoxaparin (LOVENOX) injection  40 mg Subcutaneous Daily  . famotidine  20 mg Oral Daily  . insulin aspart  0-15 Units Subcutaneous TID WC  . insulin aspart  0-5 Units Subcutaneous QHS  . umeclidinium-vilanterol  1 puff Inhalation Daily  . vitamin C  500 mg Oral Daily  . zinc sulfate  220 mg Oral Daily   Continuous: . remdesivir 100 mg in NS 250 mL 100 mg (09/24/19 0916)   ZOX:[id number]:acetaminophen **OR** [DISCONTINUED] acetaminophen, albuterol, chlorpheniramine-HYDROcodone, guaiFENesin-dextromethorphan, morphine injection   Objective:  Vital Signs  Vitals:   09/25/19 0109 09/25/19 0500 09/25/19 0513 09/25/19 0800  BP: 123/84  114/80 117/81  Pulse: 84  86 95  Resp: 14  18   Temp: 98.5 F  (36.9 C)  97.9 F (36.6 C) 97.9 F (36.6 C)  TempSrc: Oral  Oral Oral  SpO2: 96%   91%  Weight:  97.6 kg    Height:        Intake/Output Summary (Last 24 hours) at 09/25/2019 1202 Last data filed at 09/25/2019 0513 Gross per 24 hour  Intake 720 ml  Output 1275 ml  Net -555 ml   Filed Weights   09/21/19 1522 09/25/19 0500  Weight: 101 kg 97.6 kg    General appearance: Awake alert.  In no distress Resp: Normal effort at rest.  Coarse breath sounds bilaterally.  Few crackles at the bases.  No wheezing or rhonchi  Cardio: S1-S2 is normal regular.  No S3-S4.  No rubs murmurs or bruit GI: Abdomen is soft.  Nontender nondistended.  Bowel sounds are present normal.  No masses organomegaly Extremities: No edema.  Full range of motion of lower extremities. Neurologic: Alert  and oriented x3.  No focal neurological deficits.     Lab Results:  Data Reviewed: I have personally reviewed following labs and imaging studies  CBC: Recent Labs  Lab 09/21/19 1543  09/22/19 0403 09/22/19 1030 09/23/19 0511 09/24/19 0345 09/25/19 0415  WBC 6.8  --  5.9 7.2 4.7 7.5 7.7  NEUTROABS 5.9  --   --  6.3 4.0 6.6 6.7  HGB 12.8*   < > 11.9* 11.7* 11.9* 11.4* 11.6*  HCT 41.3   < > 39.2 38.2* 38.9* 37.6* 39.1  MCV 93.4  --  93.1 93.4 93.5 93.3 94.7  PLT 180  --  172 183 219 258 274   < > = values in this interval not displayed.    Basic Metabolic Panel: Recent Labs  Lab 09/22/19 0403 09/22/19 1030 09/23/19 0511 09/24/19 0345 09/24/19 1505 09/25/19 0415  NA 137 137 140 141  --  143  K 4.3 4.5 4.7 5.6* 4.7 4.6  CL 98 98 99 102  --  102  CO2 29 30 29 30   --  32  GLUCOSE 121* 129* 194* 223*  --  162*  BUN 18 22 43* 53*  --  41*  CREATININE 1.21 1.42* 1.24 1.19  --  1.05  CALCIUM 7.7* 7.9* 8.4* 8.5*  --  8.7*  MG  --   --  2.1 2.3  --  2.1    GFR: Estimated Creatinine Clearance: 66.8 mL/min (by C-G formula based on SCr of 1.05 mg/dL).  Liver Function Tests: Recent Labs  Lab  09/21/19 1543 09/22/19 1030 09/23/19 0511 09/24/19 0345 09/25/19 0415  AST 78* 55* 39 31 52*  ALT 61* 51* 44 40 55*  ALKPHOS 48 44 47 48 51  BILITOT 0.3 0.8 0.4 0.3 0.3  PROT 5.6* 5.6* 5.7* 5.5* 5.4*  ALBUMIN 2.3* 2.4* 2.5* 2.2* 2.2*    Anemia Panel: Recent Labs    09/23/19 0511 09/24/19 0345  FERRITIN 1,133* 1,027*    Recent Results (from the past 240 hour(s))  SARS CORONAVIRUS 2 (TAT 6-24 HRS) Nasopharyngeal Nasopharyngeal Swab     Status: Abnormal   Collection Time: 09/21/19  3:43 PM   Specimen: Nasopharyngeal Swab  Result Value Ref Range Status   SARS Coronavirus 2 POSITIVE (A) NEGATIVE Final    Comment: RESULT CALLED TO, READ BACK BY AND VERIFIED WITH: S12/11/20 2147 09/21/2019 T. TYSOR (NOTE) SARS-CoV-2 target nucleic acids are DETECTED. The SARS-CoV-2 RNA is generally detectable in upper and lower respiratory specimens during the acute phase of infection. Positive results are indicative of active infection with SARS-CoV-2. Clinical  correlation with patient history and other diagnostic information is necessary to determine patient infection status. Positive results do  not rule out bacterial infection or co-infection with other viruses. The expected result is Negative. Fact Sheet for Patients: 11/21/2019 Fact Sheet for Healthcare Providers: HairSlick.no This test is not yet approved or cleared by the quierodirigir.com FDA and  has been authorized for detection and/or diagnosis of SARS-CoV-2 by FDA under an Emergency Use Authorization (EUA). This EUA will remain  in effect (meaning this test can be used) for  the duration of the COVID-19 declaration under Section 564(b)(1) of the Act, 21 U.S.C. section 360bbb-3(b)(1), unless the authorization is terminated or revoked sooner. Performed at [facility], [address] [place], [place]       Radiology Studies: No results found.      LOS: 4 days   [id number]  [facility] Pager on [email or web address]  09/25/2019, 12:02 PM

## 2019-09-25 NOTE — Evaluation (Signed)
Physical Therapy Evaluation Patient Details Name: Aaron Daniels MRN: [id number] DOB: March 06, 1941 Today's Date: 09/25/2019   History of Present Illness  78 y/o male recently d/c home on 09/28 after hospitalization w/ hypoxic respiratory failure. returned to ED w/ worsening SOB, admitted with acute CHF exacerbation then found to be COVID +  Clinical Impression   Pt re-admitted with above diagnosis. Pt with hx of a-fib which is limiting mobility currently. Pt currently with functional limitations due to the deficits listed below (see PT Problem List). Pt will benefit from skilled PT to increase his overall activity tolerance, strength, balance and coordination,  independence and safety with mobility to allow discharge to the venue listed below.       Follow Up Recommendations Home health PT    Equipment Recommendations  None recommended by PT    Recommendations for Other Services OT consult     Precautions / Restrictions Precautions Precautions: Fall;Other (comment) Precaution Comments: a-fib HR flutuates extremely w/ activity Restrictions Weight Bearing Restrictions: No      Mobility  Bed Mobility Overal bed mobility: Needs Assistance Bed Mobility: Supine to Sit     Supine to sit: Supervision     General bed mobility comments: needs line management  Transfers Overall transfer level: Needs assistance Equipment used: Rolling walker (2 wheeled) Transfers: Sit to/from [employer] Transfers Sit to Stand: Supervision Stand pivot transfers: Min guard       General transfer comment: sit<>stand HR increased to 160s, wa able to return to 120 whil standing, moved to recliner and while pt sitting in recliner at one time HR increased to 200s  Ambulation/Gait             General Gait Details: did not attempt ambulation this session sec to a-fib and fluctuating rates  Stairs            Wheelchair Mobility    Modified Rankin (Stroke Patients Only)        Balance Overall balance assessment: Needs assistance Sitting-balance support: Feet supported Sitting balance-Leahy Scale: Good     Standing balance support: During functional activity Standing balance-Leahy Scale: Fair                               Pertinent Vitals/Pain      Home Living Family/patient expects to be discharged to:: Private residence Living Arrangements: Spouse/significant other Available Help at Discharge: Family;Other (Comment) Type of Home: House Home Access: Stairs to enter   Entrance Stairs-Number of Steps: 2 Home Layout: One level Home Equipment: Bedside commode;Shower seat      Prior Function Level of Independence: Independent         Comments: 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     Hand Dominance   Dominant Hand: Right    Extremity/Trunk Assessment   Upper Extremity Assessment Upper Extremity Assessment: Defer to OT evaluation    Lower Extremity Assessment Lower Extremity Assessment: Overall WFL for tasks assessed       Communication   Communication: No difficulties  Cognition Arousal/Alertness: Awake/alert Behavior During Therapy: WFL for tasks assessed/performed Overall Cognitive Status: Within Functional Limits for tasks assessed                                 General Comments: very talkative      General Comments General comments (skin integrity, edema, etc.): Pt slightly  confused during assessment, he is oriented x 3. He is able to complete most mobility tasks with SBA and mod I but HR tendd to fluctuate and he has no awareness of any of his deficits. He needs close supervision and monitoring of vitals for safety. Pt is on 10L/min via HFNC but 02 sats remain in 90s thoughout session.    Exercises     Assessment/Plan    PT Assessment Patient needs continued PT services(while in hospital)  PT Problem List Decreased activity tolerance;Decreased balance;Decreased  mobility;Decreased coordination;Decreased safety awareness       PT Treatment Interventions Gait training;Functional mobility training;Therapeutic activities;Therapeutic exercise;Neuromuscular re-education;Patient/family education    PT Goals (Current goals can be found in the Care Plan section)  Acute Rehab PT Goals Time For Goal Achievement: 10/09/19 Potential to Achieve Goals: Fair    Frequency Min 3X/week   Barriers to discharge   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 with spouse, 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 : A Lot 6 Click Score: 16    End of Session Equipment Utilized During Treatment: Oxygen Activity Tolerance: Treatment limited secondary to medical complications (Comment) Patient left: in chair;with call bell/phone within reach;with chair alarm set Nurse Communication: Mobility status PT Visit Diagnosis: Muscle weakness (generalized) (M62.81);Other abnormalities of gait and mobility (R26.89)    Time: 8127-5170 PT Time Calculation (min) (ACUTE ONLY): 39 min   Charges:   PT Evaluation $PT Eval Moderate Complexity: 1 Mod PT Treatments $Therapeutic Activity: 8-22 mins $Self Care/Home Management: [place], PT    Delford Field 09/25/2019, 1:56 PM

## 2019-09-25 NOTE — Progress Notes (Signed)
Foley catheter removed accidentally. [place] Pink, MD aware. Orders to leave foley out and bladder scan pt Q6 hrs. If pt retaining urine and is unable to void, foley can be re-inserted. Will relay message to pm nurse. Will continue to monitor pt

## 2019-09-25 NOTE — Progress Notes (Signed)
Notified son, [name] of progress.  All questions were answered and this nurse's contact number shared for further communication.

## 2019-09-26 ENCOUNTER — Other Ambulatory Visit (HOSPITAL_COMMUNITY): Payer: Medicare Other

## 2019-09-26 DIAGNOSIS — R7401 Elevation of levels of liver transaminase levels: Secondary | ICD-10-CM

## 2019-09-26 LAB — CBC WITH DIFFERENTIAL/PLATELET
Abs Immature Granulocytes: 0.21 10*3/uL — ABNORMAL HIGH (ref 0.00–0.07)
Basophils Absolute: 0 10*3/uL (ref 0.0–0.1)
Basophils Relative: 0 %
Eosinophils Absolute: 0 10*3/uL (ref 0.0–0.5)
Eosinophils Relative: 0 %
HCT: 39.6 % (ref 39.0–52.0)
Hemoglobin: 12.1 g/dL — ABNORMAL LOW (ref 13.0–17.0)
Immature Granulocytes: 3 %
Lymphocytes Relative: 8 %
Lymphs Abs: 0.6 10*3/uL — ABNORMAL LOW (ref 0.7–4.0)
MCH: 28.7 pg (ref 26.0–34.0)
MCHC: 30.6 g/dL (ref 30.0–36.0)
MCV: 94.1 fL (ref 80.0–100.0)
Monocytes Absolute: 0.5 10*3/uL (ref 0.1–1.0)
Monocytes Relative: 7 %
Neutro Abs: 5.9 10*3/uL (ref 1.7–7.7)
Neutrophils Relative %: 82 %
Platelets: 289 10*3/uL (ref 150–400)
RBC: 4.21 MIL/uL — ABNORMAL LOW (ref 4.22–5.81)
RDW: 15.3 % (ref 11.5–15.5)
WBC: 7.2 10*3/uL (ref 4.0–10.5)
nRBC: 0 % (ref 0.0–0.2)

## 2019-09-26 LAB — COMPREHENSIVE METABOLIC PANEL
ALT: 184 U/L — ABNORMAL HIGH (ref 0–44)
AST: 172 U/L — ABNORMAL HIGH (ref 15–41)
Albumin: 2.1 g/dL — ABNORMAL LOW (ref 3.5–5.0)
Alkaline Phosphatase: 57 U/L (ref 38–126)
Anion gap: 9 (ref 5–15)
BUN: 39 mg/dL — ABNORMAL HIGH (ref 8–23)
CO2: 30 mmol/L (ref 22–32)
Calcium: 8.7 mg/dL — ABNORMAL LOW (ref 8.9–10.3)
Chloride: 100 mmol/L (ref 98–111)
Creatinine, Ser: 1.04 mg/dL (ref 0.61–1.24)
GFR calc Af Amer: >60 mL/min (ref >60)
GFR calc non Af Amer: >60 mL/min (ref >60)
Glucose, Bld: 240 mg/dL — ABNORMAL HIGH (ref 70–99)
Potassium: 4.5 mmol/L (ref 3.5–5.1)
Sodium: 139 mmol/L (ref 135–145)
Total Bilirubin: 0.3 mg/dL (ref 0.3–1.2)
Total Protein: 5.1 g/dL — ABNORMAL LOW (ref 6.5–8.1)

## 2019-09-26 LAB — GLUCOSE, CAPILLARY
Glucose-Capillary: 176 mg/dL — ABNORMAL HIGH (ref 70–99)
Glucose-Capillary: 204 mg/dL — ABNORMAL HIGH (ref 70–99)
Glucose-Capillary: 187 mg/dL — ABNORMAL HIGH (ref 70–99)
Glucose-Capillary: 290 mg/dL — ABNORMAL HIGH (ref 70–99)

## 2019-09-26 LAB — FERRITIN: Ferritin: 654 ng/mL — ABNORMAL HIGH (ref 24–336)

## 2019-09-26 LAB — MAGNESIUM: Magnesium: 2 mg/dL (ref 1.7–2.4)

## 2019-09-26 LAB — C-REACTIVE PROTEIN: CRP: 3.8 mg/dL — ABNORMAL HIGH (ref <1.0)

## 2019-09-26 LAB — D-DIMER, QUANTITATIVE: D-Dimer, Quant: 1.73 ug/mL-FEU — ABNORMAL HIGH (ref 0.00–0.50)

## 2019-09-26 NOTE — Progress Notes (Signed)
[name] updated

## 2019-09-26 NOTE — Progress Notes (Addendum)
PROGRESS NOTE  Aaron Daniels HUT:[id number] DOB: Apr 25, 1941 DOA: 09/21/2019  PCP: Albertha Ghee, MD  Brief History/Interval Summary: 78 year old with a history of HTN, BPH status post TURP, COPD requiring 3 L home O2, and diastolic CHF who was discharged 9/28 after admission for hypoxic respiratory failure.  He returned to the ED with 2 to 3 days of progressively worsening lethargy with loose stools, and severe shortness of breath.  The patient's grandson found him with obvious shortness of breath clutching his chest and very somnolent.  In the [facility] ED he displayed increased work of breathing and was hypoxic requiring 10 L via Venturi mask to keep sats at 88 or better.  He was admitted initially with a diagnosis of an acute CHF exacerbation.  He was later found to be Covid positive.   Reason for Visit: Acute respiratory failure with hypoxia.  Pneumonia due to COVID-19  Consultants: None  Procedures: None  Antibiotics: Anti-infectives (From admission, onward)   Start     Dose/Rate Route Frequency Ordered Stop   09/23/19 1000  remdesivir 100 mg in sodium chloride 0.9 % 250 mL IVPB     100 mg 500 mL/hr over 30 Minutes Intravenous Every 24 hours 09/22/19 0936 09/27/19 0959   09/22/19 1030  remdesivir 200 mg in sodium chloride 0.9 % 250 mL IVPB     200 mg 500 mL/hr over 30 Minutes Intravenous Once 09/22/19 0936 09/22/19 1259      Subjective/Interval History: Patient states that he is continuing to improve.  Not as short of breath as before.  Denies nausea vomiting.       Assessment/Plan:  Acute Hypoxic Resp. Failure/Pneumonia due to COVID-19  COVID-19 Labs  Recent Labs    09/24/19 0345 09/25/19 0415 09/26/19 0339  DDIMER 2.57* 1.95* 1.73*  FERRITIN 1,027* 815* 654*  CRP 10.5* 6.1* 3.8*    Lab Results  Component Value Date   SARSCOV2NAA POSITIVE (A) 09/21/2019   SARSCOV2NAA NEGATIVE 09/03/2019   SARSCOV2NAA NEGATIVE 07/03/2019     Fever: Afebrile Oxygen  requirements: High flow nasal cannula.  Down to 4 L/min.  Saturating in the early 90s.   Antibacterials: None Remdesivir: Day 5 Steroids: Dexamethasone 6 mg daily Diuretics: Not being given on a scheduled basis Actemra: Not given yet Convalescent plasma: Given on 10/12 Vitamin C and Zinc: Continue DVT Prophylaxis:  Lovenox 40 mg daily  Patient's respiratory status has improved.  He is down to 4 L of oxygen by high flow nasal cannula.  Saturating in the early 90s.  His inflammatory markers have improved.  D-dimer 1.73.  Feels better.  He will complete course of Remdesivir today.  He has received convalescent plasma.  Continue with steroids.  Continue to mobilize.   Acute urinary retention/history of BPH Apparently had significant urinary retention few days ago.  Foley was placed.  Patient does have a history of BPH but he is status post surgery.  Not noted to be on any medications for same at home.  He takes over-the-counter medications.  He was placed on Flomax yesterday.  Foley catheter was accidentally removed yesterday by nurse.  Patient has been able to void without difficulty.  Bladder scan as needed.    Hyperglycemia due to steroids/history of diabetes mellitus type 2 Not on any glucose lowering agents at home.  Elevated CBGs due to steroids.  Continue SSI and Lantus.  HbA1c was 6.5 in September.    Transaminitis LFTs noted to be higher today compared to yesterday.  Could be  due to Remdesivir.  We will recheck levels tomorrow.  Acute kidney injury Most likely due to ATN in the setting of hypotension.  Improved.  Monitor urine output.  Gastroenteritis secondary to COVID-19 Apparently he had 7 days of frequent diarrhea.  Seems to have improved.  Continue to monitor closely.  Hyperkalemia This has resolved with Lokelma.    Chronic diastolic CHF EF 65 to 83% per echocardiogram.  Does not appear to be volume overloaded.  Hypotension Due to hypovolemia.  Now resolved.    History  of COPD on chronic home oxygen Apparently on 3-4 L of oxygen by nasal cannula at home.  Oxygenation has improved.  Now close to his usual baseline requirement.    Normocytic anemia No evidence of overt bleeding.  Hemoglobin is stable.  Suspicion for OSA Evaluated in pulmonology clinic 10/5.  Will need continued outpatient follow-up.  Obesity Estimated body mass index is 30.02 kg/m as calculated from the following:   Height as of this encounter: 5\' 11"  (1.803 m).   Weight as of this encounter: 97.6 kg.   DVT Prophylaxis: Lovenox PUD Prophylaxis: Pepcid Code Status: Full code Family Communication: Discussed with son and wife on a daily basis. Disposition Plan: Continue to mobilize.  Hopefully home when ready for discharge.      Medications:  Scheduled: . Chlorhexidine Gluconate Cloth  6 each Topical Daily  . dexamethasone (DECADRON) injection  6 mg Intravenous Q24H  . enoxaparin (LOVENOX) injection  40 mg Subcutaneous Daily  . famotidine  20 mg Oral Daily  . insulin aspart  0-15 Units Subcutaneous TID WC  . insulin aspart  0-5 Units Subcutaneous QHS  . insulin aspart  4 Units Subcutaneous TID WC  . insulin glargine  15 Units Subcutaneous Daily  . tamsulosin  0.4 mg Oral Daily  . umeclidinium-vilanterol  1 puff Inhalation Daily  . vitamin C  500 mg Oral Daily  . zinc sulfate  220 mg Oral Daily   Continuous: . remdesivir 100 mg in NS 250 mL 100 mg (09/26/19 1047)   TDV:VOHYWVPXTGGYI **OR** [DISCONTINUED] acetaminophen, albuterol, chlorpheniramine-HYDROcodone, guaiFENesin-dextromethorphan, morphine injection   Objective:  Vital Signs  Vitals:   09/26/19 0000 09/26/19 0200 09/26/19 0440 09/26/19 0700  BP: 119/85   124/80  Pulse: 100     Resp: 20  20   Temp: 98.3 F (36.8 C)  97.7 F (36.5 C) 97.9 F (36.6 C)  TempSrc: Oral  Oral Oral  SpO2: 93% 92% 93%   Weight:      Height:        Intake/Output Summary (Last 24 hours) at 09/26/2019 1047 Last data filed at  09/26/2019 0300 Gross per 24 hour  Intake 1475.5 ml  Output 725 ml  Net 750.5 ml   Filed Weights   09/21/19 1522 09/25/19 0500  Weight: 101 kg 97.6 kg    General appearance: Awake alert.  In no distress mildly distracted Resp: Normal effort at rest.  Coarse breath sound bilaterally.  Few crackles at the bases.  No wheezing or rhonchi.  Cardio: S1-S2 is normal regular.  No S3-S4.  No rubs murmurs or bruit GI: Abdomen is soft.  Nontender nondistended.  Bowel sounds are present normal.  No masses organomegaly Extremities: No edema.  Full range of motion of lower extremities. Neurologic: No focal neurological deficits.     Lab Results:  Data Reviewed: I have personally reviewed following labs and imaging studies  CBC: Recent Labs  Lab 09/22/19 1030 09/23/19 0511 09/24/19 0345 09/25/19  [id number] 09/26/19 0339  WBC 7.2 4.7 7.5 7.7 7.2  NEUTROABS 6.3 4.0 6.6 6.7 5.9  HGB 11.7* 11.9* 11.4* 11.6* 12.1*  HCT 38.2* 38.9* 37.6* 39.1 39.6  MCV 93.4 93.5 93.3 94.7 94.1  PLT 183 219 258 274 289    Basic Metabolic Panel: Recent Labs  Lab 09/22/19 1030 09/23/19 0511 09/24/19 0345 09/24/19 1505 09/25/19 0415 09/26/19 0339  NA 137 140 141  --  143 139  K 4.5 4.7 5.6* 4.7 4.6 4.5  CL 98 99 102  --  102 100  CO2 30 29 30   --  32 30  GLUCOSE 129* 194* 223*  --  162* 240*  BUN 22 43* 53*  --  41* 39*  CREATININE 1.42* 1.24 1.19  --  1.05 1.04  CALCIUM 7.9* 8.4* 8.5*  --  8.7* 8.7*  MG  --  2.1 2.3  --  2.1 2.0    GFR: Estimated Creatinine Clearance: 67.5 mL/min (by C-G formula based on SCr of 1.04 mg/dL).  Liver Function Tests: Recent Labs  Lab 09/22/19 1030 09/23/19 0511 09/24/19 0345 09/25/19 0415 09/26/19 0339  AST 55* 39 31 52* 172*  ALT 51* 44 40 55* 184*  ALKPHOS 44 47 48 51 57  BILITOT 0.8 0.4 0.3 0.3 0.3  PROT 5.6* 5.7* 5.5* 5.4* 5.1*  ALBUMIN 2.4* 2.5* 2.2* 2.2* 2.1*    Anemia Panel: Recent Labs    09/25/19 0415 09/26/19 0339  FERRITIN 815* 654*     Recent Results (from the past 240 hour(s))  SARS CORONAVIRUS 2 (TAT 6-24 HRS) Nasopharyngeal Nasopharyngeal Swab     Status: Abnormal   Collection Time: 09/21/19  3:43 PM   Specimen: Nasopharyngeal Swab  Result Value Ref Range Status   SARS Coronavirus 2 POSITIVE (A) NEGATIVE Final    Comment: RESULT CALLED TO, READ BACK BY AND VERIFIED WITH: SRonny Flurry. WILSON,RN 2147 09/21/2019 T. TYSOR (NOTE) SARS-CoV-2 target nucleic acids are DETECTED. The SARS-CoV-2 RNA is generally detectable in upper and lower respiratory specimens during the acute phase of infection. Positive results are indicative of active infection with SARS-CoV-2. Clinical  correlation with patient history and other diagnostic information is necessary to determine patient infection status. Positive results do  not rule out bacterial infection or co-infection with other viruses. The expected result is Negative. Fact Sheet for Patients: HairSlick.nohttps://www.fda.gov/media/138098/download Fact Sheet for Healthcare Providers: quierodirigir.comhttps://www.fda.gov/media/138095/download This test is not yet approved or cleared by the [place] FDA and  has been authorized for detection and/or diagnosis of SARS-CoV-2 by FDA under an Emergency Use Authorization (EUA). This EUA will remain  in effect (meaning this test can be used) for  the duration of the COVID-19 declaration under Section 564(b)(1) of the Act, 21 U.S.C. section 360bbb-3(b)(1), unless the authorization is terminated or revoked sooner. Performed at [facility], [address] [place], [place] [address]       Radiology Studies: No results found.     LOS: 5 days   Arlon Bleier [employer]Krishnan  [facility] Pager on [email or web address]  09/26/2019, 10:47 AM

## 2019-09-26 NOTE — TOC Progression Note (Signed)
Transition of Care [facility]) - Progression Note    Patient Details  Name: Aaron Daniels MRN: [id number] Date of Birth: [birth year]  Transition of Care [facility]) CM/SW Contact  Loletha Grayer Beverely Pace, RN Phone Number: [phone] (working remotely) 09/26/2019, 9:42 AM  Clinical Narrative:  Case manager received call from Adela Lank with [facility]. The supervisor with [facility] says that they WILL provide/resume patient's Home Health services at discharge. Should he need Palliative services they will initiate from the home. Case manager updated attending. Patient will not discharge for another couple of days. Case manager will continue to monitor.           Expected Discharge Plan and Services                                                 Social Determinants of Health (SDOH) Interventions    Readmission Risk Interventions No flowsheet data found.

## 2019-09-26 NOTE — Progress Notes (Signed)
Patient able to void on  BSC, no issues reported.

## 2019-09-26 NOTE — Progress Notes (Signed)
CSW spoke with [facility] APS caseworker Rockwell [place] [phone] to follow up with discharge plans. [facility] Gu[facility] states they received report due to hoarding at home and not taking care of self. Wells Guiles has been in touch with patient and states he is currently refusing services their services/ has refused Center services in the past- she states patient is of sound mind and is able to make his own decisions at this time. [facility] Guiles will attempt to follow up with patient once he discharges but is not able to continue is he refuses.   Kingsley Spittle, LCSW [facility] [place]  [phone]

## 2019-09-26 NOTE — Progress Notes (Signed)
Occupational Therapy Evaluation Patient Details Name: Aaron Daniels MRN: [id number] DOB: 09-23-1941 Today's Date: 09/26/2019    History of Present Illness 78 y/o male recently d/c home on 09/28 after hospitalization w/ hypoxic respiratory failure. returned to ED w/ worsening SOB, admitted with acute CHF exacerbation then found to be COVID +   Clinical Impression   PTA, pt lived at home with his wife, who he helps care for, and was independent with ADL and mobility. Pt unsteady with gait and feels more secure with use of RW. Minguard A after set up with ADL. HR increased to 160s, however feel it was most likely artifact. Called central tele to assess vitals. However, pt desats to 82-84 with mobility on RA. SpO2 90 on 2L during mobility in room. Recommend DC home with [place]. Pt states hhis son can assist after DC.     Follow Up Recommendations  Home health OT;Supervision - Intermittent    Equipment Recommendations  None recommended by OT    Recommendations for Other Services       Precautions / Restrictions Precautions Precautions: Fall;Other (comment) Precaution Comments: a-fib HR flutuates extremely w/ activity; recommend leaving on central tele monitor for mobility to further assess Restrictions Weight Bearing Restrictions: No      Mobility Bed Mobility               General bed mobility comments: OOB in chair  Transfers Overall transfer level: Needs assistance Equipment used: Rolling walker (2 wheeled) Transfers: Sit to/from [employer] Transfers Sit to Stand: Supervision Stand pivot transfers: Min guard            Balance Overall balance assessment: Needs assistance Sitting-balance support: Feet supported Sitting balance-Leahy Scale: Good     Standing balance support: During functional activity Standing balance-Leahy Scale: Fair                             ADL either performed or assessed with clinical judgement   ADL        Grooming: Set up;Sitting   Upper Body Bathing: Set up;Sitting   Lower Body Bathing: Min guard;Sit to/from stand       Lower Body Dressing: Minimal assistance;Sit to/from stand   Toilet Transfer: Minimal assistance;RW;Ambulation;BSC   Toileting- Clothing Manipulation and Hygiene: Supervision/safety       Functional mobility during ADLs: Minimal assistance;Rolling walker General ADL Comments: limited by HR     Vision         Perception     Praxis      Pertinent Vitals/Pain Pain Assessment: Faces Faces Pain Scale: Hurts a little bit Pain Location: general discomfort Pain Descriptors / Indicators: Discomfort Pain Intervention(s): Limited activity within patient's tolerance     Hand Dominance Right   Extremity/Trunk Assessment Upper Extremity Assessment Upper Extremity Assessment: Generalized weakness   Lower Extremity Assessment Lower Extremity Assessment: Defer to PT evaluation   Cervical / Trunk Assessment Cervical / Trunk Assessment: Normal   Communication Communication Communication: No difficulties   Cognition Arousal/Alertness: Awake/alert Behavior During Therapy: WFL for tasks assessed/performed Overall Cognitive Status: No family/caregiver present to determine baseline cognitive functioning                                 General Comments: most likely baseline   General Comments       Exercises Exercises: Other exercises Total Joint Exercises Marching in Standing:  10 reps General Exercises - Lower Extremity Mini-Sqauts: 10 reps Other Exercises Other Exercises: incentive spirometer x 10. able to pull Other Exercises: BUE theraband strengthening   Shoulder Instructions      Home Living Family/patient expects to be discharged to:: Private residence Living Arrangements: Spouse/significant other Available Help at Discharge: Family;Other (Comment) Type of Home: House Home Access: Stairs to enter [employer]  of Steps: 2   Home Layout: One level     Bathroom Shower/Tub: Chief Strategy Officer: Standard Bathroom Accessibility: No   Home Equipment: Bedside commode;Shower seat;Walker - 2 wheels          Prior Functioning/Environment Level of Independence: Independent        Comments: 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        OT Problem List: Decreased activity tolerance;Decreased strength;Impaired balance (sitting and/or standing);Decreased safety awareness;Decreased knowledge of use of DME or AE;Cardiopulmonary status limiting activity      OT Treatment/Interventions: Self-care/ADL training;Therapeutic exercise;Neuromuscular education;Energy conservation;DME and/or AE instruction;Therapeutic activities;Patient/family education;Balance training    OT Goals(Current goals can be found in the care plan section) Acute Rehab OT Goals Patient Stated Goal: to go home and be with wife and dog OT Goal Formulation: With patient Time For Goal Achievement: 10/10/19 Potential to Achieve Goals: Good  OT Frequency: Min 3X/week   Barriers to D/C:            Co-evaluation              AM-PAC OT "6 Clicks" Daily Activity     Outcome Measure Help from another person eating meals?: None Help from another person taking care of personal grooming?: A Little Help from another person toileting, which includes using toliet, bedpan, or urinal?: A Little Help from another person bathing (including washing, rinsing, drying)?: A Little Help from another person to put on and taking off regular upper body clothing?: A Little Help from another person to put on and taking off regular lower body clothing?: A Little 6 Click Score: 19   End of Session Equipment Utilized During Treatment: Oxygen Nurse Communication: Mobility status;Other (comment)(HR)  Activity Tolerance: Patient tolerated treatment well Patient left: in chair;with call bell/phone within  reach;with chair alarm set  OT Visit Diagnosis: Unsteadiness on feet (R26.81);Muscle weakness (generalized) (M62.81)                Time: 2778-2423 OT Time Calculation (min): 37 min Charges:  OT General Charges $OT Visit: 1 Visit OT Evaluation $OT Eval Moderate Complexity: 1 Mod OT Treatments $Self Care/Home Management : 8-22 mins  Luisa Dago, OT/L   Acute OT Clinical Specialist Acute Rehabilitation Services Pager [phone] Office [phone]   [facility] 09/26/2019, 4:49 PM

## 2019-09-26 NOTE — Progress Notes (Signed)
Able to wean oxygen to 4L HFNC, sats remain 89-91%

## 2019-09-27 LAB — CBC WITH DIFFERENTIAL/PLATELET
Abs Immature Granulocytes: 0.2 10*3/uL — ABNORMAL HIGH (ref 0.00–0.07)
Basophils Absolute: 0 10*3/uL (ref 0.0–0.1)
Basophils Relative: 0 %
Eosinophils Absolute: 0 10*3/uL (ref 0.0–0.5)
Eosinophils Relative: 0 %
HCT: 38.8 % — ABNORMAL LOW (ref 39.0–52.0)
Hemoglobin: 12.2 g/dL — ABNORMAL LOW (ref 13.0–17.0)
Immature Granulocytes: 4 %
Lymphocytes Relative: 11 %
Lymphs Abs: 0.6 10*3/uL — ABNORMAL LOW (ref 0.7–4.0)
MCH: 28.6 pg (ref 26.0–34.0)
MCHC: 31.4 g/dL (ref 30.0–36.0)
MCV: 90.9 fL (ref 80.0–100.0)
Monocytes Absolute: 0.3 10*3/uL (ref 0.1–1.0)
Monocytes Relative: 6 %
Neutro Abs: 4.3 10*3/uL (ref 1.7–7.7)
Neutrophils Relative %: 79 %
Platelets: 313 10*3/uL (ref 150–400)
RBC: 4.27 MIL/uL (ref 4.22–5.81)
RDW: 15.5 % (ref 11.5–15.5)
WBC: 5.5 10*3/uL (ref 4.0–10.5)
nRBC: 0 % (ref 0.0–0.2)

## 2019-09-27 LAB — FERRITIN: Ferritin: 510 ng/mL — ABNORMAL HIGH (ref 24–336)

## 2019-09-27 LAB — GLUCOSE, CAPILLARY
Glucose-Capillary: 186 mg/dL — ABNORMAL HIGH (ref 70–99)
Glucose-Capillary: 369 mg/dL — ABNORMAL HIGH (ref 70–99)
Glucose-Capillary: 233 mg/dL — ABNORMAL HIGH (ref 70–99)

## 2019-09-27 LAB — COMPREHENSIVE METABOLIC PANEL
ALT: 129 U/L — ABNORMAL HIGH (ref 0–44)
AST: 55 U/L — ABNORMAL HIGH (ref 15–41)
Albumin: 2.4 g/dL — ABNORMAL LOW (ref 3.5–5.0)
Alkaline Phosphatase: 60 U/L (ref 38–126)
Anion gap: 10 (ref 5–15)
BUN: 33 mg/dL — ABNORMAL HIGH (ref 8–23)
CO2: 29 mmol/L (ref 22–32)
Calcium: 8.4 mg/dL — ABNORMAL LOW (ref 8.9–10.3)
Chloride: 100 mmol/L (ref 98–111)
Creatinine, Ser: 0.92 mg/dL (ref 0.61–1.24)
GFR calc Af Amer: >60 mL/min (ref >60)
GFR calc non Af Amer: >60 mL/min (ref >60)
Glucose, Bld: 155 mg/dL — ABNORMAL HIGH (ref 70–99)
Potassium: 4.5 mmol/L (ref 3.5–5.1)
Sodium: 139 mmol/L (ref 135–145)
Total Bilirubin: 0.4 mg/dL (ref 0.3–1.2)
Total Protein: 5.3 g/dL — ABNORMAL LOW (ref 6.5–8.1)

## 2019-09-27 LAB — D-DIMER, QUANTITATIVE: D-Dimer, Quant: 1.8 ug/mL-FEU — ABNORMAL HIGH (ref 0.00–0.50)

## 2019-09-27 LAB — MAGNESIUM: Magnesium: 2 mg/dL (ref 1.7–2.4)

## 2019-09-27 LAB — C-REACTIVE PROTEIN: CRP: 2.5 mg/dL — ABNORMAL HIGH (ref <1.0)

## 2019-09-27 NOTE — Progress Notes (Signed)
PROGRESS NOTE  Aaron Daniels IWP:[id number] DOB: Oct 18, 1941 DOA: 09/21/2019  PCP: Albertha Ghee, MD  Brief History/Interval Summary: 78 year old with a history of HTN, BPH status post TURP, COPD requiring 3 L home O2, and diastolic CHF who was discharged 9/28 after admission for hypoxic respiratory failure.  He returned to the ED with 2 to 3 days of progressively worsening lethargy with loose stools, and severe shortness of breath.  The patient's grandson found him with obvious shortness of breath clutching his chest and very somnolent.  In the [facility] ED he displayed increased work of breathing and was hypoxic requiring 10 L via Venturi mask to keep sats at 88 or better.  He was admitted initially with a diagnosis of an acute CHF exacerbation.  He was later found to be Covid positive.   Reason for Visit: Acute respiratory failure with hypoxia.  Pneumonia due to COVID-19  Consultants: None  Procedures: None  Antibiotics: Anti-infectives (From admission, onward)   Start     Dose/Rate Route Frequency Ordered Stop   09/23/19 1000  remdesivir 100 mg in sodium chloride 0.9 % 250 mL IVPB     100 mg 500 mL/hr over 30 Minutes Intravenous Every 24 hours 09/22/19 0936 09/26/19 1117   09/22/19 1030  remdesivir 200 mg in sodium chloride 0.9 % 250 mL IVPB     200 mg 500 mL/hr over 30 Minutes Intravenous Once 09/22/19 0936 09/22/19 1259      Subjective/Interval History: Patient states that he continues to feel better.  Not as short of breath as before.  No nausea vomiting.  Urinating well.      Assessment/Plan:  Acute Hypoxic Resp. Failure/Pneumonia due to COVID-19  COVID-19 Labs  Recent Labs    09/25/19 0415 09/26/19 0339 09/27/19 0337  DDIMER 1.95* 1.73* 1.80*  FERRITIN 815* 654* 510*  CRP 6.1* 3.8* 2.5*    Lab Results  Component Value Date   SARSCOV2NAA POSITIVE (A) 09/21/2019   SARSCOV2NAA NEGATIVE 09/03/2019   SARSCOV2NAA NEGATIVE 07/03/2019     Fever: Remains afebrile  Oxygen requirements: Nasal cannula at 2 to 3 L/min.  Saturating in the early 90s.   Antibacterials: None Remdesivir: Completed course of remdesivir on 10/16 Steroids: Dexamethasone 6 mg daily Diuretics: Not being given on a scheduled basis Actemra: Not given yet Convalescent plasma: Given on 10/12 Vitamin C and Zinc: Continue DVT Prophylaxis:  Lovenox 40 mg daily  Patient's respiratory status continues to improve.  He has completed course of remdesivir.  He remains on steroids.  Inflammatory markers are improving.  D-dimer is 1.8.  His oxygen requirements have decreased.  He also received convalescent plasma.  Continue to mobilize.  Anticipate discharge in the next 24 to 48 hours.   Acute urinary retention/history of BPH Apparently had significant urinary retention few days ago.  Foley was placed.  Patient does have a history of BPH but he is status post surgery.  Not noted to be on any medications for same at home.  He takes over-the-counter medications.  He was placed on Flomax.  Foley catheter was accidentally removed by nurse.  Patient has been able to void.  Bladder scan as needed.   Hyperglycemia due to steroids/history of diabetes mellitus type 2 Not on any glucose lowering agents at home.  Elevated CBGs due to steroids.  Continue SSI and Lantus.  HbA1c was 6.5 in September.    Transaminitis Transaminitis is due to COVID-19 and Remdesivir.  Stable today.    Acute kidney injury Most likely  due to ATN in the setting of hypotension.  Improved.  Monitor urine output.  Gastroenteritis secondary to COVID-19 Apparently he had 7 days of frequent diarrhea.  Seems to have improved.  Continue to monitor closely.  Hyperkalemia This has resolved with Lokelma.    Chronic diastolic CHF EF 65 to 67% per echocardiogram.  Does not appear to be volume overloaded.  Hypotension Due to hypovolemia.  Now resolved.    History of COPD on chronic home oxygen Apparently on 3-4 L of oxygen by nasal  cannula at home.  Oxygenation has improved.  Now close to his usual baseline requirement.    Normocytic anemia No evidence of overt bleeding.  Hemoglobin is stable.  Suspicion for OSA Evaluated in pulmonology clinic 10/5.  Will need continued outpatient follow-up.  Obesity Estimated body mass index is 30.4 kg/m as calculated from the following:   Height as of this encounter: 5\' 11"  (1.803 m).   Weight as of this encounter: 98.9 kg.   DVT Prophylaxis: Lovenox PUD Prophylaxis: Pepcid Code Status: Full code Family Communication: Discussed with patient.  Will call his family again today. Disposition Plan: Anticipate discharge in 24 to 48 hours.  He will likely go back home with home health.   Medications:  Scheduled: . Chlorhexidine Gluconate Cloth  6 each Topical Daily  . dexamethasone (DECADRON) injection  6 mg Intravenous Q24H  . enoxaparin (LOVENOX) injection  40 mg Subcutaneous Daily  . famotidine  20 mg Oral Daily  . insulin aspart  0-15 Units Subcutaneous TID WC  . insulin aspart  0-5 Units Subcutaneous QHS  . insulin aspart  4 Units Subcutaneous TID WC  . insulin glargine  15 Units Subcutaneous Daily  . tamsulosin  0.4 mg Oral Daily  . umeclidinium-vilanterol  1 puff Inhalation Daily  . vitamin C  500 mg Oral Daily  . zinc sulfate  220 mg Oral Daily   Continuous:  HAL:[id number] **OR** [DISCONTINUED] acetaminophen, albuterol, chlorpheniramine-HYDROcodone, guaiFENesin-dextromethorphan, morphine injection   Objective:  Vital Signs  Vitals:   09/26/19 2030 09/26/19 2350 09/27/19 0453 09/27/19 1000  BP: (!) 137/97 (!) 133/92  127/90  Pulse: 83 96  (!) 103  Resp:    18  Temp: 98.1 F (36.7 C) 98.1 F (36.7 C)    TempSrc: Oral Oral    SpO2: 92% 91%  94%  Weight:   98.9 kg   Height:       No intake or output data in the 24 hours ending 09/27/19 1053 Filed Weights   09/21/19 1522 09/25/19 0500 09/27/19 0453  Weight: 101 kg 97.6 kg 98.9 kg    General  appearance: Awake alert.  In no distress.  Mildly distracted Resp: Normal effort at rest.  Coarse breath sounds bilaterally.  Few crackles at the bases.  No wheezing or rhonchi.   Cardio: S1-S2 is normal regular.  No S3-S4.  No rubs murmurs or bruit GI: Abdomen is soft.  Nontender nondistended.  Bowel sounds are present normal.  No masses organomegaly Extremities: No edema.  Moving all his extremities Neurologic:  No focal neurological deficits.     Lab Results:  Data Reviewed: I have personally reviewed following labs and imaging studies  CBC: Recent Labs  Lab 09/23/19 0511 09/24/19 0345 09/25/19 0415 09/26/19 0339 09/27/19 0337  WBC 4.7 7.5 7.7 7.2 5.5  NEUTROABS 4.0 6.6 6.7 5.9 4.3  HGB 11.9* 11.4* 11.6* 12.1* 12.2*  HCT 38.9* 37.6* 39.1 39.6 38.8*  MCV 93.5 93.3 94.7 94.1 90.9  PLT 219 258 274 289 313    Basic Metabolic Panel: Recent Labs  Lab 09/23/19 0511 09/24/19 0345 09/24/19 1505 09/25/19 0415 09/26/19 0339 09/27/19 0337  NA 140 141  --  143 139 139  K 4.7 5.6* 4.7 4.6 4.5 4.5  CL 99 102  --  102 100 100  CO2 29 30  --  32 30 29  GLUCOSE 194* 223*  --  162* 240* 155*  BUN 43* 53*  --  41* 39* 33*  CREATININE 1.24 1.19  --  1.05 1.04 0.92  CALCIUM 8.4* 8.5*  --  8.7* 8.7* 8.4*  MG 2.1 2.3  --  2.1 2.0 2.0    GFR: Estimated Creatinine Clearance: 76.7 mL/min (by C-G formula based on SCr of 0.92 mg/dL).  Liver Function Tests: Recent Labs  Lab 09/23/19 0511 09/24/19 0345 09/25/19 0415 09/26/19 0339 09/27/19 0337  AST 39 31 52* 172* 55*  ALT 44 40 55* 184* 129*  ALKPHOS 47 48 51 57 60  BILITOT 0.4 0.3 0.3 0.3 0.4  PROT 5.7* 5.5* 5.4* 5.1* 5.3*  ALBUMIN 2.5* 2.2* 2.2* 2.1* 2.4*    Anemia Panel: Recent Labs    09/26/19 0339 09/27/19 0337  FERRITIN 654* 510*    Recent Results (from the past 240 hour(s))  SARS CORONAVIRUS 2 (TAT 6-24 HRS) Nasopharyngeal Nasopharyngeal Swab     Status: Abnormal   Collection Time: 09/21/19  3:43 PM    Specimen: Nasopharyngeal Swab  Result Value Ref Range Status   SARS Coronavirus 2 POSITIVE (A) NEGATIVE Final    Comment: RESULT CALLED TO, READ BACK BY AND VERIFIED WITH: SRonny Flurry. WILSON,RN 2147 09/21/2019 T. TYSOR (NOTE) SARS-CoV-2 target nucleic acids are DETECTED. The SARS-CoV-2 RNA is generally detectable in upper and lower respiratory specimens during the acute phase of infection. Positive results are indicative of active infection with SARS-CoV-2. Clinical  correlation with patient history and other diagnostic information is necessary to determine patient infection status. Positive results do  not rule out bacterial infection or co-infection with other viruses. The expected result is Negative. Fact Sheet for Patients: HairSlick.nohttps://www.fda.gov/media/138098/download Fact Sheet for Healthcare Providers: quierodirigir.comhttps://www.fda.gov/media/138095/download This test is not yet approved or cleared by the [place] FDA and  has been authorized for detection and/or diagnosis of SARS-CoV-2 by FDA under an Emergency Use Authorization (EUA). This EUA will remain  in effect (meaning this test can be used) for  the duration of the COVID-19 declaration under Section 564(b)(1) of the Act, 21 U.S.C. section 360bbb-3(b)(1), unless the authorization is terminated or revoked sooner. Performed at [facility], [address] [place], [place] [address]       Radiology Studies: No results found.     LOS: 6 days   Dilcia Rybarczyk [employer]nan  [facility] Pager on [email or web address]  09/27/2019, 10:53 AM

## 2019-09-28 LAB — GLUCOSE, CAPILLARY
Glucose-Capillary: 110 mg/dL — ABNORMAL HIGH (ref 70–99)
Glucose-Capillary: 207 mg/dL — ABNORMAL HIGH (ref 70–99)
Glucose-Capillary: 253 mg/dL — ABNORMAL HIGH (ref 70–99)
Glucose-Capillary: 289 mg/dL — ABNORMAL HIGH (ref 70–99)

## 2019-09-28 MED ORDER — FUROSEMIDE 10 MG/ML IJ SOLN
40.0000 mg | Freq: Once | INTRAMUSCULAR | Status: AC
  Administered 2019-09-28: 40 mg via INTRAVENOUS
  Filled 2019-09-28: qty 4

## 2019-09-28 NOTE — Progress Notes (Signed)
PROGRESS NOTE  Aaron Daniels UJW:[id number] DOB: 1941-07-06 DOA: 09/21/2019  PCP: Albertha GheeSteen, James, MD  Brief History/Interval Summary: 78 year old with a history of HTN, BPH status post TURP, COPD requiring 3 L home O2, and diastolic CHF who was discharged 9/28 after admission for hypoxic respiratory failure.  He returned to the ED with 2 to 3 days of progressively worsening lethargy with loose stools, and severe shortness of breath.  The patient's grandson found him with obvious shortness of breath clutching his chest and very somnolent.  [facility] he displayed increased work of breathing and was hypoxic requiring 10 L via Venturi mask to keep sats at 88 or better.  He was admitted initially with a diagnosis of an acute CHF exacerbation.  He was later found to be Covid positive.   Reason for Visit: Acute respiratory failure with hypoxia.  Pneumonia due to COVID-19  Consultants: None  Procedures: None  Antibiotics: Anti-infectives (From admission, onward)   Start     Dose/Rate Route Frequency Ordered Stop   09/23/19 1000  remdesivir 100 mg in sodium chloride 0.9 % 250 mL IVPB     100 mg 500 mL/hr over 30 Minutes Intravenous Every 24 hours 09/22/19 0936 09/26/19 1117   09/22/19 1030  remdesivir 200 mg in sodium chloride 0.9 % 250 mL IVPB     200 mg 500 mL/hr over 30 Minutes Intravenous Once 09/22/19 0936 09/22/19 1259      Subjective/Interval History: Patient states that he feels his breathing is shallow this morning.  With exertion he feels short of breath.  No other complaints offered.  Good appetite.  Urinating well.     Assessment/Plan:  Acute Hypoxic Resp. Failure/Pneumonia due to COVID-19  COVID-19 Labs  Recent Labs    09/26/19 0339 09/27/19 0337  DDIMER 1.73* 1.80*  FERRITIN 654* 510*  CRP 3.8* 2.5*    Lab Results  Component Value Date   SARSCOV2NAA POSITIVE (A) 09/21/2019   SARSCOV2NAA NEGATIVE 09/03/2019   SARSCOV2NAA NEGATIVE 07/03/2019     Fever:  Afebrile Oxygen requirements: Nasal cannula at 2 to 3 L.  Saturating in the early 90s.   Antibacterials: None Remdesivir: Completed course of remdesivir on 10/16 Steroids: Dexamethasone 6 mg daily Diuretics: Lasix x1 today Actemra: Not given yet Convalescent plasma: Given on 10/12 Vitamin C and Zinc: Continue DVT Prophylaxis:  Lovenox 40 mg daily  Patient's respiratory status has been stable.  This morning he is noted to be a little bit more dyspneic.  On 2 L he saturating in the late 80s to early 90s.  He has completed course of Remdesivir.  He remains on steroids.  He was given convalescent plasma.  He will be given a dose of Lasix today.  Continue to mobilize.  Inflammatory markers have improved.  D-dimer 1.8.  Incentive spirometer.  Acute urinary retention/history of BPH Apparently had significant urinary retention few days ago.  Foley was placed.  Patient does have a history of BPH but he is status post surgery.  Not noted to be on any medications for same at home.  He takes over-the-counter medications.  He was placed on Flomax.  Foley catheter removed few days ago.  Has been able to void.  Bladder scan as needed.    Hyperglycemia due to steroids/history of diabetes mellitus type 2 Not on any glucose lowering agents at home.  Elevated CBGs due to steroids.  Continue SSI and Lantus.  HbA1c was 6.5 in September.  CBG should improve as steroid  is tapered down.   Transaminitis Transaminitis is due to COVID-19 and Remdesivir.  Has been stable.  Recheck labs tomorrow.  Acute kidney injury Most likely due to ATN in the setting of hypotension.  Monitor urine output.  Check labs while he is getting Lasix.  Gastroenteritis secondary to COVID-19 Apparently he had 7 days of frequent diarrhea.  Seems to have improved.  Continue to monitor closely.  Hyperkalemia This has resolved with Lokelma.    Chronic diastolic CHF EF 65 to 70% per echocardiogram.  Try to keep him in negative fluid  balance.  Hypotension Due to hypovolemia.  Now resolved.    History of COPD on chronic home oxygen Apparently on 3-4 L of oxygen by nasal cannula at home.    Normocytic anemia No evidence of overt bleeding.  Hemoglobin is stable.  Suspicion for OSA Evaluated in pulmonology clinic 10/5.  Will need continued outpatient follow-up.  Obesity Estimated body mass index is 30.34 kg/m as calculated from the following:   Height as of this encounter: 5\' 11"  (1.803 m).   Weight as of this encounter: 98.7 kg.   DVT Prophylaxis: Lovenox PUD Prophylaxis: Pepcid Code Status: Full code Family Communication: Discussed with patient.  Family being updated on a daily basis.  Could not reach them yesterday. Disposition Plan: Anticipate discharge in 1 to 2 days.  Likely go home with home health   Medications:  Scheduled: . Chlorhexidine Gluconate Cloth  6 each Topical Daily  . dexamethasone (DECADRON) injection  6 mg Intravenous Q24H  . enoxaparin (LOVENOX) injection  40 mg Subcutaneous Daily  . famotidine  20 mg Oral Daily  . furosemide  40 mg Intravenous Once  . insulin aspart  0-15 Units Subcutaneous TID WC  . insulin aspart  0-5 Units Subcutaneous QHS  . insulin aspart  4 Units Subcutaneous TID WC  . insulin glargine  15 Units Subcutaneous Daily  . tamsulosin  0.4 mg Oral Daily  . umeclidinium-vilanterol  1 puff Inhalation Daily  . vitamin C  500 mg Oral Daily  . zinc sulfate  220 mg Oral Daily   Continuous:  **OR** [DISCONTINUED] acetaminophen, albuterol, chlorpheniramine-HYDROcodone, guaiFENesin-dextromethorphan, morphine injection   Objective:  Vital Signs  Vitals:   09/27/19 2052 09/28/19 0022 09/28/19 0442 09/28/19 0756  BP: 115/81 125/90  132/90  Pulse: 93 85  91  Resp:    18  Temp: 98 F (36.7 C) 98 F (36.7 C)  97.6 F (36.4 C)  TempSrc: Oral Oral  Oral  SpO2: 92% 90%  91%  Weight:   98.7 kg   Height:       No intake or output data in the 24  hours ending 09/28/19 1006 Filed Weights   09/25/19 0500 09/27/19 0453 09/28/19 0442  Weight: 97.6 kg 98.9 kg 98.7 kg    General appearance: Awake alert.  In no distress.  Mildly distracted Resp: Mildly tachypneic at rest.  Coarse breath sounds bilaterally.  Crackles at the bases.  No wheezing or rhonchi.   Cardio: S1-S2 is normal regular.  No S3-S4.  No rubs murmurs or bruit GI: Abdomen is soft.  Nontender nondistended.  Bowel sounds are present normal.  No masses organomegaly Extremities: No edema.  Full range of motion of lower extremities. Neurologic: No focal neurological deficits.    Lab Results:  Data Reviewed: I have personally reviewed following labs and imaging studies  CBC: Recent Labs  Lab 09/23/19 0511 09/24/19 0345 09/25/19 0415 09/26/19 0339 09/27/19 0337  WBC  4.7 7.5 7.7 7.2 5.5  NEUTROABS 4.0 6.6 6.7 5.9 4.3  HGB 11.9* 11.4* 11.6* 12.1* 12.2*  HCT 38.9* 37.6* 39.1 39.6 38.8*  MCV 93.5 93.3 94.7 94.1 90.9  PLT 219 258 274 289 177    Basic Metabolic Panel: Recent Labs  Lab 09/23/19 0511 09/24/19 0345 09/24/19 1505 09/25/19 0415 09/26/19 0339 09/27/19 0337  NA 140 141  --  143 139 139  K 4.7 5.6* 4.7 4.6 4.5 4.5  CL 99 102  --  102 100 100  CO2 29 30  --  32 30 29  GLUCOSE 194* 223*  --  162* 240* 155*  BUN 43* 53*  --  41* 39* 33*  CREATININE 1.24 1.19  --  1.05 1.04 0.92  CALCIUM 8.4* 8.5*  --  8.7* 8.7* 8.4*  MG 2.1 2.3  --  2.1 2.0 2.0    GFR: Estimated Creatinine Clearance: 76.7 mL/min (by C-G formula based on SCr of 0.92 mg/dL).  Liver Function Tests: Recent Labs  Lab 09/23/19 0511 09/24/19 0345 09/25/19 0415 09/26/19 0339 09/27/19 0337  AST 39 31 52* 172* 55*  ALT 44 40 55* 184* 129*  ALKPHOS 47 48 51 57 60  BILITOT 0.4 0.3 0.3 0.3 0.4  PROT 5.7* 5.5* 5.4* 5.1* 5.3*  ALBUMIN 2.5* 2.2* 2.2* 2.1* 2.4*    Anemia Panel: Recent Labs    09/26/19 0339 09/27/19 0337  FERRITIN 654* 510*    Recent Results (from the past 240  hour(s))  SARS CORONAVIRUS 2 (TAT 6-24 HRS) Nasopharyngeal Nasopharyngeal Swab     Status: Abnormal   Collection Time: 09/21/19  3:43 PM   Specimen: Nasopharyngeal Swab  Result Value Ref Range Status   SARS Coronavirus 2 POSITIVE (A) NEGATIVE Final    Comment: RESULT CALLED TO, READ BACK BY AND VERIFIED WITH: SAndrey Campanile 2147 09/21/2019 T. TYSOR (NOTE) SARS-CoV-2 target nucleic acids are DETECTED. The SARS-CoV-2 RNA is generally detectable in upper and lower respiratory specimens during the acute phase of infection. Positive results are indicative of active infection with SARS-CoV-2. Clinical  correlation with patient history and other diagnostic information is necessary to determine patient infection status. Positive results do  not rule out bacterial infection or co-infection with other viruses. The expected result is Negative. Fact Sheet for Patients: SugarRoll.be Fact Sheet for Healthcare Providers: https://www.woods-mathews.com/ This test is not yet approved or cleared by the [place] FDA and  has been authorized for detection and/or diagnosis of SARS-CoV-2 by FDA under an Emergency Use Authorization (EUA). This EUA will remain  in effect (meaning this test can be used) for  the duration of the COVID-19 declaration under Section 564(b)(1) of the Act, 21 U.S.C. section 360bbb-3(b)(1), unless the authorization is terminated or revoked sooner. Performed at [facility], [place] [address] [place], [place] [address]       Radiology Studies: No results found.     LOS: 7 days   Nicha Hemann [employer] on [email or web address]  09/28/2019, 10:06 AM

## 2019-09-29 ENCOUNTER — Encounter (HOSPITAL_BASED_OUTPATIENT_CLINIC_OR_DEPARTMENT_OTHER): Payer: Medicare Other | Admitting: Pulmonary Disease

## 2019-09-29 LAB — CBC
HCT: 39.8 % (ref 39.0–52.0)
Hemoglobin: 12.5 g/dL — ABNORMAL LOW (ref 13.0–17.0)
MCH: 28.5 pg (ref 26.0–34.0)
MCHC: 31.4 g/dL (ref 30.0–36.0)
MCV: 90.9 fL (ref 80.0–100.0)
Platelets: 342 10*3/uL (ref 150–400)
RBC: 4.38 MIL/uL (ref 4.22–5.81)
RDW: 15.9 % — ABNORMAL HIGH (ref 11.5–15.5)
WBC: 7.1 10*3/uL (ref 4.0–10.5)
nRBC: 0 % (ref 0.0–0.2)

## 2019-09-29 LAB — COMPREHENSIVE METABOLIC PANEL
ALT: 78 U/L — ABNORMAL HIGH (ref 0–44)
AST: 24 U/L (ref 15–41)
Albumin: 2.5 g/dL — ABNORMAL LOW (ref 3.5–5.0)
Alkaline Phosphatase: 60 U/L (ref 38–126)
Anion gap: 12 (ref 5–15)
BUN: 32 mg/dL — ABNORMAL HIGH (ref 8–23)
CO2: 31 mmol/L (ref 22–32)
Calcium: 8.8 mg/dL — ABNORMAL LOW (ref 8.9–10.3)
Chloride: 95 mmol/L — ABNORMAL LOW (ref 98–111)
Creatinine, Ser: 1 mg/dL (ref 0.61–1.24)
GFR calc Af Amer: >60 mL/min (ref >60)
GFR calc non Af Amer: >60 mL/min (ref >60)
Glucose, Bld: 103 mg/dL — ABNORMAL HIGH (ref 70–99)
Potassium: 4.5 mmol/L (ref 3.5–5.1)
Sodium: 138 mmol/L (ref 135–145)
Total Bilirubin: 1 mg/dL (ref 0.3–1.2)
Total Protein: 5.6 g/dL — ABNORMAL LOW (ref 6.5–8.1)

## 2019-09-29 LAB — GLUCOSE, CAPILLARY
Glucose-Capillary: 115 mg/dL — ABNORMAL HIGH (ref 70–99)
Glucose-Capillary: 382 mg/dL — ABNORMAL HIGH (ref 70–99)
Glucose-Capillary: 301 mg/dL — ABNORMAL HIGH (ref 70–99)

## 2019-09-29 MED ORDER — FUROSEMIDE 10 MG/ML IJ SOLN
60.0000 mg | Freq: Once | INTRAMUSCULAR | Status: AC
  Administered 2019-09-29: 60 mg via INTRAVENOUS
  Filled 2019-09-29: qty 6

## 2019-09-29 NOTE — Progress Notes (Signed)
PROGRESS NOTE  Aaron PiesWilliam Daniels JYN:[id number] DOB: 1941-11-08 DOA: 09/21/2019  PCP: Albertha GheeSteen, James, MD  Brief History/Interval Summary: 78 year old with a history of HTN, BPH status post TURP, COPD requiring 3 L home O2, and diastolic CHF who was discharged 9/28 after admission for hypoxic respiratory failure.  He returned to the ED with 2 to 3 days of progressively worsening lethargy with loose stools, and severe shortness of breath.  The patient's grandson found him with obvious shortness of breath clutching his chest and very somnolent.  In [facility] ED he displayed increased work of breathing and was hypoxic requiring 10 L via Venturi mask to keep sats at 88 or better.  He was admitted initially with a diagnosis of an acute CHF exacerbation.  He was later found to be Covid positive.   Reason for Visit: Acute respiratory failure with hypoxia.  Pneumonia due to COVID-19  Consultants: None  Procedures: None  Antibiotics: Anti-infectives (From admission, onward)   Start     Dose/Rate Route Frequency Ordered Stop   09/23/19 1000  remdesivir 100 mg in sodium chloride 0.9 % 250 mL IVPB     100 mg 500 mL/hr over 30 Minutes Intravenous Every 24 hours 09/22/19 0936 09/26/19 1117   09/22/19 1030  remdesivir 200 mg in sodium chloride 0.9 % 250 mL IVPB     200 mg 500 mL/hr over 30 Minutes Intravenous Once 09/22/19 0936 09/22/19 1259      Subjective/Interval History: Patient states that he is feeling better this morning.  Had a good night.  Good appetite.  Urinating well.      Assessment/Plan:  Acute Hypoxic Resp. Failure/Pneumonia due to COVID-19  COVID-19 Labs  Recent Labs    09/27/19 0337  DDIMER 1.80*  FERRITIN 510*  CRP 2.5*    Lab Results  Component Value Date   SARSCOV2NAA POSITIVE (A) 09/21/2019   SARSCOV2NAA NEGATIVE 09/03/2019   SARSCOV2NAA NEGATIVE 07/03/2019     Fever: Afebrile Oxygen requirements: Nasal cannula at 2 to 3 L/min.  Saturating in the early 90s.    Antibacterials: None Remdesivir: Completed course of remdesivir on 10/16 Steroids: Dexamethasone 6 mg daily Diuretics: Lasix given yesterday.  Dose will be repeated today. Actemra: Not given yet Convalescent plasma: Given on 10/12 Vitamin C and Zinc: Continue DVT Prophylaxis:  Lovenox 40 mg daily  Patient's respiratory status has been stable.  He is back to his usual oxygen requirement of 2 to 3 L.  He was noted to be somewhat volume overloaded yesterday.  Was given Lasix.  Feels better this morning.  He will be given additional dose of Lasix today.  He has completed course of Remdesivir.  He was also given convalescent plasma.  He remains on steroids.  Steroid is being tapered down.  Inflammatory markers had improved.  D-dimer 1.8.  Continue mobilization, incentive spirometer.  Hopefully home in 1 to 2 days.  Acute urinary retention/history of BPH Apparently had significant urinary retention few days ago.  Foley was placed.  Patient does have a history of BPH but he is status post surgery.  Not noted to be on any medications for same at home.  He takes over-the-counter medications.  He was placed on Flomax.  Foley catheter removed few days ago.  Has been able to void.  Bladder scan as needed.    Hyperglycemia due to steroids/history of diabetes mellitus type 2 Not on any glucose lowering agents at home.  Elevated CBGs due to steroids.  Continue SSI and Lantus.  HbA1c was 6.5 in September.  CBG should improve as steroid is tapered down.  CBGs are stable.  Transaminitis Transaminitis is due to COVID-19 and Remdesivir.  Left knee is have improved.  Acute kidney injury Most likely due to ATN in the setting of hypotension.  Now resolved.  Monitor urine output while he is getting diuretics.  Gastroenteritis secondary to COVID-19 Apparently he had 7 days of frequent diarrhea.  Seems to have improved.  Continue to monitor closely.  Hyperkalemia This has resolved with Lokelma.    Chronic  diastolic CHF EF 65 to 70% per echocardiogram.  Try to keep him in negative fluid balance.  Hypotension Due to hypovolemia.  Now resolved.    History of COPD on chronic home oxygen Apparently on oxygen at home by nasal cannula.  Normocytic anemia No evidence of overt bleeding.  Hemoglobin is stable.  Suspicion for OSA Evaluated in pulmonology clinic 10/5.  Will need continued outpatient follow-up.  Obesity Estimated body mass index is 30.34 kg/m as calculated from the following:   Height as of this encounter: 5\' 11"  (1.803 m).   Weight as of this encounter: 98.7 kg.   DVT Prophylaxis: Lovenox PUD Prophylaxis: Pepcid Code Status: Full code Family Communication: Discussed with patient.  Son being updated daily.   Disposition Plan: We will need to go home with home health.  Lasix again today.   Medications:  Scheduled: . dexamethasone (DECADRON) injection  6 mg Intravenous Q24H  . enoxaparin (LOVENOX) injection  40 mg Subcutaneous Daily  . famotidine  20 mg Oral Daily  . insulin aspart  0-15 Units Subcutaneous TID WC  . insulin aspart  0-5 Units Subcutaneous QHS  . insulin aspart  4 Units Subcutaneous TID WC  . insulin glargine  15 Units Subcutaneous Daily  . tamsulosin  0.4 mg Oral Daily  . umeclidinium-vilanterol  1 puff Inhalation Daily  . vitamin C  500 mg Oral Daily  . zinc sulfate  220 mg Oral Daily   Continuous:  **OR** [DISCONTINUED] acetaminophen, albuterol, chlorpheniramine-HYDROcodone, guaiFENesin-dextromethorphan, morphine injection   Objective:  Vital Signs  Vitals:   09/28/19 2037 09/29/19 0039 09/29/19 0414 09/29/19 0827  BP: 116/80 104/70 110/72 126/90  Pulse: 92 90 88 85  Resp:    18  Temp: 97.6 F (36.4 C) 98.6 F (37 C) 97.9 F (36.6 C) 97.7 F (36.5 C)  TempSrc: Oral Oral Oral Oral  SpO2: 93% 93% 90% 92%  Weight:      Height:        Intake/Output Summary (Last 24 hours) at 09/29/2019 1128 Last data filed at  09/28/2019 1644 Gross per 24 hour  Intake -  Output 800 ml  Net -800 ml   Filed Weights   09/25/19 0500 09/27/19 0453 09/28/19 0442  Weight: 97.6 kg 98.9 kg 98.7 kg    General appearance: Awake alert.  In no distress.  Mildly distracted Resp: Normal effort at rest.  Coarse breath sound bilaterally with crackles at the bases.  No wheezing or rhonchi.   Cardio: S1-S2 is normal regular.  No S3-S4.  No rubs murmurs or bruit GI: Abdomen is soft.  Nontender nondistended.  Bowel sounds are present normal.  No masses organomegaly Extremities: No edema.  Full range of motion of lower extremities. Neurologic:  No focal neurological deficits.    Lab Results:  Data Reviewed: I have personally reviewed following labs and imaging studies  CBC: Recent Labs  Lab 09/23/19 0511 09/24/19 0345 09/25/19 0415 09/26/19 09/28/19  09/27/19 0337 09/29/19 0320  WBC 4.7 7.5 7.7 7.2 5.5 7.1  NEUTROABS 4.0 6.6 6.7 5.9 4.3  --   HGB 11.9* 11.4* 11.6* 12.1* 12.2* 12.5*  HCT 38.9* 37.6* 39.1 39.6 38.8* 39.8  MCV 93.5 93.3 94.7 94.1 90.9 90.9  PLT 219 258 274 289 313 229    Basic Metabolic Panel: Recent Labs  Lab 09/23/19 0511 09/24/19 0345 09/24/19 1505 09/25/19 0415 09/26/19 0339 09/27/19 0337 09/29/19 0320  NA 140 141  --  143 139 139 138  K 4.7 5.6* 4.7 4.6 4.5 4.5 4.5  CL 99 102  --  102 100 100 95*  CO2 29 30  --  32 30 29 31   GLUCOSE 194* 223*  --  162* 240* 155* 103*  BUN 43* 53*  --  41* 39* 33* 32*  CREATININE 1.24 1.19  --  1.05 1.04 0.92 1.00  CALCIUM 8.4* 8.5*  --  8.7* 8.7* 8.4* 8.8*  MG 2.1 2.3  --  2.1 2.0 2.0  --     GFR: Estimated Creatinine Clearance: 70.6 mL/min (by C-G formula based on SCr of 1 mg/dL).  Liver Function Tests: Recent Labs  Lab 09/24/19 0345 09/25/19 0415 09/26/19 0339 09/27/19 0337 09/29/19 0320  AST 31 52* 172* 55* 24  ALT 40 55* 184* 129* 78*  ALKPHOS 48 51 57 60 60  BILITOT 0.3 0.3 0.3 0.4 1.0  PROT 5.5* 5.4* 5.1* 5.3* 5.6*  ALBUMIN 2.2*  2.2* 2.1* 2.4* 2.5*    Anemia Panel: Recent Labs    09/27/19 7989  FERRITIN 510*    Recent Results (from the past 240 hour(s))  SARS CORONAVIRUS 2 (TAT 6-24 HRS) Nasopharyngeal Nasopharyngeal Swab     Status: Abnormal   Collection Time: 09/21/19  3:43 PM   Specimen: Nasopharyngeal Swab  Result Value Ref Range Status   SARS Coronavirus 2 POSITIVE (A) NEGATIVE Final    Comment: RESULT CALLED TO, READ BACK BY AND VERIFIED WITH: SAndrey Campanile 2147 09/21/2019 T. TYSOR (NOTE) SARS-CoV-2 target nucleic acids are DETECTED. The SARS-CoV-2 RNA is generally detectable in upper and lower respiratory specimens during the acute phase of infection. Positive results are indicative of active infection with SARS-CoV-2. Clinical  correlation with patient history and other diagnostic information is necessary to determine patient infection status. Positive results do  not rule out bacterial infection or co-infection with other viruses. The expected result is Negative. Fact Sheet for Patients: SugarRoll.be Fact Sheet for Healthcare Providers: https://www.woods-mathews.com/ This test is not yet approved or cleared by the [place] FDA and  has been authorized for detection and/or diagnosis of SARS-CoV-2 by FDA under an Emergency Use Authorization (EUA). This EUA will remain  in effect (meaning this test can be used) for  the duration of the COVID-19 declaration under Section 564(b)(1) of the Act, 21 U.S.C. section 360bbb-3(b)(1), unless the authorization is terminated or revoked sooner. Performed at [facility], [place] [address] [place], [place] [address]       Radiology Studies: No results found.     LOS: 8 days   Ernie Sagrero [employer] on [email or web address]  09/29/2019, 11:28 AM

## 2019-09-29 NOTE — Progress Notes (Signed)
Communication   Spoke with Ginger Organ and updated him on patient, all questions answered. Gus Height RN

## 2019-09-29 NOTE — Progress Notes (Addendum)
Occupational Therapy Treatment Patient Details Name: Aaron Daniels MRN: [id number] DOB: 07-08-1941 Today's Date: 09/29/2019    History of present illness 78 y/o male recently d/c home on 09/28 after hospitalization w/ hypoxic respiratory failure. returned to ED w/ worsening SOB, admitted with acute CHF exacerbation then found to be COVID +   OT comments  Pt making gradual progress towards OT goals. Tolerated OOB to recliner during session and short distance mobility within room using RW. Pt HR fluctuating between 110s with max HR noted 130 with standing activity today. Overall requiring minguard assist for mobility tasks. Pt on 3L HFNC with lowest SpO2 noted 87%. Pt does require redirection to functional tasks due to distractibility, but very pleasant and willing to work with therapies. Feel POC remains appropriate at this time. Will continue to follow acutely.   Follow Up Recommendations  Home health OT;Supervision - Intermittent    Equipment Recommendations  None recommended by OT          Precautions / Restrictions Precautions Precautions: Fall;Other (comment) Precaution Comments: a-fib HR flutuates extremely w/ activity Restrictions Weight Bearing Restrictions: No       Mobility Bed Mobility Overal bed mobility: Needs Assistance Bed Mobility: Supine to Sit     Supine to sit: Supervision;HOB elevated     General bed mobility comments: HOB slightly elevated, supervision for lines/safety  Transfers Overall transfer level: Needs assistance Equipment used: Rolling walker (2 wheeled) Transfers: Sit to/from [employer] Sit to Stand: Supervision Stand pivot transfers: Min guard       General transfer comment: for lines, safety and balance; pt transitioning to chair and then performing additional sit<>stands from recliner at supervision level    Balance Overall balance assessment: Needs assistance Sitting-balance support: Feet supported Sitting  balance-Leahy Scale: Good     Standing balance support: During functional activity Standing balance-Leahy Scale: Fair                             ADL either performed or assessed with clinical judgement   ADL Overall ADL's : Needs assistance/impaired     Grooming: Set up;Sitting;Wash/dry face                               Functional mobility during ADLs: Min guard;Rolling walker General ADL Comments: pt transferred to recliner, after seated rest performing sit<>stands and taking few steps forwards/backwards from recliner to simulate room level distance (kept on central tele to monitor HR with activity this session)                        Cognition Arousal/Alertness: Awake/alert Behavior During Therapy: WFL for tasks assessed/performed Overall Cognitive Status: No family/caregiver present to determine baseline cognitive functioning                                 General Comments: most likely baseline, oriented to self, place, and time (year and month); pt easily distracted and requires redirection to functional tasks        Exercises Exercises: Other exercises Other Exercises Other Exercises: use of IS with min cues   Shoulder Instructions       General Comments      Pertinent Vitals/ Pain       Pain Assessment: No/denies pain  Home Living  Prior Functioning/Environment              Frequency  Min 3X/week        Progress Toward Goals  OT Goals(current goals can now be found in the care plan section)  Progress towards OT goals: Progressing toward goals  Acute Rehab OT Goals Patient Stated Goal: to go home and be with wife and dog OT Goal Formulation: With patient Time For Goal Achievement: 10/10/19 Potential to Achieve Goals: Good ADL Goals Pt Will Perform Lower Body Bathing: with modified independence;sit to/from stand Pt Will Perform Lower  Body Dressing: with modified independence;sit to/from stand Pt Will Transfer to Toilet: with modified independence;ambulating Additional ADL Goal #1: Pt will independently verbalize 3 strategies for energy conservation for ADL/IADL tasks  Plan Discharge plan remains appropriate    Co-evaluation                 AM-PAC OT "6 Clicks" Daily Activity     Outcome Measure   Help from another person eating meals?: None Help from another person taking care of personal grooming?: A Little Help from another person toileting, which includes using toliet, bedpan, or urinal?: A Little Help from another person bathing (including washing, rinsing, drying)?: A Little Help from another person to put on and taking off regular upper body clothing?: A Little Help from another person to put on and taking off regular lower body clothing?: A Little 6 Click Score: 19    End of Session Equipment Utilized During Treatment: Oxygen  OT Visit Diagnosis: Unsteadiness on feet (R26.81);Muscle weakness (generalized) (M62.81)   Activity Tolerance Patient tolerated treatment well   Patient Left in chair;with call bell/phone within reach;with chair alarm set   Nurse Communication Mobility status        Time: 1610-9604 OT Time Calculation (min): 37 min  Charges: OT General Charges $OT Visit: 1 Visit OT Treatments $Self Care/Home Management : 8-22 mins $Therapeutic Activity: 8-22 mins  Lou Cal, OT Supplemental Rehabilitation Services Pager [phone] Office [place] 09/29/2019, 3:34 PM

## 2019-09-29 NOTE — Progress Notes (Signed)
Inpatient Diabetes Program Recommendations  AACE/ADA: New Consensus Statement on Inpatient Glycemic Control (2015)  Target Ranges:  Prepandial:   less than 140 mg/dL      Peak postprandial:   less than 180 mg/dL (1-2 hours)      Critically ill patients:  140 - 180 mg/dL   Lab Results  Component Value Date   GLUCAP 382 (H) 09/29/2019   HGBA1C 6.5 (H) 09/04/2019    Review of Glycemic Control Results for BORDEN, THUNE (MRN [id number]) as of 09/29/2019 15:37  Ref. Range 09/29/2019 08:08 09/29/2019 12:32  Glucose-Capillary Latest Ref Range: 70 - 99 mg/dL 115 (H) 382 (H)   Diabetes history: DM 2 Outpatient Diabetes medications: None  Current orders for Inpatient glycemic control:  Novolog moderate tid with meals and HS Decadron 6 mg daily Lantus 15 units daily Novolog 4 units tid with meals Inpatient Diabetes Program Recommendations:   May consider increasing Novolog meal coverage to 6 units tid with meals while on steroids.   Thanks Adah Perl, RN, BC-ADM Inpatient Diabetes Coordinator Pager [phone] (8a-5p)

## 2019-09-29 NOTE — Progress Notes (Signed)
Physical Therapy Treatment Patient Details Name: Aaron Daniels MRN: [id number] DOB: 1941/11/07 Today's Date: 09/29/2019    History of Present Illness 78 y/o male recently d/c home on 09/28 after hospitalization w/ hypoxic respiratory failure. returned to ED w/ worsening SOB, admitted with acute CHF exacerbation then found to be COVID +    PT Comments    Pt making steady progress with mobility. Continues to have elevated HR with activity. Pt with good home support.    Follow Up Recommendations  Home health PT;Supervision - Intermittent     Equipment Recommendations  None recommended by PT    Recommendations for Other Services       Precautions / Restrictions Precautions Precautions: Fall;Other (comment) Precaution Comments: a-fib HR flutuates extremely w/ activity Restrictions Weight Bearing Restrictions: No    Mobility  Bed Mobility Overal bed mobility: Needs Assistance Bed Mobility: Sit to Supine     Supine to sit: Supervision;HOB elevated Sit to supine: Supervision   General bed mobility comments: supervision for lines/tubes  Transfers Overall transfer level: Needs assistance Equipment used: Rolling walker (2 wheeled) Transfers: Sit to/from [employer] Sit to Stand: Supervision Stand pivot transfers: Min guard       General transfer comment: assist for lines/safety  Ambulation/Gait Ambulation/Gait assistance: Supervision Gait Distance (Feet): 100 Feet Assistive device: Rolling walker (2 wheeled) Gait Pattern/deviations: Step-through pattern;Decreased stride length;Trunk flexed Gait velocity: decr Gait velocity interpretation: <1.31 ft/sec, indicative of household ambulator General Gait Details: Assist for safety and lines. Amb on 3L of O2 with SpO2 >94%. HR to 120's with activity   Stairs             Wheelchair Mobility    Modified Rankin (Stroke Patients Only)       Balance Overall balance assessment: Needs  assistance Sitting-balance support: Feet supported Sitting balance-Leahy Scale: Good     Standing balance support: During functional activity;No upper extremity supported Standing balance-Leahy Scale: Fair                              Cognition Arousal/Alertness: Awake/alert Behavior During Therapy: WFL for tasks assessed/performed Overall Cognitive Status: No family/caregiver present to determine baseline cognitive functioning                                 General Comments: most likely baseline      Exercises Other Exercises Other Exercises: use of IS with min cues    General Comments        Pertinent Vitals/Pain Pain Assessment: No/denies pain    Home Living                      Prior Function            PT Goals (current goals can now be found in the care plan section) Acute Rehab PT Goals Patient Stated Goal: to go home and be with wife and dog Progress towards PT goals: Progressing toward goals    Frequency    Min 3X/week      PT Plan Current plan remains appropriate    Co-evaluation              AM-PAC PT "6 Clicks" Mobility   Outcome Measure  Help needed turning from your back to your side while in a flat bed without using bedrails?: None Help needed moving from lying on  your back to sitting on the side of a flat bed without using bedrails?: A Little Help needed moving to and from a bed to a chair (including a wheelchair)?: A Little Help needed standing up from a chair using your arms (e.g., wheelchair or bedside chair)?: None Help needed to walk in hospital room?: A Little Help needed climbing 3-5 steps with a railing? : A Little 6 Click Score: 20    End of Session Equipment Utilized During Treatment: Oxygen Activity Tolerance: Patient tolerated treatment well Patient left: in bed;with call bell/phone within reach;with bed alarm set Nurse Communication: Mobility status PT Visit Diagnosis:  Unsteadiness on feet (R26.81);Other abnormalities of gait and mobility (R26.89);Muscle weakness (generalized) (M62.81)     Time: 2263-3354 PT Time Calculation (min) (ACUTE ONLY): 19 min  Charges:  $Gait Training: 8-22 mins                     [facility] PT Acute Rehabilitation Services Pager [phone] Office [phone]    Angelina Ok [facility] 09/29/2019, 4:39 PM

## 2019-09-30 LAB — BASIC METABOLIC PANEL
Anion gap: 10 (ref 5–15)
BUN: 38 mg/dL — ABNORMAL HIGH (ref 8–23)
CO2: 31 mmol/L (ref 22–32)
Calcium: 8.9 mg/dL (ref 8.9–10.3)
Chloride: 95 mmol/L — ABNORMAL LOW (ref 98–111)
Creatinine, Ser: 1.03 mg/dL (ref 0.61–1.24)
GFR calc Af Amer: >60 mL/min (ref >60)
GFR calc non Af Amer: >60 mL/min (ref >60)
Glucose, Bld: 139 mg/dL — ABNORMAL HIGH (ref 70–99)
Potassium: 4.7 mmol/L (ref 3.5–5.1)
Sodium: 136 mmol/L (ref 135–145)

## 2019-09-30 LAB — GLUCOSE, CAPILLARY
Glucose-Capillary: 393 mg/dL — ABNORMAL HIGH (ref 70–99)
Glucose-Capillary: 149 mg/dL — ABNORMAL HIGH (ref 70–99)
Glucose-Capillary: 212 mg/dL — ABNORMAL HIGH (ref 70–99)

## 2019-09-30 MED ORDER — ZINC SULFATE 220 (50 ZN) MG PO CAPS: 220.0000 mg | ORAL_CAPSULE | Freq: Every day | ORAL | 0 refills | Status: AC

## 2019-09-30 MED ORDER — FAMOTIDINE 20 MG PO TABS: 20.0000 mg | ORAL_TABLET | Freq: Every day | ORAL | 0 refills | Status: DC

## 2019-09-30 MED ORDER — DEXAMETHASONE 2 MG PO TABS: ORAL_TABLET | ORAL | 0 refills | Status: DC

## 2019-09-30 MED ORDER — ASCORBIC ACID 500 MG PO TABS: 500.0000 mg | ORAL_TABLET | Freq: Every day | ORAL | 0 refills | Status: AC

## 2019-09-30 MED ORDER — GUAIFENESIN-DM 100-10 MG/5ML PO SYRP: 10.0000 mL | ORAL_SOLUTION | ORAL | 0 refills | Status: DC | PRN

## 2019-09-30 MED ORDER — TAMSULOSIN HCL 0.4 MG PO CAPS: 0.4000 mg | ORAL_CAPSULE | Freq: Every day | ORAL | 1 refills | Status: DC

## 2019-09-30 NOTE — Discharge Instructions (Signed)

## 2019-09-30 NOTE — Discharge Summary (Signed)
[facility]  Physician Discharge Summary   Patient ID: Aaron Daniels MRN: [id number] DOB/AGE: [birth year] 78 y.o.  Admit date: 09/21/2019 Discharge date: 09/30/2019  PCP: Albertha Ghee, MD  DISCHARGE DIAGNOSES:  Pneumonia due to COVID-19 Acute on chronic hypoxic respiratory failure, improved Acute urinary retention, resolved History of BPH Transaminitis Acute kidney injury, resolved Gastroenteritis secondary to COVID-19, improved Chronic diastolic CHF   RECOMMENDATIONS FOR OUTPATIENT FOLLOW UP: 1. Patient to follow-up with PCP 2. Home health ordered.    Home Health: PT and OT Equipment/Devices: Home oxygen  CODE STATUS: DNR  DISCHARGE CONDITION: fair  Diet recommendation: As before  INITIAL HISTORY: 78 year old with a history of HTN, BPH status post TURP, COPD requiring 3 L home O2, and diastolic CHF who was discharged 9/28 after admission for hypoxic respiratory failure. He returned to the ED with 2 to 3 days of progressively worsening lethargy with loose stools, and severe shortness of breath. The patient's grandson found him with obvious shortness of breath clutching his chest and very somnolent. In the [facility] ED he displayed increased work of breathing and was hypoxic requiring 10 L via Venturi mask to keep sats at 88 or better. He was admitted initially with a diagnosis of an acute CHF exacerbation. He was later found to be Covid positive.   HOSPITAL COURSE:   Acute Hypoxic Resp. Failure/Pneumonia due to COVID-19 Patient was admitted to the hospital.  He was noted to have elevated inflammatory markers.  He was placed on remdesivir and steroids.  He was given convalescent plasma.  No Actemra was given.  Patient was very slow to improve.  He usually uses about 3 L of oxygen at home for his history of COPD.  He was requiring high amounts of oxygen here in the hospital.  He slowly started improving.  He was weaned down from 15 L slowly and gradually up to 2 L.   He feels much better.  He was also given Lasix the last couple of days with good diuresis.  Patient feels well enough to go back home today.  He lives with his son and wife.  He was seen by PT and OT.  Home health will be ordered.    Acute urinary retention/history of BPH Apparently had significant urinary retention few days ago.  Foley was placed.  Patient does have a history of BPH but he is status post surgery.  Not noted to be on any medications for same at home.  He takes over-the-counter medications.  He was placed on Flomax.  Foley catheter removed.  Has been able to void.    Hyperglycemia due to steroids/history of diabetes mellitus type 2 Not on any glucose lowering agents at home.  Elevated CBGs due to steroids.    He required SSI as well as Lantus here.  HbA1c was 6.5 in September.  CBGs should improve as steroid is tapered down.    Transaminitis Transaminitis is due to COVID-19 and Remdesivir.   LFTs have improved  Acute kidney injury Most likely due to ATN in the setting of hypotension.  Now resolved.    Gastroenteritis secondary to COVID-19 Apparently he had 7 days of frequent diarrhea.  Seems to have improved.   Hyperkalemia This has resolved with Lokelma.    Chronic diastolic CHF EF 65 to 70% per echocardiogram.    Stable  Hypotension Due to hypovolemia.  Now resolved.    History of COPD on chronic home oxygen Apparently on oxygen at home by nasal cannula.  Normocytic  anemia No evidence of overt bleeding.  Hemoglobin is stable.  Suspicion for OSA Evaluated in pulmonology clinic 10/5.  Will need continued outpatient follow-up.  Obesity Estimated body mass index is 30.34 kg/m as calculated from the following:   Height as of this encounter: 5\' 11"  (1.803 m).   Weight as of this encounter: 98.7 kg.  Pressure injury Pressure Injury 09/24/19 Ear Left;Posterior red area noted to left posterior ear  (Active)  09/24/19 2045  Location: Ear  Location  Orientation: Left;Posterior  Staging:   Wound Description (Comments): red area noted to left posterior ear   Present on Admission: No   Overall stable.  Okay for discharge home today.   PERTINENT LABS:  The results of significant diagnostics from this hospitalization (including imaging, microbiology, ancillary and laboratory) are listed below for reference.    Microbiology: Recent Results (from the past 240 hour(s))  SARS CORONAVIRUS 2 (TAT 6-24 HRS) Nasopharyngeal Nasopharyngeal Swab     Status: Abnormal   Collection Time: 09/21/19  3:43 PM   Specimen: Nasopharyngeal Swab  Result Value Ref Range Status   SARS Coronavirus 2 POSITIVE (A) NEGATIVE Final    Comment: RESULT CALLED TO, READ BACK BY AND VERIFIED WITH: S. 11/21/19 2147 09/21/2019 T. TYSOR (NOTE) SARS-CoV-2 target nucleic acids are DETECTED. The SARS-CoV-2 RNA is generally detectable in upper and lower respiratory specimens during the acute phase of infection. Positive results are indicative of active infection with SARS-CoV-2. Clinical  correlation with patient history and other diagnostic information is necessary to determine patient infection status. Positive results do  not rule out bacterial infection or co-infection with other viruses. The expected result is Negative. Fact Sheet for Patients: 11/21/2019 Fact Sheet for Healthcare Providers: HairSlick.no This test is not yet approved or cleared by the quierodirigir.com FDA and  has been authorized for detection and/or diagnosis of SARS-CoV-2 by FDA under an Emergency Use Authorization (EUA). This EUA will remain  in effect (meaning this test can be used) for  the duration of the COVID-19 declaration under Section 564(b)(1) of the Act, 21 U.S.C. section 360bbb-3(b)(1), unless the authorization is terminated or revoked sooner. Performed at [facility], [address], [place], [place]        Labs:  COVID-19 Labs   Lab Results  Component Value Date   SARSCOV2NAA POSITIVE (A) 09/21/2019   SARSCOV2NAA NEGATIVE 09/03/2019   SARSCOV2NAA NEGATIVE 07/03/2019      Basic Metabolic Panel: Recent Labs  Lab 09/24/19 0345  09/25/19 0415 09/26/19 0339 09/27/19 0337 09/29/19 0320 09/30/19 0303  NA 141  --  143 139 139 138 136  K 5.6*   < > 4.6 4.5 4.5 4.5 4.7  CL 102  --  102 100 100 95* 95*  CO2 30  --  32 30 29 31 31   GLUCOSE 223*  --  162* 240* 155* 103* 139*  BUN 53*  --  41* 39* 33* 32* 38*  CREATININE 1.19  --  1.05 1.04 0.92 1.00 1.03  CALCIUM 8.5*  --  8.7* 8.7* 8.4* 8.8* 8.9  MG 2.3  --  2.1 2.0 2.0  --   --    < > = values in this interval not displayed.   Liver Function Tests: Recent Labs  Lab 09/24/19 0345 09/25/19 0415 09/26/19 0339 09/27/19 0337 09/29/19 0320  AST 31 52* 172* 55* 24  ALT 40 55* 184* 129* 78*  ALKPHOS 48 51 57 60 60  BILITOT 0.3 0.3 0.3  0.4 1.0  PROT 5.5* 5.4* 5.1* 5.3* 5.6*  ALBUMIN 2.2* 2.2* 2.1* 2.4* 2.5*   CBC: Recent Labs  Lab 09/24/19 0345 09/25/19 0415 09/26/19 0339 09/27/19 0337 09/29/19 0320  WBC 7.5 7.7 7.2 5.5 7.1  NEUTROABS 6.6 6.7 5.9 4.3  --   HGB 11.4* 11.6* 12.1* 12.2* 12.5*  HCT 37.6* 39.1 39.6 38.8* 39.8  MCV 93.3 94.7 94.1 90.9 90.9  PLT 258 274 289 313 342   BNP: BNP (last 3 results) Recent Labs    08/13/19 2143 09/03/19 1726 09/21/19 1543  BNP 32.1 85.2 67.7    CBG: Recent Labs  Lab 09/29/19 1232 09/29/19 1636 09/29/19 2133 09/30/19 0732 09/30/19 1121  GLUCAP 382* 301* 393* 149* 212*     IMAGING STUDIES  Ct Angio Chest Pe W/cm &/or Wo Cm  Result Date: 09/21/2019 CLINICAL DATA:  Shortness of breath, PE suspected, high pretest probability EXAM: CT ANGIOGRAPHY CHEST WITH CONTRAST TECHNIQUE: Multidetector CT imaging of the chest was performed using the standard protocol during bolus administration of intravenous contrast. Multiplanar CT image reconstructions and MIPs were  obtained to evaluate the vascular anatomy. CONTRAST:  75mL OMNIPAQUE IOHEXOL 350 MG/ML SOLN COMPARISON:  Most recent CTA chest 09/05/2019 FINDINGS: Cardiovascular: Suboptimal opacification of the pulmonary arteries. Contrast opacifies to the level of the proximal segmental arteries with more distal evaluation limited by bolus and respiratory motion. No central, lobar or proximal segmental pulmonary arterial filling defects are seen. Central pulmonary arteries are normal caliber. Atherosclerotic plaque within the normal caliber aorta. Shared origin of the brachiocephalic and left common carotid. Atheromatous plaque within the proximal great vessels. Normal heart size. Small volume pericardial fluid is likely within physiologic normal. Aortic leaflet calcifications are present. Coronary artery calcifications are present as well. Mediastinum/Nodes: Multiple reactive appearing though nonenlarged lymph nodes are present in the mediastinum and bilateral hila. Thyroid gland and thoracic inlet are unremarkable. Bowing of the posterior trachea reflects imaging during exhalation. No acute tracheal abnormality. The esophagus is unremarkable. Lungs/Pleura: There are patchy multifocal areas of mixed reticular interstitial and ground-glass opacity most pronounced in the periphery of the posterior segments of both upper lobes, within the lingula and right lung base. Small bilateral pleural effusions are present as well. No pneumothorax. Upper Abdomen: Nodular thickening of the adrenal glands is similar to comparison studies No acute abnormalities present in the visualized portions of the upper abdomen. Musculoskeletal: Multilevel degenerative changes are present in the imaged portions of the spine. More severe changes are noted in the lower cervical spine, incompletely evaluated on this CT exam. Mild left and severe right osteoarthrosis of the shoulders. Review of the MIP images confirms the above findings. IMPRESSION: 1.  Suboptimal opacification of the pulmonary arteries but without visible central, lobar or proximal segmental pulmonary arterial filling defects. 2. Patchy multifocal areas of mixed reticular interstitial and ground-glass opacity most pronounced in the periphery of the posterior segments of both upper lobes, within the lingula and right lung base, concerning for a possible acute infectious or inflammatory process. Reactive adenopathy present in the mediastinum and hila. 3. Small bilateral pleural effusions. 4. Nodular thickening of the adrenal glands likely reflecting senescent adrenal hyperplasia. 5. Aortic Atherosclerosis (ICD10-I70.0). Electronically Signed   By: Kreg ShropshirePrice  DeHay M.D.   On: 09/21/2019 22:32   Dg Chest Portable 1 View  Result Date: 09/21/2019 CLINICAL DATA:  Shortness of breath. EXAM: PORTABLE CHEST 1 VIEW COMPARISON:  September 06, 2019. FINDINGS: The heart size and mediastinal contours are within normal limits. No  pneumothorax or pleural effusion is noted. Increased bibasilar atelectasis or edema is noted. No pneumothorax or pleural effusion is noted. The visualized skeletal structures are unremarkable. IMPRESSION: Increased bibasilar atelectasis or edema is noted. Electronically Signed   By: Lupita Raider M.D.   On: 09/21/2019 16:48     DISCHARGE EXAMINATION: Vitals:   09/29/19 1955 09/30/19 0415 09/30/19 0733 09/30/19 1123  BP: 105/75 125/85 107/77 105/73  Pulse: (!) 105 80 88 97  Resp:  Temp: 97.9 F (36.6 C) 97.9 F (36.6 C) 98.2 F (36.8 C) 97.7 F (36.5 C)  TempSrc: Oral Oral Oral Oral  SpO2: 91% 92% 90% 91%  Weight:      Height:       General appearance: Awake alert.  In no distress Resp: Normal effort at rest.  Coarse breath sounds bilaterally.  Few crackles at the bases.  No wheezing or rhonchi. Cardio: S1-S2 is normal regular.  No S3-S4.  No rubs murmurs or bruit GI: Abdomen is soft.  Nontender nondistended.  Bowel sounds are present normal.  No masses  organomegaly Extremities: No edema.  Full range of motion of lower extremities. Neurologic: Alert and oriented x3.  No focal neurological deficits.    DISPOSITION: Home with home health  Discharge Instructions    Call MD for:  difficulty breathing, headache or visual disturbances   Complete by: As directed    Call MD for:  extreme fatigue   Complete by: As directed    Call MD for:  persistant dizziness or light-headedness   Complete by: As directed    Call MD for:  persistant nausea and vomiting   Complete by: As directed    Call MD for:  severe uncontrolled pain   Complete by: As directed    Call MD for:  temperature >100.4   Complete by: As directed    Discharge instructions   Complete by: As directed    COVID 19 INSTRUCTIONS  - You are felt to be stable enough to no longer require inpatient monitoring, testing, and treatment, though you will need to follow the recommendations below: - Based on the CDC's non-test criteria for ending self-isolation: You may not return to work/leave the home until at least 21 days since symptom onset AND 3 days without a fever (without taking tylenol, ibuprofen, etc.) AND have improvement in respiratory symptoms. - Do not take NSAID medications (including, but not limited to, ibuprofen, advil, motrin, naproxen, aleve, goody's powder, etc.) - Follow up with your doctor in the next week via telehealth or seek medical attention right away if your symptoms get WORSE.  - Consider donating plasma after you have recovered (either 14 days after a negative test or 28 days after symptoms have completely resolved) because your antibodies to this virus may be helpful to give to others with life-threatening infections. Please go to the website www.[email or web address] if you would like to consider volunteering for plasma donation.    Directions for you at home:  Wear a facemask You should wear a facemask that covers your nose and mouth when you are in the same room with  other people and when you visit a healthcare provider. People who live with or visit you should also wear a facemask while they are in the same room with you.  Separate yourself from other people in your home As much as possible, you should stay in a different room from other people in your home. Also, you should use a separate  bathroom, if available.  Avoid sharing household items You should not share dishes, drinking glasses, cups, eating utensils, towels, bedding, or other items with other people in your home. After using these items, you should wash them thoroughly with soap and water.  Cover your coughs and sneezes Cover your mouth and nose with a tissue when you cough or sneeze, or you can cough or sneeze into your sleeve. Throw used tissues in a lined trash can, and immediately wash your hands with soap and water for at least 20 seconds or use an alcohol-based hand rub.  Wash your [employer] your hands often and thoroughly with soap and water for at least 20 seconds. You can use an alcohol-based hand sanitizer if soap and water are not available and if your hands are not visibly dirty. Avoid touching your eyes, nose, and mouth with unwashed hands.  Directions for those who live with, or provide care at home for you:  Limit the number of people who have contact with the patient If possible, have only one caregiver for the patient. Other household members should stay in another home or place of residence. If this is not possible, they should stay in another room, or be separated from the patient as much as possible. Use a separate bathroom, if available. Restrict visitors who do not have an essential need to be in the home.  Ensure good ventilation Make sure that shared spaces in the home have good air flow, such as from an air conditioner or an opened window, weather permitting.  Wash your hands often Wash your hands often and thoroughly with soap and water for at least 20  seconds. You can use an alcohol based hand sanitizer if soap and water are not available and if your hands are not visibly dirty. Avoid touching your eyes, nose, and mouth with unwashed hands. Use disposable paper towels to dry your hands. If not available, use dedicated cloth towels and replace them when they become wet.  Wear a facemask and gloves Wear a disposable facemask at all times in the room and gloves when you touch or have contact with the patients blood, body fluids, and/or secretions or excretions, such as sweat, saliva, sputum, nasal mucus, vomit, urine, or feces.  Ensure the mask fits over your nose and mouth tightly, and do not touch it during use. Throw out disposable facemasks and gloves after using them. Do not reuse. Wash your hands immediately after removing your facemask and gloves. If your personal clothing becomes contaminated, carefully remove clothing and launder. Wash your hands after handling contaminated clothing. Place all used disposable facemasks, gloves, and other waste in a lined container before disposing them with other household waste. Remove gloves and wash your hands immediately after handling these items.  Do not share dishes, glasses, or other household items with the patient Avoid sharing household items. You should not share dishes, drinking glasses, cups, eating utensils, towels, bedding, or other items with a patient who is confirmed to have, or being evaluated for, COVID-19 infection. After the person uses these items, you should wash them thoroughly with soap and water.  Wash laundry thoroughly Immediately remove and wash clothes or bedding that have blood, body fluids, and/or secretions or excretions, such as sweat, saliva, sputum, nasal mucus, vomit, urine, or feces, on them. Wear gloves when handling laundry from the patient. Read and follow directions on labels of laundry or clothing items and detergent. In general, wash and dry with the warmest  temperatures  recommended on the label.  Clean all areas the individual has used often Clean all touchable surfaces, such as counters, tabletops, doorknobs, bathroom fixtures, toilets, phones, keyboards, tablets, and bedside tables, every day. Also, clean any surfaces that may have blood, body fluids, and/or secretions or excretions on them. Wear gloves when cleaning surfaces the patient has come in contact with. Use a diluted bleach solution (e.g., dilute bleach with 1 part bleach and 10 parts water) or a household disinfectant with a label that says EPA-registered for coronaviruses. To make a bleach solution at home, add 1 tablespoon of bleach to 1 quart (4 cups) of water. For a larger supply, add  cup of bleach to 1 gallon (16 cups) of water. Read labels of cleaning products and follow recommendations provided on product labels. Labels contain instructions for safe and effective use of the cleaning product including precautions you should take when applying the product, such as wearing gloves or eye protection and making sure you have good ventilation during use of the product. Remove gloves and wash hands immediately after cleaning.  Monitor yourself for signs and symptoms of illness Caregivers and household members are considered close contacts, should monitor their health, and will be asked to limit movement outside of the home to the extent possible. Follow the monitoring steps for close contacts listed on the symptom monitoring form.   If you have additional questions, contact your local health department or call the epidemiologist on call at [phone] (available 24/7). This guidance is subject to change. For the most up-to-date guidance from [facility], please refer to their website: TripMetro.hu   You were cared for by a hospitalist during your hospital stay. If you have any questions about your discharge medications or the care  you received while you were in the hospital after you are discharged, you can call the unit and asked to speak with the hospitalist on call if the hospitalist that took care of you is not available. Once you are discharged, your primary care physician will handle any further medical issues. Please note that NO REFILLS for any discharge medications will be authorized once you are discharged, as it is imperative that you return to your primary care physician (or establish a relationship with a primary care physician if you do not have one) for your aftercare needs so that they can reassess your need for medications and monitor your lab values. If you do not have a primary care physician, you can call [phone] for a physician referral.   Increase activity slowly   Complete by: As directed          Allergies as of 09/30/2019      Reactions   Codeine    Penicillins Itching   Did it involve swelling of the face/tongue/throat, SOB, or low BP? No Did it involve sudden or severe rash/hives, skin peeling, or any reaction on the inside of your mouth or nose?Yes Did you need to seek medical attention at a hospital or doctor's office? N/A When did it last happen?N/A If all above answers are NO, may proceed with cephalosporin use.   Sulfa Antibiotics Rash      Medication List    STOP taking these medications   lisinopril 10 MG tablet Commonly known as: ZESTRIL     TAKE these medications   acetaminophen 325 MG tablet Commonly known as: TYLENOL Take 650 mg by mouth every 6 (six) hours as needed for mild pain.   albuterol 108 (90 Base) MCG/ACT inhaler Commonly  known as: VENTOLIN HFA Inhale 3 puffs into the lungs every 6 (six) hours as needed for wheezing or shortness of breath.   albuterol (2.5 MG/3ML) 0.083% nebulizer solution Commonly known as: PROVENTIL Take 3 mLs (2.5 mg total) by nebulization every 6 (six) hours as needed for wheezing or shortness of breath.   ascorbic acid 500 MG  tablet Commonly known as: VITAMIN C Take 1 tablet (500 mg total) by mouth daily for 10 days. Start taking on: October 01, 2019   dexamethasone 2 MG tablet Commonly known as: DECADRON Take 2 tablets once daily for 3 days, then 1 tablet once daily for 3 days, then STOP.   famotidine 20 MG tablet Commonly known as: PEPCID Take 1 tablet (20 mg total) by mouth daily for 10 days. Start taking on: October 01, 2019   furosemide 20 MG tablet Commonly known as: LASIX Take 2 tablets (40 mg total) by mouth daily.   guaiFENesin-dextromethorphan 100-10 MG/5ML syrup Commonly known as: ROBITUSSIN DM Take 10 mLs by mouth every 4 (four) hours as needed for cough.   tamsulosin 0.4 MG Caps capsule Commonly known as: FLOMAX Take 1 capsule (0.4 mg total) by mouth daily after supper.   umeclidinium-vilanterol 62.5-25 MCG/INH Aepb Commonly known as: ANORO ELLIPTA Inhale 1 puff into the lungs daily.   zinc sulfate 220 (50 Zn) MG capsule Take 1 capsule (220 mg total) by mouth daily for 10 days. Start taking on: October 01, 2019        Follow-up Information    [name], MD. Schedule an appointment as soon as possible for a visit in 1 week(s).   Specialty: Internal Medicine Contact information: [address] [place] [address] [phone]           TOTAL DISCHARGE TIME: 87 minutes  [place]  [facility] Pager on [email or web address]  09/30/2019, 2:33 PM

## 2019-09-30 NOTE — TOC Transition Note (Signed)
Transition of Care [facility]) - CM/SW Discharge Note   Patient Details  Name: Aaron Daniels MRN: [id number] Date of Birth: January 02, 1941  [facility] CM/SW Contact:  Weston Anna, LCSW Phone Number: 09/30/2019, 11:15 AM   Clinical Narrative:     Patient set to return home with [facility] notified of discharge. CSW left voicemail for patients APS worker, Wells Guiles, to notify her of discharge. Per previous conversations, she will follow up with patient once discharged. No concerns at this time.   Final next level of care: Home w Home Health Services Barriers to Discharge: No Barriers Identified   Patient Goals and CMS Choice        Discharge Placement                       Discharge Plan and Services                DME Arranged: N/A         HH Arranged: PT, OT HH Agency: [facility]        Social Determinants of Health (SDOH) Interventions     Readmission Risk Interventions No flowsheet data found.

## 2019-09-30 NOTE — Progress Notes (Signed)
[facility] has been called for transport home 

## 2019-10-02 ENCOUNTER — Inpatient Hospital Stay (HOSPITAL_COMMUNITY)
Admission: EM | Admit: 2019-10-02 | Discharge: 2019-10-10 | DRG: 178 | Disposition: A | Discharge status: Home Health | Payer: Medicare Other | Attending: Internal Medicine | Admitting: Internal Medicine

## 2019-10-02 ENCOUNTER — Other Ambulatory Visit: Payer: Self-pay

## 2019-10-02 DIAGNOSIS — I5032 Chronic diastolic (congestive) heart failure: Secondary | ICD-10-CM | POA: Diagnosis present

## 2019-10-02 DIAGNOSIS — J449 Chronic obstructive pulmonary disease, unspecified: Secondary | ICD-10-CM | POA: Diagnosis present

## 2019-10-02 DIAGNOSIS — I110 Hypertensive heart disease with heart failure: Secondary | ICD-10-CM | POA: Diagnosis not present

## 2019-10-02 DIAGNOSIS — I444 Left anterior fascicular block: Secondary | ICD-10-CM | POA: Diagnosis present

## 2019-10-02 DIAGNOSIS — Z8249 Family history of ischemic heart disease and other diseases of the circulatory system: Secondary | ICD-10-CM

## 2019-10-02 DIAGNOSIS — Z23 Encounter for immunization: Secondary | ICD-10-CM | POA: Diagnosis not present

## 2019-10-02 DIAGNOSIS — E119 Type 2 diabetes mellitus without complications: Secondary | ICD-10-CM | POA: Diagnosis not present

## 2019-10-02 DIAGNOSIS — J9611 Chronic respiratory failure with hypoxia: Secondary | ICD-10-CM | POA: Diagnosis not present

## 2019-10-02 DIAGNOSIS — Z885 Allergy status to narcotic agent status: Secondary | ICD-10-CM | POA: Diagnosis not present

## 2019-10-02 DIAGNOSIS — Z79899 Other long term (current) drug therapy: Secondary | ICD-10-CM

## 2019-10-02 DIAGNOSIS — R0602 Shortness of breath: Secondary | ICD-10-CM

## 2019-10-02 DIAGNOSIS — Z66 Do not resuscitate: Secondary | ICD-10-CM | POA: Diagnosis present

## 2019-10-02 DIAGNOSIS — N390 Urinary tract infection, site not specified: Secondary | ICD-10-CM | POA: Diagnosis not present

## 2019-10-02 DIAGNOSIS — I10 Essential (primary) hypertension: Secondary | ICD-10-CM | POA: Diagnosis present

## 2019-10-02 DIAGNOSIS — Z9981 Dependence on supplemental oxygen: Secondary | ICD-10-CM

## 2019-10-02 DIAGNOSIS — E872 Acidosis: Secondary | ICD-10-CM | POA: Diagnosis not present

## 2019-10-02 DIAGNOSIS — G4733 Obstructive sleep apnea (adult) (pediatric): Secondary | ICD-10-CM | POA: Diagnosis present

## 2019-10-02 DIAGNOSIS — Z882 Allergy status to sulfonamides status: Secondary | ICD-10-CM

## 2019-10-02 DIAGNOSIS — E860 Dehydration: Secondary | ICD-10-CM | POA: Diagnosis not present

## 2019-10-02 DIAGNOSIS — Z751 Person awaiting admission to adequate facility elsewhere: Secondary | ICD-10-CM

## 2019-10-02 DIAGNOSIS — U071 COVID-19: Principal | ICD-10-CM | POA: Diagnosis present

## 2019-10-02 DIAGNOSIS — Z833 Family history of diabetes mellitus: Secondary | ICD-10-CM | POA: Diagnosis not present

## 2019-10-02 DIAGNOSIS — Z9119 Patient's noncompliance with other medical treatment and regimen: Secondary | ICD-10-CM | POA: Diagnosis not present

## 2019-10-02 DIAGNOSIS — Z87891 Personal history of nicotine dependence: Secondary | ICD-10-CM

## 2019-10-02 DIAGNOSIS — Z880 Allergy status to penicillin: Secondary | ICD-10-CM

## 2019-10-02 DIAGNOSIS — J441 Chronic obstructive pulmonary disease with (acute) exacerbation: Secondary | ICD-10-CM | POA: Diagnosis present

## 2019-10-02 LAB — CBC WITH DIFFERENTIAL/PLATELET
Abs Immature Granulocytes: 0.22 10*3/uL — ABNORMAL HIGH (ref 0.00–0.07)
Basophils Absolute: 0 10*3/uL (ref 0.0–0.1)
Basophils Relative: 0 %
Eosinophils Absolute: 0 10*3/uL (ref 0.0–0.5)
Eosinophils Relative: 0 %
HCT: 44.5 % (ref 39.0–52.0)
Hemoglobin: 14.2 g/dL (ref 13.0–17.0)
Immature Granulocytes: 2 %
Lymphocytes Relative: 9 %
Lymphs Abs: 0.9 10*3/uL (ref 0.7–4.0)
MCH: 28.5 pg (ref 26.0–34.0)
MCHC: 31.9 g/dL (ref 30.0–36.0)
MCV: 89.4 fL (ref 80.0–100.0)
Monocytes Absolute: 0.7 10*3/uL (ref 0.1–1.0)
Monocytes Relative: 6 %
Neutro Abs: 9 10*3/uL — ABNORMAL HIGH (ref 1.7–7.7)
Neutrophils Relative %: 83 %
Platelets: 335 10*3/uL (ref 150–400)
RBC: 4.98 MIL/uL (ref 4.22–5.81)
RDW: 16 % — ABNORMAL HIGH (ref 11.5–15.5)
WBC: 10.9 10*3/uL — ABNORMAL HIGH (ref 4.0–10.5)
nRBC: 0 % (ref 0.0–0.2)

## 2019-10-02 LAB — COMPREHENSIVE METABOLIC PANEL
ALT: 53 U/L — ABNORMAL HIGH (ref 0–44)
AST: 22 U/L (ref 15–41)
Albumin: 2.6 g/dL — ABNORMAL LOW (ref 3.5–5.0)
Alkaline Phosphatase: 68 U/L (ref 38–126)
Anion gap: 11 (ref 5–15)
BUN: 38 mg/dL — ABNORMAL HIGH (ref 8–23)
CO2: 26 mmol/L (ref 22–32)
Calcium: 8.8 mg/dL — ABNORMAL LOW (ref 8.9–10.3)
Chloride: 97 mmol/L — ABNORMAL LOW (ref 98–111)
Creatinine, Ser: 1.16 mg/dL (ref 0.61–1.24)
GFR calc Af Amer: >60 mL/min (ref >60)
GFR calc non Af Amer: 59 mL/min — ABNORMAL LOW (ref >60)
Glucose, Bld: 156 mg/dL — ABNORMAL HIGH (ref 70–99)
Potassium: 4.9 mmol/L (ref 3.5–5.1)
Sodium: 134 mmol/L — ABNORMAL LOW (ref 135–145)
Total Bilirubin: 0.6 mg/dL (ref 0.3–1.2)
Total Protein: 6.2 g/dL — ABNORMAL LOW (ref 6.5–8.1)

## 2019-10-02 LAB — URINALYSIS, ROUTINE W REFLEX MICROSCOPIC
Bilirubin Urine: NEGATIVE
Glucose, UA: NEGATIVE mg/dL
Hgb urine dipstick: NEGATIVE
Ketones, ur: NEGATIVE mg/dL
Nitrite: NEGATIVE
Protein, ur: NEGATIVE mg/dL
Specific Gravity, Urine: 1.025 (ref 1.005–1.030)
pH: 6 (ref 5.0–8.0)

## 2019-10-02 LAB — LACTIC ACID, PLASMA: Lactic Acid, Venous: 2.5 mmol/L (ref 0.5–1.9)

## 2019-10-02 MED ORDER — SODIUM CHLORIDE 0.9 % IV BOLUS
500.0000 mL | Freq: Once | INTRAVENOUS | Status: AC
  Administered 2019-10-02: 500 mL via INTRAVENOUS

## 2019-10-02 MED ORDER — LACTATED RINGERS IV SOLN
INTRAVENOUS | Status: DC
  Administered 2019-10-02: 22:00:00 via INTRAVENOUS

## 2019-10-02 NOTE — H&P (Signed)
History and Physical    Aaron Daniels ZDG:[id number] DOB: [birth year] DOA: 10/02/2019  PCP: Albertha Ghee, MD Patient coming from: Home  Chief Complaint: COVID-19 infection (positive on 10/11)  HPI: Aaron Daniels is a 78 y.o. male with medical history significant of COPD on 3 L home oxygen, hypertension, chronic diastolic congestive heart failure, type 2 diabetes presenting to the ED with complaints of COVID-19 infection.  Patient was admitted to the hospital from 10/11-10/20 for acute hypoxic respiratory failure secondary to COVID-19 pneumonia.  He was treated with remdesivir and steroids.  In addition, received convalescent plasma.  Patient's son who is his primary caregiver has COVID-19 viral infection and will be admitted to the hospital.  Patient was brought to the ED at the recommendation of [facility] public health team and [facility] as he will require nursing home placement.  Case management consulted by ED provider.  They think that if the patient is admitted to [facility] campus it will be easier for them to get him placed.  Patient has no complaints.  States he is feeling a lot better since he left the hospital.  Denies shortness of breath, cough, or chest pain.  ED Course: Afebrile.  Blood pressure soft on arrival and improved with a 500 cc fluid bolus.  Not tachycardic.  Not significantly tachypneic.  Oxygen saturation 93-97% on room air.  White blood cell count 10.9.  Lactic acid 2.5.  UA with moderate amount of leukocytes, 21-50 WBCs, and rare bacteria.  Review of Systems:  All systems reviewed and apart from history of presenting illness, are negative.  Past Medical History:  Diagnosis Date  . COPD (chronic obstructive pulmonary disease) (HCC)   . Hypertension     History reviewed. No pertinent surgical history.   reports that he quit smoking about 7 years ago. He has a 70.00 pack-year smoking history. He has never used smokeless tobacco. He reports that he  does not drink alcohol or use drugs.  Allergies  Allergen Reactions  . Codeine   . Penicillins Itching    Did it involve swelling of the face/tongue/throat, SOB, or low BP? No Did it involve sudden or severe rash/hives, skin peeling, or any reaction on the inside of your mouth or nose?Yes Did you need to seek medical attention at a hospital or doctor's office? N/A When did it last happen?N/A If all above answers are "NO", may proceed with cephalosporin use.  . Sulfa Antibiotics Rash    Family History  Problem Relation Age of Onset  . Hypertension Mother   . Diabetes Mother   . CAD Mother   . Colon cancer Father     Prior to Admission medications   Medication Sig Start Date End Date Taking? Authorizing Provider  acetaminophen (TYLENOL) 325 MG tablet Take 650 mg by mouth every 6 (six) hours as needed for mild pain.    [provider]  albuterol (PROVENTIL) (2.5 MG/3ML) 0.083% nebulizer solution Take 3 mLs (2.5 mg total) by nebulization every 6 (six) hours as needed for wheezing or shortness of breath. 09/15/19   Glenford Bayley, NP  albuterol (VENTOLIN HFA) 108 (90 Base) MCG/ACT inhaler Inhale 3 puffs into the lungs every 6 (six) hours as needed for wheezing or shortness of breath.    [provider]  dexamethasone (DECADRON) 2 MG tablet Take 2 tablets once daily for 3 days, then 1 tablet once daily for 3 days, then STOP. 09/30/19   Osvaldo Shipper, MD  famotidine (PEPCID) 20  MG tablet Take 1 tablet (20 mg total) by mouth daily for 10 days. 10/01/19 10/11/19  Bonnielee Haff, MD  furosemide (LASIX) 20 MG tablet Take 2 tablets (40 mg total) by mouth daily. 09/11/19   [name], MD  guaiFENesin-dextromethorphan (ROBITUSSIN DM) 100-10 MG/5ML syrup Take 10 mLs by mouth every 4 (four) hours as needed for cough. 09/30/19   Bonnielee Haff, MD  tamsulosin (FLOMAX) 0.4 MG CAPS capsule Take 1 capsule (0.4 mg total) by mouth daily after supper. 09/30/19   Bonnielee Haff,  MD  umeclidinium-vilanterol [facility] ELLIPTA) 62.5-25 MCG/INH AEPB Inhale 1 puff into the lungs daily. 09/09/19   Kathi Ludwig, MD  vitamin C (VITAMIN C) 500 MG tablet Take 1 tablet (500 mg total) by mouth daily for 10 days. 10/01/19 10/11/19  Bonnielee Haff, MD  zinc sulfate 220 (50 Zn) MG capsule Take 1 capsule (220 mg total) by mouth daily for 10 days. 10/01/19 10/11/19  Bonnielee Haff, MD    Physical Exam: Vitals:   10/03/19 0200 10/03/19 0206 10/03/19 0207 10/03/19 0215  BP: 115/86   107/80  Pulse: 88 87 86 84  Resp: 16 16 16 16   Temp:      TempSrc:      SpO2:  100% 98% 94%    Physical Exam  Constitutional: He is oriented to person, place, and time. He appears well-developed and well-nourished. No distress.  HENT:  Head: Normocephalic.  Eyes: Right eye exhibits no discharge. Left eye exhibits no discharge.  Neck: Neck supple.  Cardiovascular: Normal rate, regular rhythm and intact distal pulses.  Pulmonary/Chest: Effort normal. No respiratory distress. He has no wheezes. He has no rales.  Abdominal: Soft. Bowel sounds are normal. He exhibits no distension. There is no abdominal tenderness. There is no guarding.  Musculoskeletal:     Comments: +1 pitting edema of bilateral lower extremities  Neurological: He is alert and oriented to person, place, and time.  Skin: Skin is warm and dry. He is not diaphoretic.     Labs on Admission: I have personally reviewed following labs and imaging studies  CBC: Recent Labs  Lab 09/26/19 0339 09/27/19 0337 09/29/19 0320 10/02/19 2016  WBC 7.2 5.5 7.1 10.9*  NEUTROABS 5.9 4.3  --  9.0*  HGB 12.1* 12.2* 12.5* 14.2  HCT 39.6 38.8* 39.8 44.5  MCV 94.1 90.9 90.9 89.4  PLT 289 313 342 161   Basic Metabolic Panel: Recent Labs  Lab 09/26/19 0339 09/27/19 0337 09/29/19 0320 09/30/19 0303 10/02/19 2016  NA 139 139 138 136 134*  K 4.5 4.5 4.5 4.7 4.9  CL 100 100 95* 95* 97*  CO2 30 29 31 31 26   GLUCOSE 240* 155* 103* 139*  156*  BUN 39* 33* 32* 38* 38*  CREATININE 1.04 0.92 1.00 1.03 1.16  CALCIUM 8.7* 8.4* 8.8* 8.9 8.8*  MG 2.0 2.0  --   --   --    GFR: Estimated Creatinine Clearance: 60.8 mL/min (by C-G formula based on SCr of 1.16 mg/dL). Liver Function Tests: Recent Labs  Lab 09/26/19 0339 09/27/19 0337 09/29/19 0320 10/02/19 2016  AST 172* 55* 24 22  ALT 184* 129* 78* 53*  ALKPHOS 57 60 60 68  BILITOT 0.3 0.4 1.0 0.6  PROT 5.1* 5.3* 5.6* 6.2*  ALBUMIN 2.1* 2.4* 2.5* 2.6*   No results for input(s): LIPASE, AMYLASE in the last 168 hours. No results for input(s): AMMONIA in the last 168 hours. Coagulation Profile: No results for input(s): INR, PROTIME in the last  168 hours. Cardiac Enzymes: No results for input(s): CKTOTAL, CKMB, CKMBINDEX, TROPONINI in the last 168 hours. BNP (last 3 results) No results for input(s): PROBNP in the last 8760 hours. HbA1C: No results for input(s): HGBA1C in the last 72 hours. CBG: Recent Labs  Lab 09/29/19 1636 09/29/19 2133 09/30/19 0732 09/30/19 1121 10/03/19 0158  GLUCAP 301* 393* 149* 212* 175*   Lipid Profile: No results for input(s): CHOL, HDL, LDLCALC, TRIG, CHOLHDL, LDLDIRECT in the last 72 hours. Thyroid Function Tests: No results for input(s): TSH, T4TOTAL, FREET4, T3FREE, THYROIDAB in the last 72 hours. Anemia Panel: No results for input(s): VITAMINB12, FOLATE, FERRITIN, TIBC, IRON, RETICCTPCT in the last 72 hours. Urine analysis:    Component Value Date/Time   COLORURINE YELLOW 10/02/2019 2013   APPEARANCEUR HAZY (A) 10/02/2019 2013   LABSPEC 1.025 10/02/2019 2013   PHURINE 6.0 10/02/2019 2013   GLUCOSEU NEGATIVE 10/02/2019 2013   HGBUR NEGATIVE 10/02/2019 2013   BILIRUBINUR NEGATIVE 10/02/2019 2013   KETONESUR NEGATIVE 10/02/2019 2013   PROTEINUR NEGATIVE 10/02/2019 2013   NITRITE NEGATIVE 10/02/2019 2013   LEUKOCYTESUR MODERATE (A) 10/02/2019 2013    Radiological Exams on Admission: No results found.  EKG: Independently  reviewed.  Sinus rhythm, LAFB similar to prior tracing.  Assessment/Plan Principal Problem:   COVID-19 Active Problems:   Chronic respiratory failure with hypoxia (HCC)   COPD exacerbation (HCC)   HTN (hypertension)   UTI (urinary tract infection)   Recent COVID-19 infection Patient was admitted to the hospital from 10/11-10/20 for acute hypoxic respiratory failure secondary to COVID-19 pneumonia.  He was treated with remdesivir and steroids.  In addition, received convalescent plasma.  At present, patient has no respiratory complaints.  He uses 3 L supplemental oxygen chronically for COPD and currently is not requiring more oxygen than his baseline.  No signs of respiratory distress.  [facility] is placement to SNF.  Patient's primary caregiver is his son who has been admitted today for COVID-19 viral infection. Patient was brought to the ED at the recommendation of [facility] team and [facility] as he will require nursing home placement. Case management consulted by ED provider.  They think that if the [facility] is [facility] it will be easier for them to get him placed. -[facility] -Case management on board -Continue supplemental oxygen for COPD  Possible UTI Afebrile.  White blood cell count 10.9.  UA with moderate amount of leukocytes, 21-50 WBCs, and rare bacteria.  Patient is not endorsing any UTI symptoms.  Does have mild lactic acidosis, suspect related to dehydration.  Lactic acidosis resolved after a 500 cc fluid bolus.  No other signs of sepsis. -Ceftriaxone (allergy to penicillins is itching only) -Benadryl as needed for itching -Urine culture  Chronic hypoxic respiratory failure secondary to COPD -Stable.  No wheezing or shortness of breath.  No change in home oxygen requirement. -Continue home supplemental oxygen -DuoNebs as needed  Hypertension Blood pressure was soft upon arrival to  the ED.  Now improved after a 500 cc fluid bolus.  Hold off giving antihypertensives at this time.  Chronic diastolic congestive heart failure Has mild lower extremity edema but no signs of respiratory distress.  No change in home oxygen requirement.  Hold off giving Lasix at this time given soft blood pressure earlier upon arrival to the ED.  Lasix can be given in the morning if blood pressure continues to be stable.  Non-insulin-dependent diabetes mellitus A1c 6.5 on 9/24. -Sliding scale insulin sensitive and CBG checks.  DVT prophylaxis: Lovenox Code Status: DNR Family Communication: No family at bedside. Disposition Plan: Anticipate discharge to SNF. Consults called: None Admission status: It is my clinical opinion that referral for OBSERVATION is reasonable and necessary in this patient based on the above information provided. The aforementioned taken together are felt to place the patient at high risk for further clinical deterioration. However it is anticipated that the patient may be medically stable for discharge from the hospital within 24 to 48 hours.  The medical decision making on this patient was of high complexity and the patient is at high risk for clinical deterioration, therefore this is a level 3 visit.  John Giovanni MD [facility] Pager [phone]  If 7PM-7AM, please contact night-coverage [email or web address] Password [email or web address]  10/03/2019, 3:10 AM

## 2019-10-02 NOTE — Progress Notes (Signed)
CSW has reviewed the chart and is working with [facility] in determining safe disposition. CSW in contact with Hinton Dyer at [facility] who confirms that they have beds and are accepting COVID+ patients.   CSW and RNCM in contact with patients son, Aaron Daniels who is also a patient [facility] at [facility]. Aaron Daniels was instructed by [facility] to bring himself and parents to [facility] as he cannot manage his symptoms and caring for his parents who are also COVID+.   CSW will begin working patient up for SNF as Hinton Dyer from [facility] states she will review their referral on tomorrow.  TOC team will continue to follow patient.  [place] [facility]  Clinical Social Worker  Ph: [phone]

## 2019-10-02 NOTE — NC FL2 (Signed)
[place] MEDICAID FL2 LEVEL OF CARE SCREENING TOOL     IDENTIFICATION  Patient Name: Aaron Daniels Birthdate: May 06, 1941 Sex: male Admission Date (Current Location): 10/02/2019  [facility] and [place] Number:  Producer, television/film/video and Address:  [facility], [address] [place], [place] [address]      Provider Number: [id number]  Attending Physician Name and Address:  Derwood Kaplan, MD  Relative Name and Phone Number:  [facility]jae [facility]: [phone]    Current Level of Care: Hospital Recommended Level of Care: Skilled Nursing Facility Prior Approval Number:    Date Approved/Denied:   PASRR Number:    Discharge Plan: SNF    Current Diagnoses: Patient Active Problem List   Diagnosis Date Noted  . Hypoxia 09/21/2019  . COVID-19 09/21/2019  . Diarrhea 09/17/2019  . Snoring 09/15/2019  . COPD (chronic obstructive pulmonary disease) (HCC) 09/11/2019  . HTN (hypertension) 09/11/2019  . Heart failure with preserved ejection fraction (HCC) 09/07/2019  . COPD exacerbation (HCC) 09/07/2019  . Dyspnea 09/04/2019  . Chronic respiratory failure with hypoxia (HCC) 09/03/2019    Orientation RESPIRATION BLADDER Height & Weight     Self, Time, Situation, Place  Normal Continent Weight:   Height:     BEHAVIORAL SYMPTOMS/MOOD NEUROLOGICAL BOWEL NUTRITION STATUS      Continent Diet  AMBULATORY STATUS COMMUNICATION OF NEEDS Skin   Supervision Verbally Normal, Skin abrasions(leg)                       Personal Care Assistance Level of Assistance  Bathing, Feeding, Dressing Bathing Assistance: Limited assistance Feeding assistance: Independent Dressing Assistance: Limited assistance     Functional Limitations Info  Sight, Hearing, Speech Sight Info: Adequate Hearing Info: Adequate Speech Info: Adequate    SPECIAL CARE FACTORS FREQUENCY  PT (By licensed PT), OT (By licensed OT)     PT Frequency: 3x weekly OT Frequency: 3x weekly             Contractures Contractures Info: Not present    Additional Factors Info  Code Status, Allergies Code Status Info: DNR Allergies Info: Codeine, Penicillins, Sulfa Antibiotics           Current Medications (10/02/2019):  This is the current hospital active medication list Current Facility-Administered Medications  Medication Dose Route Frequency Provider Last Rate Last Dose  . lactated ringers infusion   Intravenous Continuous Derwood Kaplan, MD 125 mL/hr at 10/02/19 2141     Current Outpatient Medications  Medication Sig Dispense Refill  . acetaminophen (TYLENOL) 325 MG tablet Take 650 mg by mouth every 6 (six) hours as needed for mild pain.    Marland Kitchen albuterol (PROVENTIL) (2.5 MG/3ML) 0.083% nebulizer solution Take 3 mLs (2.5 mg total) by nebulization every 6 (six) hours as needed for wheezing or shortness of breath. 75 mL 1  . albuterol (VENTOLIN HFA) 108 (90 Base) MCG/ACT inhaler Inhale 3 puffs into the lungs every 6 (six) hours as needed for wheezing or shortness of breath.    . dexamethasone (DECADRON) 2 MG tablet Take 2 tablets once daily for 3 days, then 1 tablet once daily for 3 days, then STOP. 9 tablet 0  . famotidine (PEPCID) 20 MG tablet Take 1 tablet (20 mg total) by mouth daily for 10 days. 10 tablet 0  . furosemide (LASIX) 20 MG tablet Take 2 tablets (40 mg total) by mouth daily. 90 tablet 0  . guaiFENesin-dextromethorphan (ROBITUSSIN DM) 100-10 MG/5ML syrup Take 10  mLs by mouth every 4 (four) hours as needed for cough. 118 mL 0  . tamsulosin (FLOMAX) 0.4 MG CAPS capsule Take 1 capsule (0.4 mg total) by mouth daily after supper. 30 capsule 1  . umeclidinium-vilanterol (ANORO ELLIPTA) 62.5-25 MCG/INH AEPB Inhale 1 puff into the lungs daily. 1 each 2  . vitamin C (VITAMIN C) 500 MG tablet Take 1 tablet (500 mg total) by mouth daily for 10 days. 10 tablet 0  . zinc sulfate 220 (50 Zn) MG capsule Take 1 capsule (220 mg total) by mouth daily for 10 days. 10 capsule 0      Discharge Medications: Please see discharge summary for a list of discharge medications.  Relevant Imaging Results:  Relevant Lab Results:   Additional Information SS# [id number]  [place], LCSW

## 2019-10-02 NOTE — ED Notes (Signed)
Pt placed on monitor and in bed.

## 2019-10-02 NOTE — ED Provider Notes (Signed)
[facility] EMERGENCY [facility]PARTMENT Provider Note   CSN: [id number] Arrival date & time: 10/02/19  1818     History   Chief Complaint Chief Complaint  Patient presents with  . Covid (+)    09/21/2019    HPI Jearld PiesWilliam Alcalde is a 78 y.o. male.     HPI  78 year old with history of COPD comes in a chief complaint of COVID-19 infection.  He was diagnosed with COVID-19 few days ago.  He was admitted to the hospital and [facility].  His son, who is primarily taking care of both him and his wife has gotten much sicker and is going to be admitted to the hospital today.  He had contacted [facility] and they had requested that patient bring his parents year to the hospital and they will start looking for placement.   Patient has no complaints from his side from COVID-19 perspective.  He reports that he is using oxygen more than he was prior to the diagnosis, but is in general so able to take care of himself.  He does not have any dementia.  Past Medical History:  Diagnosis Date  . COPD (chronic obstructive pulmonary disease) (HCC)   . Hypertension     Patient Active Problem List   Diagnosis Date Noted  . Hypoxia 09/21/2019  . COVID-19 09/21/2019  . Diarrhea 09/17/2019  . Snoring 09/15/2019  . COPD (chronic obstructive pulmonary disease) (HCC) 09/11/2019  . HTN (hypertension) 09/11/2019  . Heart failure with preserved ejection fraction (HCC) 09/07/2019  . COPD exacerbation (HCC) 09/07/2019  . Dyspnea 09/04/2019  . Chronic respiratory failure with hypoxia (HCC) 09/03/2019    No past surgical history on file.      Home Medications    Prior to Admission medications   Medication Sig Start Date End Date Taking? Authorizing Provider  acetaminophen (TYLENOL) 325 MG tablet Take 650 mg by mouth every 6 (six) hours as needed for mild pain.    [provider]  albuterol (PROVENTIL) (2.5 MG/3ML) 0.083%  nebulizer solution Take 3 mLs (2.5 mg total) by nebulization every 6 (six) hours as needed for wheezing or shortness of breath. 09/15/19   Glenford BayleyWalsh, Elizabeth W, NP  albuterol (VENTOLIN HFA) 108 (90 Base) MCG/ACT inhaler Inhale 3 puffs into the lungs every 6 (six) hours as needed for wheezing or shortness of breath.    [provider]  dexamethasone (DECADRON) 2 MG tablet Take 2 tablets once daily for 3 days, then 1 tablet once daily for 3 days, then STOP. 09/30/19   Osvaldo ShipperKrishnan, Gokul, MD  famotidine (PEPCID) 20 MG tablet Take 1 tablet (20 mg total) by mouth daily for 10 days. 10/01/19 10/11/19  Osvaldo ShipperKrishnan, Gokul, MD  furosemide (LASIX) 20 MG tablet Take 2 tablets (40 mg total) by mouth daily. 09/11/19   Albertha GheeSteen, James, MD  guaiFENesin-dextromethorphan (ROBITUSSIN DM) 100-10 MG/5ML syrup Take 10 mLs by mouth every 4 (four) hours as needed for cough. 09/30/19   Osvaldo ShipperKrishnan, Gokul, MD  tamsulosin (FLOMAX) 0.4 MG CAPS capsule Take 1 capsule (0.4 mg total) by mouth daily after supper. 09/30/19   Osvaldo ShipperKrishnan, Gokul, MD  umeclidinium-vilanterol [facility](ANORO ELLIPTA) 62.5-25 MCG/INH AEPB Inhale 1 puff into the lungs daily. 09/09/19   Lanelle BalHarbrecht, Lawrence, MD  vitamin C (VITAMIN C) 500 MG tablet Take 1 tablet (500 mg total) by mouth daily for 10 days. 10/01/19 10/11/19  Osvaldo ShipperKrishnan, Gokul, MD  zinc sulfate 220 (50 Zn) MG capsule Take 1 capsule (220  mg total) by mouth daily for 10 days. 10/01/19 10/11/19  Osvaldo Shipper, MD    Family History Family History  Problem Relation Age of Onset  . Hypertension Mother   . Diabetes Mother   . CAD Mother   . Colon cancer Father     Social History Social History   Tobacco Use  . Smoking status: Former Smoker    Packs/day: 1.25    Years: 56.00    Pack years: 70.00    Quit date: 07/13/2012    Years since quitting: 7.2  . Smokeless tobacco: Never Used  Substance Use Topics  . Alcohol use: Never    Frequency: Never  . Drug use: Never     Allergies   Codeine, Penicillins,  and Sulfa antibiotics   Review of Systems Review of Systems  Constitutional: Negative for activity change.  Respiratory: Positive for cough. Negative for shortness of breath.   Cardiovascular: Negative for chest pain.  Hematological: Does not bruise/bleed easily.  All other systems reviewed and are negative.    Physical Exam Updated Vital Signs BP 112/89   Pulse 88   Temp 98.8 F (37.1 C) (Oral)   Resp 18   SpO2 95%   Physical Exam Vitals signs and nursing note reviewed.  Constitutional:      Appearance: He is well-developed.  HENT:     Head: Atraumatic.  Neck:     Musculoskeletal: Neck supple.  Cardiovascular:     Rate and Rhythm: Normal rate.  Pulmonary:     Effort: Pulmonary effort is normal.  Skin:    General: Skin is warm.  Neurological:     Mental Status: He is alert and oriented to person, place, and time.      ED Treatments / Results  Labs (all labs ordered are listed, but only abnormal results are displayed) Labs Reviewed  LACTIC ACID, PLASMA - Abnormal; Notable for the following components:      Result Value   Lactic Acid, Venous 2.5 (*)    All other components within normal limits  COMPREHENSIVE METABOLIC PANEL - Abnormal; Notable for the following components:   Sodium 134 (*)    Chloride 97 (*)    Glucose, Bld 156 (*)    BUN 38 (*)    Calcium 8.8 (*)    Total Protein 6.2 (*)    Albumin 2.6 (*)    ALT 53 (*)    GFR calc non Af Amer 59 (*)    All other components within normal limits  CBC WITH DIFFERENTIAL/PLATELET - Abnormal; Notable for the following components:   WBC 10.9 (*)    RDW 16.0 (*)    Neutro Abs 9.0 (*)    Abs Immature Granulocytes 0.22 (*)    All other components within normal limits  URINALYSIS, ROUTINE W REFLEX MICROSCOPIC - Abnormal; Notable for the following components:   APPearance HAZY (*)    Leukocytes,Ua MODERATE (*)    Bacteria, UA RARE (*)    All other components within normal limits  LACTIC ACID, PLASMA     EKG EKG Interpretation  Date/Time:  Thursday October 02 2019 20:14:00 EDT Ventricular Rate:  95 PR Interval:  138 QRS Duration: 72 QT Interval:  342 QTC Calculation: 429 R Axis:   -60 Text Interpretation:  Normal sinus rhythm Left anterior fascicular block Inferior infarct , age undetermined Abnormal ECG No acute changes No significant change since last tracing Confirmed by Derwood Kaplan [id number]) on 10/02/2019 9:06:26 PM   Radiology No results  found.  Procedures Procedures (including critical care time)  Medications Ordered in ED Medications  lactated ringers infusion ( Intravenous New Bag/Given 10/02/19 2141)  sodium chloride 0.9 % bolus 500 mL (500 mLs Intravenous New Bag/Given 10/02/19 2141)     Initial Impression / Assessment and Plan / ED Course  I have reviewed the triage vital signs and the nursing notes.  Pertinent labs & imaging results that were available during my care of the patient were reviewed by me and considered in my medical decision making (see chart for details).       78 year old with COPD has COVID-19.  He recovered well enough from his infection to be discharged few days ago.  He has home health and home PT set up.  His son, who is the primary caretaker has gotten much sicker with his COVID-35 and and requirement for admission.  He brought his parents to the ER at the recommendation of [facility] team and Adult [employer] recommendations.  It appears that we will have to seek placement.  Patient feels comfortable, but the son is unsure if it is in the best interest of his father to be alone.  Case management consulted.  They think if patient is admitted to [facility] it will be easier for them to get him placed.   Keisean Skowron was evaluated in Emergency Department on 10/02/2019 for the symptoms described in the history of present illness. He was evaluated in the context of the global COVID-19 pandemic, which  necessitated consideration that the patient might be at risk for infection with the SARS-CoV-2 virus that causes COVID-19. Institutional protocols and algorithms that pertain to the evaluation of patients at risk for COVID-19 are in a state of rapid change based on information released by regulatory bodies including the CDC and federal and state organizations. These policies and algorithms were followed during the patient's care in the ED.     Final Clinical Impressions(s) / ED Diagnoses   Final diagnoses:  COVID-19 virus infection    ED Discharge Orders    None       Varney Biles, MD 10/02/19 2326

## 2019-10-02 NOTE — ED Triage Notes (Addendum)
Pt's son brought his parents in because he is not able to take care of his Covid positive as well as his himself. [facility] Dept and APS advise the son to bring parents here for care. Pt states he feels better but he states he is unable to take care of himself and his wife. Pt is currently asymptomatic. Has CHF and has recently been placed on medication for fluid retention.

## 2019-10-03 ENCOUNTER — Encounter (HOSPITAL_COMMUNITY): Payer: Self-pay | Admitting: Internal Medicine

## 2019-10-03 DIAGNOSIS — M2550 Pain in unspecified joint: Secondary | ICD-10-CM | POA: Diagnosis not present

## 2019-10-03 DIAGNOSIS — G4733 Obstructive sleep apnea (adult) (pediatric): Secondary | ICD-10-CM | POA: Diagnosis present

## 2019-10-03 DIAGNOSIS — Z8249 Family history of ischemic heart disease and other diseases of the circulatory system: Secondary | ICD-10-CM | POA: Diagnosis not present

## 2019-10-03 DIAGNOSIS — I10 Essential (primary) hypertension: Secondary | ICD-10-CM

## 2019-10-03 DIAGNOSIS — Z79899 Other long term (current) drug therapy: Secondary | ICD-10-CM | POA: Diagnosis not present

## 2019-10-03 DIAGNOSIS — E119 Type 2 diabetes mellitus without complications: Secondary | ICD-10-CM | POA: Diagnosis present

## 2019-10-03 DIAGNOSIS — R0602 Shortness of breath: Secondary | ICD-10-CM | POA: Diagnosis not present

## 2019-10-03 DIAGNOSIS — U071 COVID-19: Secondary | ICD-10-CM | POA: Diagnosis not present

## 2019-10-03 DIAGNOSIS — Z87891 Personal history of nicotine dependence: Secondary | ICD-10-CM | POA: Diagnosis not present

## 2019-10-03 DIAGNOSIS — E872 Acidosis: Secondary | ICD-10-CM | POA: Diagnosis present

## 2019-10-03 DIAGNOSIS — N390 Urinary tract infection, site not specified: Secondary | ICD-10-CM | POA: Diagnosis present

## 2019-10-03 DIAGNOSIS — Z882 Allergy status to sulfonamides status: Secondary | ICD-10-CM | POA: Diagnosis not present

## 2019-10-03 DIAGNOSIS — Z7401 Bed confinement status: Secondary | ICD-10-CM | POA: Diagnosis not present

## 2019-10-03 DIAGNOSIS — Z880 Allergy status to penicillin: Secondary | ICD-10-CM | POA: Diagnosis not present

## 2019-10-03 DIAGNOSIS — Z9981 Dependence on supplemental oxygen: Secondary | ICD-10-CM | POA: Diagnosis not present

## 2019-10-03 DIAGNOSIS — Z751 Person awaiting admission to adequate facility elsewhere: Secondary | ICD-10-CM | POA: Diagnosis not present

## 2019-10-03 DIAGNOSIS — Z23 Encounter for immunization: Secondary | ICD-10-CM | POA: Diagnosis not present

## 2019-10-03 DIAGNOSIS — Z66 Do not resuscitate: Secondary | ICD-10-CM | POA: Diagnosis present

## 2019-10-03 DIAGNOSIS — Z885 Allergy status to narcotic agent status: Secondary | ICD-10-CM | POA: Diagnosis not present

## 2019-10-03 DIAGNOSIS — Z743 Need for continuous supervision: Secondary | ICD-10-CM | POA: Diagnosis not present

## 2019-10-03 DIAGNOSIS — I110 Hypertensive heart disease with heart failure: Secondary | ICD-10-CM | POA: Diagnosis present

## 2019-10-03 DIAGNOSIS — Z833 Family history of diabetes mellitus: Secondary | ICD-10-CM | POA: Diagnosis not present

## 2019-10-03 DIAGNOSIS — R0902 Hypoxemia: Secondary | ICD-10-CM | POA: Diagnosis not present

## 2019-10-03 DIAGNOSIS — E860 Dehydration: Secondary | ICD-10-CM | POA: Diagnosis not present

## 2019-10-03 DIAGNOSIS — Z9119 Patient's noncompliance with other medical treatment and regimen: Secondary | ICD-10-CM | POA: Diagnosis not present

## 2019-10-03 DIAGNOSIS — I5032 Chronic diastolic (congestive) heart failure: Secondary | ICD-10-CM | POA: Diagnosis present

## 2019-10-03 DIAGNOSIS — I444 Left anterior fascicular block: Secondary | ICD-10-CM | POA: Diagnosis present

## 2019-10-03 DIAGNOSIS — J449 Chronic obstructive pulmonary disease, unspecified: Secondary | ICD-10-CM | POA: Diagnosis not present

## 2019-10-03 DIAGNOSIS — J9611 Chronic respiratory failure with hypoxia: Secondary | ICD-10-CM | POA: Diagnosis not present

## 2019-10-03 LAB — GLUCOSE, CAPILLARY
Glucose-Capillary: 122 mg/dL — ABNORMAL HIGH (ref 70–99)
Glucose-Capillary: 204 mg/dL — ABNORMAL HIGH (ref 70–99)
Glucose-Capillary: 148 mg/dL — ABNORMAL HIGH (ref 70–99)
Glucose-Capillary: 196 mg/dL — ABNORMAL HIGH (ref 70–99)

## 2019-10-03 LAB — LACTIC ACID, PLASMA: Lactic Acid, Venous: 1.3 mmol/L (ref 0.5–1.9)

## 2019-10-03 LAB — BRAIN NATRIURETIC PEPTIDE: B Natriuretic Peptide: 41.5 pg/mL (ref 0.0–100.0)

## 2019-10-03 LAB — CBG MONITORING, ED: Glucose-Capillary: 175 mg/dL — ABNORMAL HIGH (ref 70–99)

## 2019-10-03 MED ORDER — SODIUM CHLORIDE 0.9 % IV SOLN
1.0000 g | Freq: Every day | INTRAVENOUS | Status: DC
  Administered 2019-10-03 – 2019-10-09 (×8): 1 g via INTRAVENOUS
  Filled 2019-10-03 (×8): qty 10

## 2019-10-03 MED ORDER — ENOXAPARIN SODIUM 40 MG/0.4ML ~~LOC~~ SOLN
40.0000 mg | Freq: Every day | SUBCUTANEOUS | Status: DC
  Administered 2019-10-03 – 2019-10-10 (×8): 40 mg via SUBCUTANEOUS
  Filled 2019-10-03 (×8): qty 0.4

## 2019-10-03 MED ORDER — INSULIN ASPART 100 UNIT/ML ~~LOC~~ SOLN
0.0000 [IU] | Freq: Three times a day (TID) | SUBCUTANEOUS | Status: DC
  Administered 2019-10-03: 1 [IU] via SUBCUTANEOUS
  Administered 2019-10-03: 3 [IU] via SUBCUTANEOUS
  Administered 2019-10-03: 09:00:00 1 [IU] via SUBCUTANEOUS
  Administered 2019-10-04 (×2): 3 [IU] via SUBCUTANEOUS
  Administered 2019-10-05: 2 [IU] via SUBCUTANEOUS
  Administered 2019-10-05: 3 [IU] via SUBCUTANEOUS
  Administered 2019-10-05 – 2019-10-06 (×2): 1 [IU] via SUBCUTANEOUS
  Administered 2019-10-06 – 2019-10-07 (×2): 2 [IU] via SUBCUTANEOUS
  Administered 2019-10-07 – 2019-10-08 (×3): 3 [IU] via SUBCUTANEOUS
  Administered 2019-10-08: 1 [IU] via SUBCUTANEOUS
  Administered 2019-10-09: 3 [IU] via SUBCUTANEOUS
  Administered 2019-10-09: 1 [IU] via SUBCUTANEOUS
  Administered 2019-10-10 (×2): 2 [IU] via SUBCUTANEOUS
  Administered 2019-10-10: 1 [IU] via SUBCUTANEOUS

## 2019-10-03 MED ORDER — ALBUTEROL SULFATE HFA 108 (90 BASE) MCG/ACT IN AERS
2.0000 | INHALATION_SPRAY | Freq: Four times a day (QID) | RESPIRATORY_TRACT | Status: DC | PRN
  Filled 2019-10-03: qty 6.7

## 2019-10-03 MED ORDER — DIPHENHYDRAMINE HCL 50 MG/ML IJ SOLN: 12.5000 mg | Freq: Four times a day (QID) | INTRAMUSCULAR | Status: DC | PRN

## 2019-10-03 MED ORDER — UMECLIDINIUM-VILANTEROL 62.5-25 MCG/INH IN AEPB
1.0000 | INHALATION_SPRAY | Freq: Every day | RESPIRATORY_TRACT | Status: DC
  Administered 2019-10-03 – 2019-10-10 (×8): 1 via RESPIRATORY_TRACT
  Filled 2019-10-03 (×2): qty 14

## 2019-10-03 MED ORDER — ACETAMINOPHEN 650 MG RE SUPP: 650.0000 mg | Freq: Four times a day (QID) | RECTAL | Status: DC | PRN

## 2019-10-03 MED ORDER — FAMOTIDINE 20 MG PO TABS
20.0000 mg | ORAL_TABLET | Freq: Every day | ORAL | Status: DC
  Administered 2019-10-03 – 2019-10-10 (×8): 20 mg via ORAL
  Filled 2019-10-03 (×8): qty 1

## 2019-10-03 MED ORDER — IPRATROPIUM-ALBUTEROL 0.5-2.5 (3) MG/3ML IN SOLN
3.0000 mL | Freq: Four times a day (QID) | RESPIRATORY_TRACT | Status: DC | PRN
  Filled 2019-10-03: qty 3

## 2019-10-03 MED ORDER — TAMSULOSIN HCL 0.4 MG PO CAPS
0.4000 mg | ORAL_CAPSULE | Freq: Every day | ORAL | Status: DC
  Administered 2019-10-03 – 2019-10-10 (×8): 0.4 mg via ORAL
  Filled 2019-10-03 (×8): qty 1

## 2019-10-03 MED ORDER — ORAL CARE MOUTH RINSE
15.0000 mL | Freq: Two times a day (BID) | OROMUCOSAL | Status: DC
  Administered 2019-10-03 – 2019-10-10 (×14): 15 mL via OROMUCOSAL

## 2019-10-03 MED ORDER — INSULIN ASPART 100 UNIT/ML ~~LOC~~ SOLN
0.0000 [IU] | Freq: Every day | SUBCUTANEOUS | Status: DC
  Administered 2019-10-04 – 2019-10-05 (×2): 2 [IU] via SUBCUTANEOUS

## 2019-10-03 MED ORDER — FUROSEMIDE 20 MG PO TABS
40.0000 mg | ORAL_TABLET | Freq: Every day | ORAL | Status: DC
  Administered 2019-10-03 – 2019-10-05 (×3): 40 mg via ORAL
  Filled 2019-10-03 (×3): qty 2

## 2019-10-03 MED ORDER — ACETAMINOPHEN 325 MG PO TABS
650.0000 mg | ORAL_TABLET | Freq: Four times a day (QID) | ORAL | Status: DC | PRN
  Administered 2019-10-09: 650 mg via ORAL
  Filled 2019-10-03: qty 2

## 2019-10-03 NOTE — ED Notes (Signed)
Tele [facility]  Called [facility], stated would be after 7am

## 2019-10-03 NOTE — Evaluation (Signed)
Physical Therapy Evaluation Patient Details Name: Aaron Daniels MRN: [id number] DOB: 11/08/1941 Today's Date: 10/03/2019   History of Present Illness  78 y/o male recently d/c home on 09/28 then 10/20  after hospitalization w/ hypoxic respiratory failure. returned to ED w/ worsening SOB, admitted with acute CHF exacerbation then found to be COVID +PMHx: COPD on 3L/min at home, HTN, BPH s/p TURP, CHF  Clinical Impression   Pt re-admitted ( 3rd admission in last month) with above diagnosis. PTA was home and I, taking care of spouse. after last hospitalization returned home again to find his wife and son both ill. All three are now hospitalized. Pt currently with functional limitations due to the deficits listed below (see PT Problem List) along with decreased activity tolerance and independence. Pt will benefit from skilled PT to increase his independence and safety with mobility to allow discharge to the venue listed below.       Follow Up Recommendations Home health PT    Equipment Recommendations  None recommended by PT    Recommendations for Other Services       Precautions / Restrictions Precautions Precautions: Fall Restrictions Weight Bearing Restrictions: No      Mobility  Bed Mobility Overal bed mobility: Modified Independent Bed Mobility: Rolling;Supine to Sit;Sit to Supine     Supine to sit: Modified independent (Device/Increase time) Sit to supine: Modified independent (Device/Increase time)      Transfers Overall transfer level: Needs assistance Equipment used: None Transfers: Sit to/from [facility] Sit to Stand: Supervision Stand pivot transfers: Supervision       General transfer comment: needs line assist  Ambulation/Gait Ambulation/Gait assistance: Supervision Gait Distance (Feet): 88 Feet Assistive device: None Gait Pattern/deviations: Step-through pattern;Decreased stride length;Trunk flexed Gait velocity: decr   General Gait  Details: on 2L/min via HFNC and sats in 90s at rest with ambulation in room with no AD and SBA sats decrease to min 86% read on pedi finger probe.  Stairs            Wheelchair Mobility    Modified Rankin (Stroke Patients Only)       Balance Overall balance assessment: Needs assistance Sitting-balance support: Feet supported Sitting balance-Leahy Scale: Good     Standing balance support: During functional activity;No upper extremity supported Standing balance-Leahy Scale: Good                               Pertinent Vitals/Pain Pain Assessment: No/denies pain    Home Living Family/patient expects to be discharged to:: Private residence Living Arrangements: Spouse/significant other Available Help at Discharge: Family;Other (Comment) Type of Home: House Home Access: Stairs to enter   Entrance Stairs-Number of Steps: 2 Home Layout: One level Home Equipment: Bedside commode;Shower seat;Walker - 2 wheels      Prior Function Level of Independence: Independent         Comments: 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 Reports was home with spouse and his son and all three are now admitted to hospital with COVID      General Comments General comments (skin integrity, edema, etc.): Pt seems closer to baseline than  previous admission, this is 3rd admission in past month. States was d/c home iwth family but all three have COVID, with sone being sicker than he is. all theree admitted to hospital. functionally does well needing SBA and line management for mobility in room. was able to ambulate approx 56ft with no AD. sats remain in 90s desat to 86% with ambulation HR max 118bpm with ambulation.    Exercises     Assessment/Plan    PT Assessment Patient needs continued PT services(while in hospital)  PT Problem List Decreased activity tolerance;Decreased balance;Decreased mobility;Decreased coordination;Decreased safety awareness       PT Treatment Interventions Gait training;Functional mobility training;Therapeutic activities;Therapeutic exercise;Neuromuscular re-education;Patient/family education    PT Goals (Current goals can be found in the Care Plan section)  Acute Rehab PT Goals Patient Stated Goal: go home  and be able to move as previous Time For Goal Achievement: 10/24/19 Potential to Achieve Goals: Good    Frequency Min 3X/week   Barriers to discharge   spouse and son both hospitalized with him    Co-evaluation               AM-PAC PT "6 Clicks" Mobility  Outcome Measure Help needed turning from your back to your side while in a flat bed without using bedrails?: None Help needed moving from lying on your back to sitting on the side of a flat bed without using bedrails?: None Help needed moving to and from a bed to a chair (including a wheelchair)?: None Help needed standing up from a chair using your arms (e.g., wheelchair or bedside chair)?: None Help needed to walk in hospital room?: A Little Help needed climbing 3-5 steps with a railing? : A Lot 6 Click Score: 21    End of Session Equipment Utilized During Treatment: Oxygen Activity Tolerance: Patient tolerated treatment well Patient left: in chair;with call bell/phone within reach   PT Visit Diagnosis: Unsteadiness on  feet (R26.81);Other abnormalities of gait and mobility (R26.89);Muscle weakness (generalized) (M62.81)    Time: 8016-5537 PT Time Calculation (min) (ACUTE ONLY): 26 min   Charges:   PT Evaluation $PT Eval Moderate Complexity: 1 Mod PT Treatments $Gait Training: 8-22 mins        Drema Pry, PT   Freddi Starr 10/03/2019, 2:05 PM

## 2019-10-03 NOTE — ED Notes (Signed)
ED TO INPATIENT HANDOFF REPORT  ED Nurse Name and Phone #: Annie Main [phone]  S Name/Age/Gender Aaron Daniels 78 y.o. male Room/Bed: 012C/012C  Code Status   Code Status: Prior  Home/SNF/Other Home Patient oriented to: self, place, time and situation Is this baseline? Yes   Triage Complete: Triage complete  Chief Complaint COVID+  Triage Note Pt's son brought his parents in because he is not able to take care of his Covid positive as well as his himself. [facility] Dept and APS advise the son to bring parents here for care. Pt states he feels better but he states he is unable to take care of himself and his wife. Pt is currently asymptomatic. Has CHF and has recently been placed on medication for fluid retention.   Allergies Allergies  Allergen Reactions  . Codeine   . Penicillins Itching    Did it involve swelling of the face/tongue/throat, SOB, or low BP? No Did it involve sudden or severe rash/hives, skin peeling, or any reaction on the inside of your mouth or nose?Yes Did you need to seek medical attention at a hospital or doctor's office? N/A When did it last happen?N/A If all above answers are "NO", may proceed with cephalosporin use.  . Sulfa Antibiotics Rash    Level of Care/Admitting Diagnosis ED Disposition    ED Disposition Condition [facility] Area: [place] [[id number]]  Level of Care: Telemetry [5]  Covid Evaluation: Confirmed COVID Positive  Diagnosis: COVID-19 virus infection [[id number]]  Admitting Physician: Shela Leff [[id number]]  Attending Physician: Shela Leff [[id number]]  PT Class (Do Not Modify): Observation [104]  PT Acc Code (Do Not Modify): Observation [10022]       B Medical/Surgery History Past Medical History:  Diagnosis Date  . COPD (chronic obstructive pulmonary disease) ([place])   . Hypertension    No past surgical history on file.   A IV Location/Drains/Wounds Patient  Lines/Drains/Airways Status   Active Line/Drains/Airways    Name:   Placement date:   Placement time:   Site:   Days:   Peripheral IV 09/24/19 Right Antecubital   09/24/19    0015    Antecubital   9   Peripheral IV 10/02/19 Right Antecubital   10/02/19    2145    Antecubital   1   Pressure Injury 09/24/19 Ear Left;Posterior red area noted to left posterior ear    09/24/19    2045     9          Intake/Output Last 24 hours No intake or output data in the 24 hours ending 10/03/19 0031  Labs/Imaging Results for orders placed or performed during the hospital encounter of 10/02/19 (from the past 48 hour(s))  Urinalysis, Routine w reflex microscopic     Status: Abnormal   Collection Time: 10/02/19  8:13 PM  Result Value Ref Range   Color, Urine YELLOW YELLOW   APPearance HAZY (A) CLEAR   Specific Gravity, Urine 1.025 1.005 - 1.030   pH 6.0 5.0 - 8.0   Glucose, UA NEGATIVE NEGATIVE mg/dL   Hgb urine dipstick NEGATIVE NEGATIVE   Bilirubin Urine NEGATIVE NEGATIVE   Ketones, ur NEGATIVE NEGATIVE mg/dL   Protein, ur NEGATIVE NEGATIVE mg/dL   Nitrite NEGATIVE NEGATIVE   Leukocytes,Ua MODERATE (A) NEGATIVE   RBC / HPF 0-5 0 - 5 RBC/hpf   WBC, UA 21-50 0 - 5 WBC/hpf   Bacteria, UA RARE (A) NONE SEEN  Mucus PRESENT     Comment: Performed at [facility], [address] [place], [place] [address]  Lactic acid, plasma     Status: Abnormal   Collection Time: 10/02/19  8:16 PM  Result Value Ref Range   Lactic Acid, Venous 2.5 (HH) 0.5 - 1.9 mmol/L    Comment: CRITICAL RESULT CALLED TO, READ BACK BY AND VERIFIED WITH: POWELL A,RN 10/02/19 2102 WAYK Performed at [facility], [address] [place], [place] [address]   Comprehensive metabolic panel     Status: Abnormal   Collection Time: 10/02/19  8:16 PM  Result Value Ref Range   Sodium 134 (L) 135 - 145 mmol/L   Potassium 4.9 3.5 - 5.1 mmol/L   Chloride 97 (L) 98 - 111 mmol/L   CO2 26 22 - 32 mmol/L   Glucose, Bld 156  (H) 70 - 99 mg/dL   BUN 38 (H) 8 - 23 mg/dL   Creatinine, Ser 1.301.16 0.61 - 1.24 mg/dL   Calcium 8.8 (L) 8.9 - 10.3 mg/dL   Total Protein 6.2 (L) 6.5 - 8.1 g/dL   Albumin 2.6 (L) 3.5 - 5.0 g/dL   AST 22 15 - 41 U/L   ALT 53 (H) 0 - 44 U/L   Alkaline Phosphatase 68 38 - 126 U/L   Total Bilirubin 0.6 0.3 - 1.2 mg/dL   GFR calc non Af Amer 59 (L) >60 mL/min   GFR calc Af Amer >60 >60 mL/min   Anion gap 11 5 - 15    Comment: Performed at [facility], [address] [place], [place] [address]  CBC with Differential     Status: Abnormal   Collection Time: 10/02/19  8:16 PM  Result Value Ref Range   WBC 10.9 (H) 4.0 - 10.5 K/uL   RBC 4.98 4.22 - 5.81 MIL/uL   Hemoglobin 14.2 13.0 - 17.0 g/dL   HCT 46.944.5 62.939.0 - 52.852.0 %   MCV 89.4 80.0 - 100.0 fL   MCH 28.5 26.0 - 34.0 pg   MCHC 31.9 30.0 - 36.0 g/dL   RDW 41.316.0 (H) 24.411.5 - 01.015.5 %   Platelets 335 150 - 400 K/uL   nRBC 0.0 0.0 - 0.2 %   Neutrophils Relative % 83 %   Neutro Abs 9.0 (H) 1.7 - 7.7 K/uL   Lymphocytes Relative 9 %   Lymphs Abs 0.9 0.7 - 4.0 K/uL   Monocytes Relative 6 %   Monocytes Absolute 0.7 0.1 - 1.0 K/uL   Eosinophils Relative 0 %   Eosinophils Absolute 0.0 0.0 - 0.5 K/uL   Basophils Relative 0 %   Basophils Absolute 0.0 0.0 - 0.1 K/uL   Immature Granulocytes 2 %   Abs Immature Granulocytes 0.22 (H) 0.00 - 0.07 K/uL    Comment: Performed at [facility], [address] [place], [place] [address]   No results found.  Pending Labs Unresulted Labs (From admission, onward)    Start     Ordered   10/02/19 2013  Lactic acid, plasma  Now then every 2 hours,   STAT     10/02/19 2012          Vitals/Pain Today's Vitals   10/02/19 2142 10/02/19 2200 10/02/19 2215 10/02/19 2245  BP:  112/84 117/88 112/89  Pulse:  89 89 88  Resp:  17 (!) 21 18  Temp:      TempSrc:      SpO2:  93% 95%  95%  PainSc: 0-No pain       Isolation Precautions No active isolations  Medications Medications  lactated  ringers infusion ( Intravenous New Bag/Given 10/02/19 2141)  sodium chloride 0.9 % bolus 500 mL (500 mLs Intravenous New Bag/Given 10/02/19 2141)    Mobility walks with person assist Low fall risk   Focused Assessments Pulmonary Assessment Handoff:  Lung sounds:   O2 Device: Room Air        R Recommendations: See Admitting Provider Note  Report given to:   Additional Notes:

## 2019-10-03 NOTE — Progress Notes (Signed)
PROGRESS NOTE                                                                                                                                                                                                             Patient Demographics:    Aaron Daniels, is a 78 y.o. male, DOB - 1941-01-18, ZOX:[id number]  Outpatient Primary MD for the patient is Albertha Ghee, MD   Admit date - 10/02/2019   LOS - 0  Chief Complaint  Patient presents with   Covid    09/21/2019       Brief Narrative: Patient is a 78 y.o. male with PMHx of COPD on chronic hypoxic respiratory failure on home O2, chronic diastolic heart failure, HTN, DM-2-who was admitted to [facility] for COVID-19 pneumonia from 10/11-10/20-presented to the hospital for weakness.  Patient lives with his son who is his primary caregiver-unfortunately has COVID-19 and is being admitted to the hospital.   Subjective:    Aaron Daniels today was lying comfortably in bed-no major issues overnight.   Assessment  & Plan :   Covid 19 infection: Appropriately treated last admission-currently has no active Covid pneumonitis ongoing.  Supportive care only at this point.  Fever: afebrile  O2 requirements: On RA  COVID-19 Labs: No results for input(s): DDIMER, FERRITIN, LDH, CRP in the last 72 hours.  Lab Results  Component Value Date   SARSCOV2NAA POSITIVE (A) 09/21/2019   SARSCOV2NAA NEGATIVE 09/03/2019   SARSCOV2NAA NEGATIVE 07/03/2019     COVID-19 Medications: Steroids: 10/12>> 10/22 Remdesivir: 1012>> 10/16 Actemra: Not indicated Convalescent Plasma: Not indicated Research Studies:N/A  Other medications: Diuretics:Euvolemic- on Lasix chronically Antibiotics:Not needed as no evidence of bacterial infection  Prone/Incentive Spirometry: encouraged  incentive spirometry use 3-4/hour.  DVT Prophylaxis  :  Lovenox   UTI: Continue Rocephin-follow  cultures  COPD on chronic hypoxemic respiratory failure: Stable-continue bronchodilators  Chronic diastolic heart failure: Euvolemic-resume oral Lasix  Suspicion for OSA: Stable for outpatient follow-up  ABG:    Component Value Date/Time   PHART 7.401 09/21/2019 1736   PCO2ART 47.4 09/21/2019 1736   PO2ART 50.0 (L) 09/21/2019 1736   HCO3 29.4 (H) 09/21/2019 1736   TCO2 31 09/21/2019 1736   O2SAT 84.0 09/21/2019 1736    Vent Settings:  FiO2 (%):  [98 %] 98 %  Condition - Stable  Family Communication  : None  Code Status : DNR  Diet :  Diet Order            Diet heart healthy/carb modified Room service appropriate? Yes; Fluid consistency: Thin  Diet effective now               Disposition Plan  :  Remain hospitalized  Barriers to discharge: Awaiting urine culture-and IV Rocephin-awaiting SNF bed as well  Consults  :  None  Procedures  :  None  Antibiotics  :    Anti-infectives (From admission, onward)   Start     Dose/Rate Route Frequency Ordered Stop   10/03/19 0200  cefTRIAXone (ROCEPHIN) 1 g in sodium chloride 0.9 % 100 mL IVPB     1 g 200 mL/hr over 30 Minutes Intravenous Daily at bedtime 10/03/19 0141        Inpatient Medications  Scheduled Meds:  enoxaparin (LOVENOX) injection  40 mg Subcutaneous Daily   insulin aspart  0-5 Units Subcutaneous QHS   insulin aspart  0-9 Units Subcutaneous TID WC   Continuous Infusions:  cefTRIAXone (ROCEPHIN)  IV Stopped (10/03/19 0305)   PRN Meds:.acetaminophen **OR** acetaminophen, diphenhydrAMINE, ipratropium-albuterol   Time Spent in minutes  25  See all Orders from today for further details   Jeoffrey Massed M.D on 10/03/2019 at 1:07 PM  To page go to [email or web address] - use universal password  [facility] -  Office  [phone]    Objective:   Vitals:   10/03/19 0445 10/03/19 0500 10/03/19 0600 10/03/19 0935  BP: 117/84 110/77 94/65 (!) 141/98  Pulse: 75 79 82 93  Resp: 15 15 15  (!)  26  Temp:      TempSrc:      SpO2: 95%   95%  Weight:      Height:        Wt Readings from Last 3 Encounters:  10/03/19 96.1 kg  09/28/19 98.7 kg  09/15/19 104.3 kg     Intake/Output Summary (Last 24 hours) at 10/03/2019 1307 Last data filed at 10/03/2019 1000 Gross per 24 hour  Intake 480 ml  Output --  Net 480 ml     Physical Exam Gen Exam:Alert awake-not in any distress HEENT:atraumatic, normocephalic Chest: B/L clear to auscultation anteriorly CVS:S1S2 regular Abdomen:soft non tender, non distended Extremities:no edema Neurology: Non focal Skin: no rash   Data Review:    CBC Recent Labs  Lab 09/27/19 0337 09/29/19 0320 10/02/19 2016  WBC 5.5 7.1 10.9*  HGB 12.2* 12.5* 14.2  HCT 38.8* 39.8 44.5  PLT 313 342 335  MCV 90.9 90.9 89.4  MCH 28.6 28.5 28.5  MCHC 31.4 31.4 31.9  RDW 15.5 15.9* 16.0*  LYMPHSABS 0.6*  --  0.9  MONOABS 0.3  --  0.7  EOSABS 0.0  --  0.0  BASOSABS 0.0  --  0.0    Chemistries  Recent Labs  Lab 09/27/19 0337 09/29/19 0320 09/30/19 0303 10/02/19 2016  NA 139 138 136 134*  K 4.5 4.5 4.7 4.9  CL 100 95* 95* 97*  CO2 29 31 31 26   GLUCOSE 155* 103* 139* 156*  BUN 33* 32* 38* 38*  CREATININE 0.92 1.00 1.03 1.16  CALCIUM 8.4* 8.8* 8.9 8.8*  MG 2.0  --   --   --   AST 55* 24  --  22  ALT 129* 78*  --  53*  ALKPHOS 60 60  --  68  BILITOT 0.4 1.0  --  0.6   ------------------------------------------------------------------------------------------------------------------ No results for input(s): CHOL, HDL, LDLCALC, TRIG, CHOLHDL, LDLDIRECT in the last 72 hours.  Lab Results  Component Value Date   HGBA1C 6.5 (H) 09/04/2019   ------------------------------------------------------------------------------------------------------------------ No results for input(s): TSH, T4TOTAL, T3FREE, THYROIDAB in the last 72 hours.  Invalid input(s):  FREET3 ------------------------------------------------------------------------------------------------------------------ No results for input(s): VITAMINB12, FOLATE, FERRITIN, TIBC, IRON, RETICCTPCT in the last 72 hours.  Coagulation profile No results for input(s): INR, PROTIME in the last 168 hours.  No results for input(s): DDIMER in the last 72 hours.  Cardiac Enzymes No results for input(s): CKMB, TROPONINI, MYOGLOBIN in the last 168 hours.  Invalid input(s): CK ------------------------------------------------------------------------------------------------------------------    Component Value Date/Time   BNP 67.7 09/21/2019 1543    Micro Results No results found for this or any previous visit (from the past 240 hour(s)).  Radiology Reports Dg Chest 2 View  Result Date: 09/06/2019 CLINICAL DATA:  Shortness of breath EXAM: CHEST - 2 VIEW COMPARISON:  09/04/2019 FINDINGS: Lungs are hyperexpanded. Interstitial markings are diffusely coarsened with chronic features. Basilar scarring again noted. Nodular opacity projects over the posterior right sixth rib, not definitely seen on prior study. The cardiopericardial silhouette is within normal limits for size. The visualized bony structures of the thorax are intact. Telemetry leads overlie the chest. IMPRESSION: 1. Emphysema with basilar scarring. 2. Nodular density at the right base. Patient had chest CT yesterday revealing no nodule at this location. Features on today's exam likely related to a confluence of shadows. Electronically Signed   By: Kennith CenterEric  Mansell M.D.   On: 09/06/2019 11:41   Ct Angio Chest Pe W/cm &/or Wo Cm  Result Date: 09/21/2019 CLINICAL DATA:  Shortness of breath, PE suspected, high pretest probability EXAM: CT ANGIOGRAPHY CHEST WITH CONTRAST TECHNIQUE: Multidetector CT imaging of the chest was performed using the standard protocol during bolus administration of intravenous contrast. Multiplanar CT image  reconstructions and MIPs were obtained to evaluate the vascular anatomy. CONTRAST:  75mL OMNIPAQUE IOHEXOL 350 MG/ML SOLN COMPARISON:  Most recent CTA chest 09/05/2019 FINDINGS: Cardiovascular: Suboptimal opacification of the pulmonary arteries. Contrast opacifies to the level of the proximal segmental arteries with more distal evaluation limited by bolus and respiratory motion. No central, lobar or proximal segmental pulmonary arterial filling defects are seen. Central pulmonary arteries are normal caliber. Atherosclerotic plaque within the normal caliber aorta. Shared origin of the brachiocephalic and left common carotid. Atheromatous plaque within the proximal great vessels. Normal heart size. Small volume pericardial fluid is likely within physiologic normal. Aortic leaflet calcifications are present. Coronary artery calcifications are present as well. Mediastinum/Nodes: Multiple reactive appearing though nonenlarged lymph nodes are present in the mediastinum and bilateral hila. Thyroid gland and thoracic inlet are unremarkable. Bowing of the posterior trachea reflects imaging during exhalation. No acute tracheal abnormality. The esophagus is unremarkable. Lungs/Pleura: There are patchy multifocal areas of mixed reticular interstitial and ground-glass opacity most pronounced in the periphery of the posterior segments of both upper lobes, within the lingula and right lung base. Small bilateral pleural effusions are present as well. No pneumothorax. Upper Abdomen: Nodular thickening of the adrenal glands is similar to comparison studies No acute abnormalities present in the visualized portions of the upper abdomen. Musculoskeletal: Multilevel degenerative changes are present in the imaged portions of the spine. More severe changes are noted in the lower cervical spine, incompletely evaluated on this CT exam. Mild left and severe right osteoarthrosis of the shoulders. Review of the MIP images confirms the above  findings. IMPRESSION: 1. Suboptimal opacification of the pulmonary arteries but without visible central, lobar or proximal segmental pulmonary arterial filling defects. 2. Patchy multifocal areas of mixed reticular interstitial and ground-glass opacity most pronounced in the periphery of the posterior segments of both upper lobes, within the lingula and right lung base, concerning for a possible acute infectious or inflammatory process. Reactive adenopathy present in the mediastinum and hila. 3. Small bilateral pleural effusions. 4. Nodular thickening of the adrenal glands likely reflecting senescent adrenal hyperplasia. 5. Aortic Atherosclerosis (ICD10-I70.0). Electronically Signed   By: Lovena Le M.D.   On: 09/21/2019 22:32   Ct Angio Chest Pe W Or Wo Contrast  Result Date: 09/05/2019 CLINICAL DATA:  Shortness of breath EXAM: CT ANGIOGRAPHY CHEST WITH CONTRAST TECHNIQUE: Multidetector CT imaging of the chest was performed using the standard protocol during bolus administration of intravenous contrast. Multiplanar CT image reconstructions and MIPs were obtained to evaluate the vascular anatomy. CONTRAST:  41mL OMNIPAQUE IOHEXOL 350 MG/ML SOLN COMPARISON:  CT dated July 04, 2019. FINDINGS: Cardiovascular: Evaluation is severely limited by suboptimal contrast bolus timing and significant respiratory motion artifact.Given these limitations, no pulmonary embolism was detected. The main pulmonary artery is within normal limits for size. There is no CT evidence of acute right heart strain. There are atherosclerotic changes of the thoracic aorta without evidence for dissection. Heart size is normal, without pericardial effusion. Mediastinum/Nodes: --No mediastinal or hilar lymphadenopathy. --No axillary lymphadenopathy. --No supraclavicular lymphadenopathy. --Normal thyroid gland. --The esophagus is unremarkable Lungs/Pleura: There is atelectasis at the lung bases. There is no pleural effusion. No pneumothorax. The  trachea is unremarkable. Upper Abdomen: No acute abnormality. Musculoskeletal: No chest wall abnormality. No acute or significant osseous findings. There is a subacute fracture involving the ninth rib posterolaterally on the left. Review of the MIP images confirms the above findings. IMPRESSION: 1. Evaluation is severely limited by suboptimal contrast bolus timing and significant respiratory motion artifact. Given these limitations, no pulmonary embolism was detected. 2. Bibasilar atelectasis. Aortic Atherosclerosis (ICD10-I70.0). Electronically Signed   By: Constance Holster M.D.   On: 09/05/2019 09:48   Nm Pulmonary Perf And Vent  Result Date: 09/06/2019 CLINICAL DATA:  Hypoxemia and respiratory failure. EXAM: NUCLEAR MEDICINE VENTILATION - PERFUSION LUNG SCAN TECHNIQUE: Ventilation images were obtained in multiple projections using inhaled aerosol Tc-65m DTPA. Perfusion images were obtained in multiple projections after intravenous injection of Tc-29m MAA. RADIOPHARMACEUTICALS:  39.3 mCi of Tc-84m DTPA aerosol inhalation and 1.6 mCi Tc48m MAA IV COMPARISON:  09/04/2019 FINDINGS: Ventilation: There is a large segmental ventilation defect involving the superior segment of right lower lobe. Perfusion: Corresponding to the ventilation abnormality is a large a ventilation defect within the superior segment of right lower lobe. No unmatched wedge shaped peripheral perfusion defects to suggest acute pulmonary embolism. No corresponding chest radiograph abnormality. IMPRESSION: Low probability for acute pulmonary embolus. Electronically Signed   By: Kerby Moors M.D.   On: 09/06/2019 10:50   Dg Chest Portable 1 View  Result Date: 09/21/2019 CLINICAL DATA:  Shortness of breath. EXAM: PORTABLE CHEST 1 VIEW COMPARISON:  September 06, 2019. FINDINGS: The heart size and mediastinal contours are within normal limits. No pneumothorax or pleural effusion is noted. Increased bibasilar atelectasis or edema is noted. No  pneumothorax or pleural effusion is noted. The visualized skeletal structures are unremarkable. IMPRESSION: Increased bibasilar atelectasis or edema is noted. Electronically Signed   By: Marijo Conception M.D.   On: 09/21/2019 16:48   Dg Chest Port 1  View  Result Date: 09/04/2019 CLINICAL DATA:  78 year old male with a history of shortness of breath EXAM: PORTABLE CHEST 1 VIEW COMPARISON:  September 03, 2011 FINDINGS: Cardiomediastinal silhouette unchanged in size and contour. Low lung volumes. Interlobular septal thickening, relatively similar to the prior with linear opacities at the lung bases. No pneumothorax. No confluent airspace disease. No displaced fracture. IMPRESSION: Low lung volumes with interlobular septal thickening, either chronic changes related to prior episodes of CHF, or potentially early pulmonary edema. Basilar atelectasis and/or small pleural effusions. Electronically Signed   By: Gilmer Mor D.O.   On: 09/04/2019 20:55   Dg Chest Port 1 View  Result Date: 09/03/2019 CLINICAL DATA:  Shortness of breath and cough.  Body swelling. EXAM: PORTABLE CHEST 1 VIEW COMPARISON:  08/14/2019 FINDINGS: Heart size upper limits of normal. Chronic aortic atherosclerosis. Possible venous hypertension without frank edema. Interstitial markings at the lung bases most consistent with interstitial edema. Similar appearance to the most previous exam. IMPRESSION: Pulmonary venous hypertension and mild interstitial edema, similar to the study of 08/14/2019. Electronically Signed   By: Paulina Fusi M.D.   On: 09/03/2019 17:56

## 2019-10-03 NOTE — Progress Notes (Signed)
The pt is in contact with his family via telephone and declines when asked if I can call anyone to give an update. He agrees to let me know if anyone requests an update so that I may assist. Pt's son is hospitalized and plans to visit pt at some point today.

## 2019-10-04 DIAGNOSIS — U071 COVID-19: Secondary | ICD-10-CM | POA: Diagnosis not present

## 2019-10-04 LAB — BASIC METABOLIC PANEL
Anion gap: 9 (ref 5–15)
BUN: 38 mg/dL — ABNORMAL HIGH (ref 8–23)
CO2: 29 mmol/L (ref 22–32)
Calcium: 8.7 mg/dL — ABNORMAL LOW (ref 8.9–10.3)
Chloride: 97 mmol/L — ABNORMAL LOW (ref 98–111)
Creatinine, Ser: 1 mg/dL (ref 0.61–1.24)
GFR calc Af Amer: >60 mL/min (ref >60)
GFR calc non Af Amer: >60 mL/min (ref >60)
Glucose, Bld: 143 mg/dL — ABNORMAL HIGH (ref 70–99)
Potassium: 4.7 mmol/L (ref 3.5–5.1)
Sodium: 135 mmol/L (ref 135–145)

## 2019-10-04 LAB — URINE CULTURE

## 2019-10-04 LAB — GLUCOSE, CAPILLARY
Glucose-Capillary: 105 mg/dL — ABNORMAL HIGH (ref 70–99)
Glucose-Capillary: 206 mg/dL — ABNORMAL HIGH (ref 70–99)
Glucose-Capillary: 206 mg/dL — ABNORMAL HIGH (ref 70–99)
Glucose-Capillary: 224 mg/dL — ABNORMAL HIGH (ref 70–99)

## 2019-10-04 NOTE — Progress Notes (Signed)
PROGRESS NOTE                                                                                                                                                                                                             Patient Demographics:    Aaron Daniels, is a 78 y.o. male, DOB - [birth year], GGY:[id number]  Outpatient Primary MD for the patient is Albertha Ghee, MD   Admit date - 10/02/2019   LOS - 1  Chief Complaint  Patient presents with   Covid    09/21/2019       Brief Narrative: Patient is a 78 y.o. male with PMHx of COPD on chronic hypoxic respiratory failure on home O2, chronic diastolic heart failure, HTN, DM-2-who was admitted to [facility] for COVID-19 pneumonia from 10/11-10/20-presented to the hospital for weakness.  Patient lives with his son who is his primary caregiver-unfortunately has COVID-19 and is being admitted to the hospital.   Subjective:   Patient in recliner eating breakfast, appears comfortable, denies any headache, no fever, no chest pain or pressure, no shortness of breath , no abdominal pain. No focal weakness.   Assessment  & Plan :   Covid 19 infection: Appropriately treated and no acute issues from this problem.   Lab Results  Component Value Date   SARSCOV2NAA POSITIVE (A) 09/21/2019   SARSCOV2NAA NEGATIVE 09/03/2019   SARSCOV2NAA NEGATIVE 07/03/2019     COVID-19 Medications: Steroids: 10/12>> 10/22 Remdesivir: 1012>> 10/16    Weakness and deconditioning.  Unable to take care of himself at home, family members living with him are all acutely sick with COVID-19 infection.  PT OT eval.  May require placement to SNF for safe disposition.    UTI: Urine culture is inconclusive will treat empirically with 5 days of antibiotics total.  COPD on chronic hypoxemic respiratory failure: Stable-continue bronchodilators  Chronic diastolic heart failure: Euvolemic-resume oral  Lasix  Suspicion for OSA: Stable for outpatient follow-up.  DM type II.  On ISS.  Outpatient control.   CBG (last 3)  Recent Labs    10/03/19 1633 10/03/19 2118 10/04/19 0812  GLUCAP 148* 196* 105*   Lab Results  Component Value Date   HGBA1C 6.5 (H) 09/04/2019     Condition - Stable  Family Communication  : None  Code Status : DNR  Diet :  Diet Order            Diet heart healthy/carb modified Room service appropriate? Yes; Fluid consistency: Thin  Diet effective now               Disposition Plan  :  Remain hospitalized, PT OT, may require SNF.  Barriers to discharge: Awaiting safe disposition plan by social work.  Consults  :  None  Procedures  :  None  Antibiotics  :    Anti-infectives (From admission, onward)   Start     Dose/Rate Route Frequency Ordered Stop   10/03/19 0200  cefTRIAXone (ROCEPHIN) 1 g in sodium chloride 0.9 % 100 mL IVPB     1 g 200 mL/hr over 30 Minutes Intravenous Daily at bedtime 10/03/19 0141       DVT Prophylaxis  :  Lovenox   Inpatient Medications  Scheduled Meds:  enoxaparin (LOVENOX) injection  40 mg Subcutaneous Daily   famotidine  20 mg Oral Daily   furosemide  40 mg Oral Daily   insulin aspart  0-5 Units Subcutaneous QHS   insulin aspart  0-9 Units Subcutaneous TID WC   mouth rinse  15 mL Mouth Rinse BID   tamsulosin  0.4 mg Oral QPC supper   umeclidinium-vilanterol  1 puff Inhalation Daily   Continuous Infusions:  cefTRIAXone (ROCEPHIN)  IV Stopped (10/03/19 2145)   PRN Meds:.acetaminophen **OR** acetaminophen, albuterol, diphenhydrAMINE, ipratropium-albuterol   Time Spent in minutes  25  See all Orders from today for further details   Susa Raring M.D on 10/04/2019 at 11:31 AM  To page go to [email or web address] - use universal password  [facility] -  Office  [phone]    Objective:   Vitals:   10/03/19 1700 10/03/19 1927 10/04/19 0000 10/04/19 0811  BP:  (!) 149/84 104/79  104/84  Pulse: 82   83  Resp: 17   15  Temp:  98.6 F (37 C) 98.3 F (36.8 C) 97.9 F (36.6 C)  TempSrc:  Oral Oral Oral  SpO2: 93%   93%  Weight:      Height:        Wt Readings from Last 3 Encounters:  10/03/19 96.1 kg  09/28/19 98.7 kg  09/15/19 104.3 kg     Intake/Output Summary (Last 24 hours) at 10/04/2019 1131 Last data filed at 10/04/2019 0700 Gross per 24 hour  Intake 960 ml  Output 2800 ml  Net -1840 ml     Physical Exam  Awake Alert, No new F.N deficits, Normal affect [place].AT,PERRAL Supple Neck,No JVD, No cervical lymphadenopathy appriciated.  Symmetrical Chest wall movement, Good air movement bilaterally, CTAB RRR,No Gallops, Rubs or new Murmurs, No Parasternal Heave +ve B.Sounds, Abd Soft, No tenderness, No organomegaly appriciated, No rebound - guarding or rigidity. No Cyanosis, Clubbing or edema, No new Rash or bruise    Data Review:    CBC Recent Labs  Lab 09/29/19 0320 10/02/19 2016  WBC 7.1 10.9*  HGB 12.5* 14.2  HCT 39.8 44.5  PLT 342 335  MCV 90.9 89.4  MCH 28.5 28.5  MCHC 31.4 31.9  RDW 15.9* 16.0*  LYMPHSABS  --  0.9  MONOABS  --  0.7  EOSABS  --  0.0  BASOSABS  --  0.0    Chemistries  Recent Labs  Lab 09/29/19 0320 09/30/19 0303 10/02/19 2016 10/04/19 0325  NA 138 136 134* 135  K 4.5 4.7 4.9 4.7  CL 95* 95* 97* 97*  CO2 31 31  26 29  GLUCOSE 103* 139* 156* 143*  BUN 32* 38* 38* 38*  CREATININE 1.00 1.03 1.16 1.00  CALCIUM 8.8* 8.9 8.8* 8.7*  AST 24  --  22  --   ALT 78*  --  53*  --   ALKPHOS 60  --  68  --   BILITOT 1.0  --  0.6  --    ------------------------------------------------------------------------------------------------------------------ No results for input(s): CHOL, HDL, LDLCALC, TRIG, CHOLHDL, LDLDIRECT in the last 72 hours.  Lab Results  Component Value Date   HGBA1C 6.5 (H) 09/04/2019    ------------------------------------------------------------------------------------------------------------------ No results for input(s): TSH, T4TOTAL, T3FREE, THYROIDAB in the last 72 hours.  Invalid input(s): FREET3 ------------------------------------------------------------------------------------------------------------------ No results for input(s): VITAMINB12, FOLATE, FERRITIN, TIBC, IRON, RETICCTPCT in the last 72 hours.  Coagulation profile No results for input(s): INR, PROTIME in the last 168 hours.  No results for input(s): DDIMER in the last 72 hours.  Cardiac Enzymes No results for input(s): CKMB, TROPONINI, MYOGLOBIN in the last 168 hours.  Invalid input(s): CK ------------------------------------------------------------------------------------------------------------------    Component Value Date/Time   BNP 41.5 10/03/2019 1413    Micro Results Recent Results (from the past 240 hour(s))  Urine Culture     Status: Abnormal   Collection Time: 10/02/19  8:20 PM   Specimen: Urine, Random  Result Value Ref Range Status   Specimen Description URINE, RANDOM  Final   Special Requests   Final    NONE Performed at [facility], [address] [place], [place] [address]    Culture MULTIPLE SPECIES PRESENT, SUGGEST RECOLLECTION (A)  Final   Report Status 10/04/2019 FINAL  Final    Radiology Reports Dg Chest 2 View  Result Date: 09/06/2019 CLINICAL DATA:  Shortness of breath EXAM: CHEST - 2 VIEW COMPARISON:  09/04/2019 FINDINGS: Lungs are hyperexpanded. Interstitial markings are diffusely coarsened with chronic features. Basilar scarring again noted. Nodular opacity projects over the posterior right sixth rib, not definitely seen on prior study. The cardiopericardial silhouette is within normal limits for size. The visualized bony structures of the thorax are intact. Telemetry leads overlie the chest. IMPRESSION: 1. Emphysema with basilar scarring. 2. Nodular  density at the right base. Patient had chest CT yesterday revealing no nodule at this location. Features on today's exam likely related to a confluence of shadows. Electronically Signed   By: Kennith Center M.D.   On: 09/06/2019 11:41   Ct Angio Chest Pe W/cm &/or Wo Cm  Result Date: 09/21/2019 CLINICAL DATA:  Shortness of breath, PE suspected, high pretest probability EXAM: CT ANGIOGRAPHY CHEST WITH CONTRAST TECHNIQUE: Multidetector CT imaging of the chest was performed using the standard protocol during bolus administration of intravenous contrast. Multiplanar CT image reconstructions and MIPs were obtained to evaluate the vascular anatomy. CONTRAST:  75mL OMNIPAQUE IOHEXOL 350 MG/ML SOLN COMPARISON:  Most recent CTA chest 09/05/2019 FINDINGS: Cardiovascular: Suboptimal opacification of the pulmonary arteries. Contrast opacifies to the level of the proximal segmental arteries with more distal evaluation limited by bolus and respiratory motion. No central, lobar or proximal segmental pulmonary arterial filling defects are seen. Central pulmonary arteries are normal caliber. Atherosclerotic plaque within the normal caliber aorta. Shared origin of the brachiocephalic and left common carotid. Atheromatous plaque within the proximal great vessels. Normal heart size. Small volume pericardial fluid is likely within physiologic normal. Aortic leaflet calcifications are present. Coronary artery calcifications are present as well. Mediastinum/Nodes: Multiple reactive appearing though nonenlarged lymph nodes are present in the mediastinum and bilateral  hila. Thyroid gland and thoracic inlet are unremarkable. Bowing of the posterior trachea reflects imaging during exhalation. No acute tracheal abnormality. The esophagus is unremarkable. Lungs/Pleura: There are patchy multifocal areas of mixed reticular interstitial and ground-glass opacity most pronounced in the periphery of the posterior segments of both upper lobes,  within the lingula and right lung base. Small bilateral pleural effusions are present as well. No pneumothorax. Upper Abdomen: Nodular thickening of the adrenal glands is similar to comparison studies No acute abnormalities present in the visualized portions of the upper abdomen. Musculoskeletal: Multilevel degenerative changes are present in the imaged portions of the spine. More severe changes are noted in the lower cervical spine, incompletely evaluated on this CT exam. Mild left and severe right osteoarthrosis of the shoulders. Review of the MIP images confirms the above findings. IMPRESSION: 1. Suboptimal opacification of the pulmonary arteries but without visible central, lobar or proximal segmental pulmonary arterial filling defects. 2. Patchy multifocal areas of mixed reticular interstitial and ground-glass opacity most pronounced in the periphery of the posterior segments of both upper lobes, within the lingula and right lung base, concerning for a possible acute infectious or inflammatory process. Reactive adenopathy present in the mediastinum and hila. 3. Small bilateral pleural effusions. 4. Nodular thickening of the adrenal glands likely reflecting senescent adrenal hyperplasia. 5. Aortic Atherosclerosis (ICD10-I70.0). Electronically Signed   By: Kreg ShropshirePrice  DeHay M.D.   On: 09/21/2019 22:32   Ct Angio Chest Pe W Or Wo Contrast  Result Date: 09/05/2019 CLINICAL DATA:  Shortness of breath EXAM: CT ANGIOGRAPHY CHEST WITH CONTRAST TECHNIQUE: Multidetector CT imaging of the chest was performed using the standard protocol during bolus administration of intravenous contrast. Multiplanar CT image reconstructions and MIPs were obtained to evaluate the vascular anatomy. CONTRAST:  66mL OMNIPAQUE IOHEXOL 350 MG/ML SOLN COMPARISON:  CT dated July 04, 2019. FINDINGS: Cardiovascular: Evaluation is severely limited by suboptimal contrast bolus timing and significant respiratory motion artifact.Given these limitations,  no pulmonary embolism was detected. The main pulmonary artery is within normal limits for size. There is no CT evidence of acute right heart strain. There are atherosclerotic changes of the thoracic aorta without evidence for dissection. Heart size is normal, without pericardial effusion. Mediastinum/Nodes: --No mediastinal or hilar lymphadenopathy. --No axillary lymphadenopathy. --No supraclavicular lymphadenopathy. --Normal thyroid gland. --The esophagus is unremarkable Lungs/Pleura: There is atelectasis at the lung bases. There is no pleural effusion. No pneumothorax. The trachea is unremarkable. Upper Abdomen: No acute abnormality. Musculoskeletal: No chest wall abnormality. No acute or significant osseous findings. There is a subacute fracture involving the ninth rib posterolaterally on the left. Review of the MIP images confirms the above findings. IMPRESSION: 1. Evaluation is severely limited by suboptimal contrast bolus timing and significant respiratory motion artifact. Given these limitations, no pulmonary embolism was detected. 2. Bibasilar atelectasis. Aortic Atherosclerosis (ICD10-I70.0). Electronically Signed   By: Katherine Mantlehristopher  Green M.D.   On: 09/05/2019 09:48   Nm Pulmonary Perf And Vent  Result Date: 09/06/2019 CLINICAL DATA:  Hypoxemia and respiratory failure. EXAM: NUCLEAR MEDICINE VENTILATION - PERFUSION LUNG SCAN TECHNIQUE: Ventilation images were obtained in multiple projections using inhaled aerosol Tc-6451m DTPA. Perfusion images were obtained in multiple projections after intravenous injection of Tc-2951m MAA. RADIOPHARMACEUTICALS:  39.3 mCi of Tc-6551m DTPA aerosol inhalation and 1.6 mCi Tc7351m MAA IV COMPARISON:  09/04/2019 FINDINGS: Ventilation: There is a large segmental ventilation defect involving the superior segment of right lower lobe. Perfusion: Corresponding to the ventilation abnormality is a large a ventilation defect within  the superior segment of right lower lobe. No unmatched  wedge shaped peripheral perfusion defects to suggest acute pulmonary embolism. No corresponding chest radiograph abnormality. IMPRESSION: Low probability for acute pulmonary embolus. Electronically Signed   By: Kerby Moors M.D.   On: 09/06/2019 10:50   Dg Chest Portable 1 View  Result Date: 09/21/2019 CLINICAL DATA:  Shortness of breath. EXAM: PORTABLE CHEST 1 VIEW COMPARISON:  September 06, 2019. FINDINGS: The heart size and mediastinal contours are within normal limits. No pneumothorax or pleural effusion is noted. Increased bibasilar atelectasis or edema is noted. No pneumothorax or pleural effusion is noted. The visualized skeletal structures are unremarkable. IMPRESSION: Increased bibasilar atelectasis or edema is noted. Electronically Signed   By: Marijo Conception M.D.   On: 09/21/2019 16:48   Dg Chest Port 1 View  Result Date: 09/04/2019 CLINICAL DATA:  78 year old male with a history of shortness of breath EXAM: PORTABLE CHEST 1 VIEW COMPARISON:  September 03, 2011 FINDINGS: Cardiomediastinal silhouette unchanged in size and contour. Low lung volumes. Interlobular septal thickening, relatively similar to the prior with linear opacities at the lung bases. No pneumothorax. No confluent airspace disease. No displaced fracture. IMPRESSION: Low lung volumes with interlobular septal thickening, either chronic changes related to prior episodes of CHF, or potentially early pulmonary edema. Basilar atelectasis and/or small pleural effusions. Electronically Signed   By: Corrie Mckusick D.O.   On: 09/04/2019 20:55

## 2019-10-05 DIAGNOSIS — U071 COVID-19: Secondary | ICD-10-CM | POA: Diagnosis not present

## 2019-10-05 LAB — GLUCOSE, CAPILLARY
Glucose-Capillary: 140 mg/dL — ABNORMAL HIGH (ref 70–99)
Glucose-Capillary: 160 mg/dL — ABNORMAL HIGH (ref 70–99)
Glucose-Capillary: 222 mg/dL — ABNORMAL HIGH (ref 70–99)

## 2019-10-05 MED ORDER — LACTATED RINGERS IV SOLN
INTRAVENOUS | Status: AC
  Administered 2019-10-05: 12:00:00 via INTRAVENOUS

## 2019-10-05 NOTE — Evaluation (Signed)
Occupational Therapy Evaluation Patient Details Name: Aaron Daniels MRN: [id number] DOB: 02/13/1941 Today's Date: 10/05/2019    History of Present Illness 78 y/o male recently d/c home on 09/28 then 10/20  after hospitalization w/ hypoxic respiratory failure. returned to ED w/ worsening SOB, admitted with acute CHF exacerbation then found to be COVID +PMHx: COPD on 3L/min at home, HTN, BPH s/p TURP, CHF   Clinical Impression   PTA, pt was living with his wife and was independent performing ADLs, IADLs, driving (during day), and acting as caregiver for his wife. Pt currently requiring Min Guard A for LB ADLs and functional mobility for safety. Pt SpO2 >95% on RA throughout session. Pt presenting with tangential conversation and decreased attention; unsure if baseline cognition. Pt would benefit from further acute OT to facilitate safe dc. Recommend dc to home with HHOT for further OT to optimize safety, independence with ADLs, and return to PLOF.      Follow Up Recommendations  Home health OT;Supervision - Intermittent    Equipment Recommendations  None recommended by OT    Recommendations for Other Services       Precautions / Restrictions Precautions Precautions: Fall Restrictions Weight Bearing Restrictions: No      Mobility Bed Mobility               General bed mobility comments: Pt in recliner upon arrival  Transfers Overall transfer level: Needs assistance Equipment used: None Transfers: Sit to/from Stand;Stand Pivot Transfers Sit to Stand: Min guard         General transfer comment: Min Guard A for safety    Balance Overall balance assessment: Needs assistance Sitting-balance support: Feet supported Sitting balance-Leahy Scale: Good     Standing balance support: During functional activity;No upper extremity supported Standing balance-Leahy Scale: Good                             ADL either performed or assessed with clinical judgement    ADL Overall ADL's : Needs assistance/impaired Eating/Feeding: Set up;Sitting   Grooming: Set up;Oral care;Supervision/safety;Standing Grooming Details (indicate cue type and reason): Supervision for safety. requiring significant time for both FM and vision deficits. Upper Body Bathing: Set up;Sitting   Lower Body Bathing: Min guard;Sit to/from stand   Upper Body Dressing : Set up;Supervision/safety;Sitting   Lower Body Dressing: Min guard;Sit to/from stand   Toilet Transfer: Ambulation;Min guard(Simulated to recliner)           Functional mobility during ADLs: Min guard;Rolling walker General ADL Comments: Requiring Supervision-Min Guard A for safety.      Vision Baseline Vision/History: Macular Degeneration(MD at right eye) Patient Visual Report: No change from baseline       Perception     Praxis      Pertinent Vitals/Pain Pain Assessment: No/denies pain     Hand Dominance Right   Extremity/Trunk Assessment Upper Extremity Assessment Upper Extremity Assessment: Overall WFL for tasks assessed   Lower Extremity Assessment Lower Extremity Assessment: Defer to PT evaluation   Cervical / Trunk Assessment Cervical / Trunk Assessment: Normal   Communication Communication Communication: No difficulties   Cognition Arousal/Alertness: Awake/alert Behavior During Therapy: WFL for tasks assessed/performed Overall Cognitive Status: Within Functional Limits for tasks assessed Area of Impairment: Attention                   Current Attention Level: Selective           General Comments:  Pt presenting with slight tangiental thoughts and enjoys telling stories. Feel pt may be close to cognitve baseline. However, unsure of executive functioning.   General Comments  SpO2 >95% on RA, RR 22-17, and HR 120s during activity    Exercises     Shoulder Instructions      Home Living Family/patient expects to be discharged to:: Private residence Living  Arrangements: Spouse/significant other Available Help at Discharge: Family;Other (Comment) Type of Home: House Home Access: Stairs to enter [employer] of Steps: 2   Home Layout: One level     Bathroom Shower/Tub: Chief Strategy Officer: Standard Bathroom Accessibility: No   Home Equipment: Bedside commode;Shower seat;Walker - 2 wheels   Additional Comments: +      Prior Functioning/Environment Level of Independence: Independent        Comments: Pt performs ADLs and IADLs including caregiver for his wife, driving (in the day), and money management.        OT Problem List: Decreased activity tolerance;Decreased strength;Impaired balance (sitting and/or standing);Decreased safety awareness;Decreased knowledge of use of DME or AE;Cardiopulmonary status limiting activity      OT Treatment/Interventions: Self-care/ADL training;Therapeutic exercise;Neuromuscular education;Energy conservation;DME and/or AE instruction;Therapeutic activities;Patient/family education;Balance training    OT Goals(Current goals can be found in the care plan section) Acute Rehab OT Goals Patient Stated Goal: go home  and be able to move as previous OT Goal Formulation: With patient Time For Goal Achievement: 10/10/19 Potential to Achieve Goals: Good  OT Frequency: Min 3X/week   Barriers to D/C:            Co-evaluation              AM-PAC OT "6 Clicks" Daily Activity     Outcome Measure Help from another person eating meals?: None Help from another person taking care of personal grooming?: A Little Help from another person toileting, which includes using toliet, bedpan, or urinal?: A Little Help from another person bathing (including washing, rinsing, drying)?: A Little Help from another person to put on and taking off regular upper body clothing?: A Little Help from another person to put on and taking off regular lower body clothing?: A Little 6 Click Score:  19   End of Session Nurse Communication: Mobility status  Activity Tolerance: Patient tolerated treatment well Patient left: in chair;with call bell/phone within reach  OT Visit Diagnosis: Unsteadiness on feet (R26.81);Muscle weakness (generalized) (M62.81)                Time: 2831-5176 OT Time Calculation (min): 27 min Charges:  OT General Charges $OT Visit: 1 Visit OT Evaluation $OT Eval Moderate Complexity: 1 Mod OT Treatments $Self Care/Home Management : 8-22 mins  Aaron Daniels MSOT, OTR/L Acute Rehab Pager: [phone] Office: [phone]  Aaron Daniels 10/05/2019, 4:45 PM

## 2019-10-05 NOTE — Progress Notes (Signed)
PROGRESS NOTE                                                                                                                                                                                                             Patient Demographics:    Aaron Daniels, is a 78 y.o. male, DOB - 01/06/1941, ZOX:[id number]  Outpatient Primary MD for the patient is Albertha GheeSteen, James, MD   Admit date - 10/02/2019   LOS - 2  Chief Complaint  Patient presents with   Covid    09/21/2019       Brief Narrative: Patient is a 78 y.o. male with PMHx of COPD on chronic hypoxic respiratory failure on home O2, chronic diastolic heart failure, HTN, DM-2-who was admitted to [facility] for COVID-19 pneumonia from 10/11-10/20-presented to the hospital for weakness.  Patient lives with his son who is his primary caregiver-unfortunately has COVID-19 and is being admitted to the hospital.   Subjective:   Patient in bed in no distress, was sleeping woke up easily, denies any headache chest or abdominal pain.  No shortness of breath.   Assessment  & Plan :   Covid 19 infection: Appropriately treated and no acute issues from this problem.   Lab Results  Component Value Date   SARSCOV2NAA POSITIVE (A) 09/21/2019   SARSCOV2NAA NEGATIVE 09/03/2019   SARSCOV2NAA NEGATIVE 07/03/2019     COVID-19 Medications: Steroids: 10/12>> 10/22 Remdesivir: 1012>> 10/16    Weakness and deconditioning.  Unable to take care of himself at home, family members living with him are all acutely sick with COVID-19 infection.  PT OT eval.  May require placement to SNF for safe disposition.    UTI: Urine culture is inconclusive will treat empirically with 5 days of antibiotics total.  COPD on chronic hypoxemic respiratory failure: Stable-continue bronchodilators  Chronic diastolic heart failure: Slightly dehydrated on 10/05/2019, hold Lasix, gently hydrate with IV  fluids on 10/05/2019 and monitor.  Blood pressure slightly soft will monitor closely.  Suspicion for OSA: Stable for outpatient follow-up.  DM type II.  On ISS.  Outpatient control.   CBG (last 3)  Recent Labs    10/04/19 1623 10/04/19 2031 10/05/19 0738  GLUCAP 206* 224* 140*   Lab Results  Component Value Date   HGBA1C 6.5 (H) 09/04/2019     Condition -  Stable  Family Communication  : None  Code Status : DNR  Diet :  Diet Order            Diet heart healthy/carb modified Room service appropriate? Yes; Fluid consistency: Thin  Diet effective now               Disposition Plan  :  Remain hospitalized, PT OT, may require SNF.  Barriers to discharge: Awaiting safe disposition plan by social work.  Consults  :  None  Procedures  :  None  Antibiotics  :    Anti-infectives (From admission, onward)   Start     Dose/Rate Route Frequency Ordered Stop   10/03/19 0200  cefTRIAXone (ROCEPHIN) 1 g in sodium chloride 0.9 % 100 mL IVPB     1 g 200 mL/hr over 30 Minutes Intravenous Daily at bedtime 10/03/19 0141       DVT Prophylaxis  :  Lovenox   Inpatient Medications  Scheduled Meds:  enoxaparin (LOVENOX) injection  40 mg Subcutaneous Daily   famotidine  20 mg Oral Daily   furosemide  40 mg Oral Daily   insulin aspart  0-5 Units Subcutaneous QHS   insulin aspart  0-9 Units Subcutaneous TID WC   mouth rinse  15 mL Mouth Rinse BID   tamsulosin  0.4 mg Oral QPC supper   umeclidinium-vilanterol  1 puff Inhalation Daily   Continuous Infusions:  cefTRIAXone (ROCEPHIN)  IV Stopped (10/04/19 2200)   lactated ringers     PRN Meds:.acetaminophen **OR** [DISCONTINUED] acetaminophen, albuterol, ipratropium-albuterol   Time Spent in minutes  25  See all Orders from today for further details   Susa Raring M.D on 10/05/2019 at 10:48 AM  To page go to [email or web address] - use universal password  [facility] -  Office  [phone]    Objective:     Vitals:   10/04/19 2100 10/04/19 2300 10/05/19 0500 10/05/19 0739  BP: 93/68 99/60 102/67 95/72  Pulse: 98 86  94  Resp:    16  Temp:  98.2 F (36.8 C) 98.2 F (36.8 C) 98.1 F (36.7 C)  TempSrc:  Oral Oral Oral  SpO2:  92%  91%  Weight:      Height:        Wt Readings from Last 3 Encounters:  10/03/19 96.1 kg  09/28/19 98.7 kg  09/15/19 104.3 kg     Intake/Output Summary (Last 24 hours) at 10/05/2019 1048 Last data filed at 10/05/2019 0440 Gross per 24 hour  Intake --  Output 500 ml  Net -500 ml     Physical Exam  Sleeping in bed but easily arousable, answers all questions appropriately, No new F.N deficits,   [place]rest.AT,PERRAL Supple Neck,No JVD, No cervical lymphadenopathy appriciated.  Symmetrical Chest wall movement, Good air movement bilaterally, CTAB RRR,No Gallops, Rubs or new Murmurs, No Parasternal Heave +ve B.Sounds, Abd Soft, No tenderness, No organomegaly appriciated, No rebound - guarding or rigidity. No Cyanosis, Clubbing or edema, No new Rash or bruise    Data Review:    CBC Recent Labs  Lab 09/29/19 0320 10/02/19 2016  WBC 7.1 10.9*  HGB 12.5* 14.2  HCT 39.8 44.5  PLT 342 335  MCV 90.9 89.4  MCH 28.5 28.5  MCHC 31.4 31.9  RDW 15.9* 16.0*  LYMPHSABS  --  0.9  MONOABS  --  0.7  EOSABS  --  0.0  BASOSABS  --  0.0    Chemistries  Recent Labs  Lab 09/29/19  0320 09/30/19 0303 10/02/19 2016 10/04/19 0325  NA 138 136 134* 135  K 4.5 4.7 4.9 4.7  CL 95* 95* 97* 97*  CO2 GLUCOSE 103* 139* 156* 143*  BUN 32* 38* 38* 38*  CREATININE 1.00 1.03 1.16 1.00  CALCIUM 8.8* 8.9 8.8* 8.7*  AST 24  --  22  --   ALT 78*  --  53*  --   ALKPHOS 60  --  68  --   BILITOT 1.0  --  0.6  --    ------------------------------------------------------------------------------------------------------------------ No results for input(s): CHOL, HDL, LDLCALC, TRIG, CHOLHDL, LDLDIRECT in the last 72 hours.  Lab Results  Component Value Date    HGBA1C 6.5 (H) 09/04/2019   ------------------------------------------------------------------------------------------------------------------ No results for input(s): TSH, T4TOTAL, T3FREE, THYROIDAB in the last 72 hours.  Invalid input(s): FREET3 ------------------------------------------------------------------------------------------------------------------ No results for input(s): VITAMINB12, FOLATE, FERRITIN, TIBC, IRON, RETICCTPCT in the last 72 hours.  Coagulation profile No results for input(s): INR, PROTIME in the last 168 hours.  No results for input(s): DDIMER in the last 72 hours.  Cardiac Enzymes No results for input(s): CKMB, TROPONINI, MYOGLOBIN in the last 168 hours.  Invalid input(s): CK ------------------------------------------------------------------------------------------------------------------    Component Value Date/Time   BNP 41.5 10/03/2019 1413    Micro Results Recent Results (from the past 240 hour(s))  Urine Culture     Status: Abnormal   Collection Time: 10/02/19  8:20 PM   Specimen: Urine, Random  Result Value Ref Range Status   Specimen Description URINE, RANDOM  Final   Special Requests   Final    NONE Performed at [facility], [address], [place], [place] [address]    Culture MULTIPLE SPECIES PRESENT, SUGGEST RECOLLECTION (A)  Final   Report Status 10/04/2019 FINAL  Final    Radiology Reports Dg Chest 2 View  Result Date: 09/06/2019 CLINICAL DATA:  Shortness of breath EXAM: CHEST - 2 VIEW COMPARISON:  09/04/2019 FINDINGS: Lungs are hyperexpanded. Interstitial markings are diffusely coarsened with chronic features. Basilar scarring again noted. Nodular opacity projects over the posterior right sixth rib, not definitely seen on prior study. The cardiopericardial silhouette is within normal limits for size. The visualized bony structures of the thorax are intact. Telemetry leads overlie the chest. IMPRESSION: 1. Emphysema with  basilar scarring. 2. Nodular density at the right base. Patient had chest CT yesterday revealing no nodule at this location. Features on today's exam likely related to a confluence of shadows. Electronically Signed   By: Kennith Center M.D.   On: 09/06/2019 11:41   Ct Angio Chest Pe W/cm &/or Wo Cm  Result Date: 09/21/2019 CLINICAL DATA:  Shortness of breath, PE suspected, high pretest probability EXAM: CT ANGIOGRAPHY CHEST WITH CONTRAST TECHNIQUE: Multidetector CT imaging of the chest was performed using the standard protocol during bolus administration of intravenous contrast. Multiplanar CT image reconstructions and MIPs were obtained to evaluate the vascular anatomy. CONTRAST:  75mL OMNIPAQUE IOHEXOL 350 MG/ML SOLN COMPARISON:  Most recent CTA chest 09/05/2019 FINDINGS: Cardiovascular: Suboptimal opacification of the pulmonary arteries. Contrast opacifies to the level of the proximal segmental arteries with more distal evaluation limited by bolus and respiratory motion. No central, lobar or proximal segmental pulmonary arterial filling defects are seen. Central pulmonary arteries are normal caliber. Atherosclerotic plaque within the normal caliber aorta. Shared origin of the brachiocephalic and left common carotid. Atheromatous plaque within the proximal great vessels. Normal heart size. Small volume pericardial fluid is likely within  physiologic normal. Aortic leaflet calcifications are present. Coronary artery calcifications are present as well. Mediastinum/Nodes: Multiple reactive appearing though nonenlarged lymph nodes are present in the mediastinum and bilateral hila. Thyroid gland and thoracic inlet are unremarkable. Bowing of the posterior trachea reflects imaging during exhalation. No acute tracheal abnormality. The esophagus is unremarkable. Lungs/Pleura: There are patchy multifocal areas of mixed reticular interstitial and ground-glass opacity most pronounced in the periphery of the posterior  segments of both upper lobes, within the lingula and right lung base. Small bilateral pleural effusions are present as well. No pneumothorax. Upper Abdomen: Nodular thickening of the adrenal glands is similar to comparison studies No acute abnormalities present in the visualized portions of the upper abdomen. Musculoskeletal: Multilevel degenerative changes are present in the imaged portions of the spine. More severe changes are noted in the lower cervical spine, incompletely evaluated on this CT exam. Mild left and severe right osteoarthrosis of the shoulders. Review of the MIP images confirms the above findings. IMPRESSION: 1. Suboptimal opacification of the pulmonary arteries but without visible central, lobar or proximal segmental pulmonary arterial filling defects. 2. Patchy multifocal areas of mixed reticular interstitial and ground-glass opacity most pronounced in the periphery of the posterior segments of both upper lobes, within the lingula and right lung base, concerning for a possible acute infectious or inflammatory process. Reactive adenopathy present in the mediastinum and hila. 3. Small bilateral pleural effusions. 4. Nodular thickening of the adrenal glands likely reflecting senescent adrenal hyperplasia. 5. Aortic Atherosclerosis (ICD10-I70.0). Electronically Signed   By: Lovena Le M.D.   On: 09/21/2019 22:32   Nm Pulmonary Perf And Vent  Result Date: 09/06/2019 CLINICAL DATA:  Hypoxemia and respiratory failure. EXAM: NUCLEAR MEDICINE VENTILATION - PERFUSION LUNG SCAN TECHNIQUE: Ventilation images were obtained in multiple projections using inhaled aerosol Tc-49m DTPA. Perfusion images were obtained in multiple projections after intravenous injection of Tc-2m MAA. RADIOPHARMACEUTICALS:  39.3 mCi of Tc-73m DTPA aerosol inhalation and 1.6 mCi Tc13m MAA IV COMPARISON:  09/04/2019 FINDINGS: Ventilation: There is a large segmental ventilation defect involving the superior segment of right lower  lobe. Perfusion: Corresponding to the ventilation abnormality is a large a ventilation defect within the superior segment of right lower lobe. No unmatched wedge shaped peripheral perfusion defects to suggest acute pulmonary embolism. No corresponding chest radiograph abnormality. IMPRESSION: Low probability for acute pulmonary embolus. Electronically Signed   By: Kerby Moors M.D.   On: 09/06/2019 10:50   Dg Chest Portable 1 View  Result Date: 09/21/2019 CLINICAL DATA:  Shortness of breath. EXAM: PORTABLE CHEST 1 VIEW COMPARISON:  September 06, 2019. FINDINGS: The heart size and mediastinal contours are within normal limits. No pneumothorax or pleural effusion is noted. Increased bibasilar atelectasis or edema is noted. No pneumothorax or pleural effusion is noted. The visualized skeletal structures are unremarkable. IMPRESSION: Increased bibasilar atelectasis or edema is noted. Electronically Signed   By: Marijo Conception M.D.   On: 09/21/2019 16:48

## 2019-10-06 ENCOUNTER — Encounter (HOSPITAL_COMMUNITY): Payer: Self-pay

## 2019-10-06 DIAGNOSIS — U071 COVID-19: Secondary | ICD-10-CM | POA: Diagnosis not present

## 2019-10-06 LAB — GLUCOSE, CAPILLARY
Glucose-Capillary: 223 mg/dL — ABNORMAL HIGH (ref 70–99)
Glucose-Capillary: 132 mg/dL — ABNORMAL HIGH (ref 70–99)
Glucose-Capillary: 179 mg/dL — ABNORMAL HIGH (ref 70–99)
Glucose-Capillary: 116 mg/dL — ABNORMAL HIGH (ref 70–99)
Glucose-Capillary: 186 mg/dL — ABNORMAL HIGH (ref 70–99)

## 2019-10-06 LAB — CBC WITH DIFFERENTIAL/PLATELET
Abs Immature Granulocytes: 0.09 10*3/uL — ABNORMAL HIGH (ref 0.00–0.07)
Basophils Absolute: 0 10*3/uL (ref 0.0–0.1)
Basophils Relative: 0 %
Eosinophils Absolute: 0.1 10*3/uL (ref 0.0–0.5)
Eosinophils Relative: 1 %
HCT: 37 % — ABNORMAL LOW (ref 39.0–52.0)
Hemoglobin: 11.7 g/dL — ABNORMAL LOW (ref 13.0–17.0)
Immature Granulocytes: 1 %
Lymphocytes Relative: 18 %
Lymphs Abs: 1.3 10*3/uL (ref 0.7–4.0)
MCH: 28.6 pg (ref 26.0–34.0)
MCHC: 31.6 g/dL (ref 30.0–36.0)
MCV: 90.5 fL (ref 80.0–100.0)
Monocytes Absolute: 0.5 10*3/uL (ref 0.1–1.0)
Monocytes Relative: 7 %
Neutro Abs: 5.3 10*3/uL (ref 1.7–7.7)
Neutrophils Relative %: 73 %
Platelets: 203 10*3/uL (ref 150–400)
RBC: 4.09 MIL/uL — ABNORMAL LOW (ref 4.22–5.81)
RDW: 16.2 % — ABNORMAL HIGH (ref 11.5–15.5)
WBC: 7.2 10*3/uL (ref 4.0–10.5)
nRBC: 0 % (ref 0.0–0.2)

## 2019-10-06 LAB — COMPREHENSIVE METABOLIC PANEL
ALT: 25 U/L (ref 0–44)
AST: 15 U/L (ref 15–41)
Albumin: 2.4 g/dL — ABNORMAL LOW (ref 3.5–5.0)
Alkaline Phosphatase: 49 U/L (ref 38–126)
Anion gap: 9 (ref 5–15)
BUN: 38 mg/dL — ABNORMAL HIGH (ref 8–23)
CO2: 28 mmol/L (ref 22–32)
Calcium: 8.4 mg/dL — ABNORMAL LOW (ref 8.9–10.3)
Chloride: 96 mmol/L — ABNORMAL LOW (ref 98–111)
Creatinine, Ser: 1 mg/dL (ref 0.61–1.24)
GFR calc Af Amer: >60 mL/min (ref >60)
GFR calc non Af Amer: >60 mL/min (ref >60)
Glucose, Bld: 118 mg/dL — ABNORMAL HIGH (ref 70–99)
Potassium: 4.4 mmol/L (ref 3.5–5.1)
Sodium: 133 mmol/L — ABNORMAL LOW (ref 135–145)
Total Bilirubin: 0.8 mg/dL (ref 0.3–1.2)
Total Protein: 5.2 g/dL — ABNORMAL LOW (ref 6.5–8.1)

## 2019-10-06 LAB — MAGNESIUM: Magnesium: 2.2 mg/dL (ref 1.7–2.4)

## 2019-10-06 LAB — BRAIN NATRIURETIC PEPTIDE: B Natriuretic Peptide: 25.9 pg/mL (ref 0.0–100.0)

## 2019-10-06 MED ORDER — PNEUMOCOCCAL VAC POLYVALENT 25 MCG/0.5ML IJ INJ
0.5000 mL | INJECTION | INTRAMUSCULAR | Status: AC
  Administered 2019-10-07: 0.5 mL via INTRAMUSCULAR
  Filled 2019-10-06: qty 0.5

## 2019-10-06 MED ORDER — INFLUENZA VAC A&B SA ADJ QUAD 0.5 ML IM PRSY
0.5000 mL | PREFILLED_SYRINGE | INTRAMUSCULAR | Status: AC
  Administered 2019-10-07: 0.5 mL via INTRAMUSCULAR
  Filled 2019-10-06: qty 0.5

## 2019-10-06 NOTE — Progress Notes (Signed)
PROGRESS NOTE                                                                                                                                                                                                             Patient Demographics:    Aaron Daniels, is a 78 y.o. male, DOB - [birth year], ZOX:[id number]  Outpatient Primary MD for the patient is [name], MD   Admit date - 10/02/2019   LOS - 3  Chief Complaint  Patient presents with  . Covid    09/21/2019       Brief Narrative: Patient is a 78 y.o. male with PMHx of COPD on chronic hypoxic respiratory failure on home O2, chronic diastolic heart failure, HTN, DM-2-who was admitted to [facility] for COVID-19 pneumonia from 10/11-10/20-presented to the hospital for weakness.  Patient lives with his son who is his primary caregiver-unfortunately has COVID-19 and is being admitted to the hospital.   Subjective:   Patient in bed, appears comfortable, denies any headache, no fever, no chest pain or pressure, no shortness of breath , no abdominal pain. No focal weakness.   Assessment  & Plan :   Covid 19 infection: Appropriately treated and no acute issues from this problem.   Lab Results  Component Value Date   SARSCOV2NAA POSITIVE (A) 09/21/2019   [place] NEGATIVE 09/03/2019   [place] NEGATIVE 07/03/2019     COVID-19 Medications:  Steroids: 10/12>> 10/22 Remdesivir: 1012>> 10/16    Weakness and deconditioning.  Unable to take care of himself at home, family members living with him are all acutely sick with COVID-19 infection.  PT OT eval.  May require placement to SNF for safe disposition.  Note he was discharged home but could not take care of himself and came right back.  UTI: Urine culture is inconclusive being treated empirically with 5 days of antibiotics total.  Stop date 10/08/2019.  COPD on chronic hypoxemic respiratory failure:  Stable-continue bronchodilators  Chronic diastolic heart failure: Slightly dehydrated on 10/05/2019, hold Lasix, gently hydrate with IV fluids on 10/05/2019 and monitor.  Blood pressure slightly soft will monitor closely.  Suspicion for OSA: Stable for outpatient follow-up.  DM type II.  On ISS.  Outpatient control.   CBG (last 3)  Recent Labs    10/05/19 1634 10/05/19 2133 10/06/19 0750  GLUCAP  160* 222* 132*   Lab Results  Component Value Date   HGBA1C 6.5 (H) 09/04/2019     Condition - Stable  Family Communication  : None  Code Status : DNR  Diet :  Diet Order            Diet heart healthy/carb modified Room service appropriate? Yes; Fluid consistency: Thin  Diet effective now               Disposition Plan  :  Remain hospitalized, PT OT, may require SNF.  Barriers to discharge: Awaiting safe disposition plan by social work.  Consults  :  None  Procedures  :  None  Antibiotics  :    Anti-infectives (From admission, onward)   Start     Dose/Rate Route Frequency Ordered Stop   10/03/19 0200  cefTRIAXone (ROCEPHIN) 1 g in sodium chloride 0.9 % 100 mL IVPB     1 g 200 mL/hr over 30 Minutes Intravenous Daily at bedtime 10/03/19 0141       DVT Prophylaxis  :  Lovenox   Inpatient Medications  Scheduled Meds: . enoxaparin (LOVENOX) injection  40 mg Subcutaneous Daily  . famotidine  20 mg Oral Daily  . insulin aspart  0-5 Units Subcutaneous QHS  . insulin aspart  0-9 Units Subcutaneous TID WC  . mouth rinse  15 mL Mouth Rinse BID  . tamsulosin  0.4 mg Oral QPC supper  . umeclidinium-vilanterol  1 puff Inhalation Daily   Continuous Infusions: . cefTRIAXone (ROCEPHIN)  IV Stopped (10/06/19 0904)   PRN Meds:.acetaminophen **OR** [DISCONTINUED] acetaminophen, albuterol, ipratropium-albuterol   Time Spent in minutes  25  See all Orders from today for further details   Susa Raring M.D on 10/06/2019 at 9:24 AM  To page go to [email or web address] - use  universal password  [facility] -  Office  [phone]    Objective:   Vitals:   10/05/19 2300 10/06/19 0750 10/06/19 0817 10/06/19 0825  BP: 99/64 106/67    Pulse:  94 94   Resp:  (!) 21 17   Temp: 98.3 F (36.8 C) 97.6 F (36.4 C)    TempSrc: Oral Oral    SpO2:  91% 91% 94%  Weight:      Height:        Wt Readings from Last 3 Encounters:  10/03/19 96.1 kg  09/28/19 98.7 kg  09/15/19 104.3 kg     Intake/Output Summary (Last 24 hours) at 10/06/2019 0924 Last data filed at 10/06/2019 0904 Gross per 24 hour  Intake 1019.88 ml  Output 2700 ml  Net -1680.12 ml     Physical Exam  Awake Alert,  No new F.N deficits, Normal affect [place].AT,PERRAL Supple Neck,No JVD, No cervical lymphadenopathy appriciated.  Symmetrical Chest wall movement, Good air movement bilaterally, CTAB RRR,No Gallops, Rubs or new Murmurs, No Parasternal Heave +ve B.Sounds, Abd Soft, No tenderness, No organomegaly appriciated, No rebound - guarding or rigidity. No Cyanosis, Clubbing or edema, No new Rash or bruise   Data Review:    CBC Recent Labs  Lab 10/02/19 2016 10/06/19 0305  WBC 10.9* 7.2  HGB 14.2 11.7*  HCT 44.5 37.0*  PLT 335 203  MCV 89.4 90.5  MCH 28.5 28.6  MCHC 31.9 31.6  RDW 16.0* 16.2*  LYMPHSABS 0.9 1.3  MONOABS 0.7 0.5  EOSABS 0.0 0.1  BASOSABS 0.0 0.0    Chemistries  Recent Labs  Lab 09/30/19 0303 10/02/19 2016 10/04/19 0325 10/06/19 0305  NA  136 134* 135 133*  K 4.7 4.9 4.7 4.4  CL 95* 97* 97* 96*  CO2 31 26 29 28   GLUCOSE 139* 156* 143* 118*  BUN 38* 38* 38* 38*  CREATININE 1.03 1.16 1.00 1.00  CALCIUM 8.9 8.8* 8.7* 8.4*  MG  --   --   --  2.2  AST  --  22  --  15  ALT  --  53*  --  25  ALKPHOS  --  68  --  49  BILITOT  --  0.6  --  0.8   ------------------------------------------------------------------------------------------------------------------ No results for input(s): CHOL, HDL, LDLCALC, TRIG, CHOLHDL, LDLDIRECT in the last 72  hours.  Lab Results  Component Value Date   HGBA1C 6.5 (H) 09/04/2019   ------------------------------------------------------------------------------------------------------------------ No results for input(s): TSH, T4TOTAL, T3FREE, THYROIDAB in the last 72 hours.  Invalid input(s): FREET3 ------------------------------------------------------------------------------------------------------------------ No results for input(s): VITAMINB12, FOLATE, FERRITIN, TIBC, IRON, RETICCTPCT in the last 72 hours.  Coagulation profile No results for input(s): INR, PROTIME in the last 168 hours.  No results for input(s): DDIMER in the last 72 hours.  Cardiac Enzymes No results for input(s): CKMB, TROPONINI, MYOGLOBIN in the last 168 hours.  Invalid input(s): CK ------------------------------------------------------------------------------------------------------------------    Component Value Date/Time   BNP 25.9 10/06/2019 0305    Micro Results Recent Results (from the past 240 hour(s))  Urine Culture     Status: Abnormal   Collection Time: 10/02/19  8:20 PM   Specimen: Urine, Random  Result Value Ref Range Status   Specimen Description URINE, RANDOM  Final   Special Requests   Final    NONE Performed at [facility], [address] [place], [place]    Culture MULTIPLE SPECIES PRESENT, SUGGEST RECOLLECTION (A)  Final   Report Status 10/04/2019 FINAL  Final    Radiology Reports Ct Angio Chest Pe W/cm &/or Wo Cm  Result Date: 09/21/2019 CLINICAL DATA:  Shortness of breath, PE suspected, high pretest probability EXAM: CT ANGIOGRAPHY CHEST WITH CONTRAST TECHNIQUE: Multidetector CT imaging of the chest was performed using the standard protocol during bolus administration of intravenous contrast. Multiplanar CT image reconstructions and MIPs were obtained to evaluate the vascular anatomy. CONTRAST:  83mL OMNIPAQUE IOHEXOL 350 MG/ML SOLN COMPARISON:  Most recent CTA chest  09/05/2019 FINDINGS: Cardiovascular: Suboptimal opacification of the pulmonary arteries. Contrast opacifies to the level of the proximal segmental arteries with more distal evaluation limited by bolus and respiratory motion. No central, lobar or proximal segmental pulmonary arterial filling defects are seen. Central pulmonary arteries are normal caliber. Atherosclerotic plaque within the normal caliber aorta. Shared origin of the brachiocephalic and left common carotid. Atheromatous plaque within the proximal great vessels. Normal heart size. Small volume pericardial fluid is likely within physiologic normal. Aortic leaflet calcifications are present. Coronary artery calcifications are present as well. Mediastinum/Nodes: Multiple reactive appearing though nonenlarged lymph nodes are present in the mediastinum and bilateral hila. Thyroid gland and thoracic inlet are unremarkable. Bowing of the posterior trachea reflects imaging during exhalation. No acute tracheal abnormality. The esophagus is unremarkable. Lungs/Pleura: There are patchy multifocal areas of mixed reticular interstitial and ground-glass opacity most pronounced in the periphery of the posterior segments of both upper lobes, within the lingula and right lung base. Small bilateral pleural effusions are present as well. No pneumothorax. Upper Abdomen: Nodular thickening of the adrenal glands is similar to comparison studies No acute abnormalities present in the visualized portions of the upper abdomen. Musculoskeletal: Multilevel  degenerative changes are present in the imaged portions of the spine. More severe changes are noted in the lower cervical spine, incompletely evaluated on this CT exam. Mild left and severe right osteoarthrosis of the shoulders. Review of the MIP images confirms the above findings. IMPRESSION: 1. Suboptimal opacification of the pulmonary arteries but without visible central, lobar or proximal segmental pulmonary arterial filling  defects. 2. Patchy multifocal areas of mixed reticular interstitial and ground-glass opacity most pronounced in the periphery of the posterior segments of both upper lobes, within the lingula and right lung base, concerning for a possible acute infectious or inflammatory process. Reactive adenopathy present in the mediastinum and hila. 3. Small bilateral pleural effusions. 4. Nodular thickening of the adrenal glands likely reflecting senescent adrenal hyperplasia. 5. Aortic Atherosclerosis (ICD10-I70.0). Electronically Signed   By: Kreg ShropshirePrice  DeHay M.D.   On: 09/21/2019 22:32   Nm Pulmonary Perf And Vent  Result Date: 09/06/2019 CLINICAL DATA:  Hypoxemia and respiratory failure. EXAM: NUCLEAR MEDICINE VENTILATION - PERFUSION LUNG SCAN TECHNIQUE: Ventilation images were obtained in multiple projections using inhaled aerosol Tc-572m DTPA. Perfusion images were obtained in multiple projections after intravenous injection of Tc-232m MAA. RADIOPHARMACEUTICALS:  39.3 mCi of Tc-942m DTPA aerosol inhalation and 1.6 mCi Tc702m MAA IV COMPARISON:  09/04/2019 FINDINGS: Ventilation: There is a large segmental ventilation defect involving the superior segment of right lower lobe. Perfusion: Corresponding to the ventilation abnormality is a large a ventilation defect within the superior segment of right lower lobe. No unmatched wedge shaped peripheral perfusion defects to suggest acute pulmonary embolism. No corresponding chest radiograph abnormality. IMPRESSION: Low probability for acute pulmonary embolus. Electronically Signed   By: Signa Kellaylor  Stroud M.D.   On: 09/06/2019 10:50   Dg Chest Portable 1 View  Result Date: 09/21/2019 CLINICAL DATA:  Shortness of breath. EXAM: PORTABLE CHEST 1 VIEW COMPARISON:  September 06, 2019. FINDINGS: The heart size and mediastinal contours are within normal limits. No pneumothorax or pleural effusion is noted. Increased bibasilar atelectasis or edema is noted. No pneumothorax or pleural effusion  is noted. The visualized skeletal structures are unremarkable. IMPRESSION: Increased bibasilar atelectasis or edema is noted. Electronically Signed   By: Lupita RaiderJames  Green Jr M.D.   On: 09/21/2019 16:48

## 2019-10-06 NOTE — TOC Initial Note (Signed)
Transition of Care [facility]) - Initial/Assessment Note    Patient Details  Name: Aaron Daniels MRN: [id number] Date of Birth: [birth year]  Transition of Care [facility]) CM/SW Contact:    Weston Anna, LCSW Phone Number: 10/06/2019, 9:41 AM  Clinical Narrative:                  CSW familiar with patient from previous admission- patient has APS worker due to hoarding [facility] APS caseworker [place] [phone]). APS states patient is able to make his own decisions regarding his care at this time.   PT is currently recommending HH at discharge and patient already has [facility] services following him. CSW in unable to place patient in a SNF due to PT recommending [facility]. Patient is alert/oriented X4 and he is able to make his own decisions regarding discharge planning.   Expected Discharge Plan: [facility](home vs SNF) Barriers to Discharge: Continued Medical Work up   Patient Goals and CMS Choice        Expected Discharge Plan and Services Expected Discharge Plan: [facility](home vs SNF)                                              Prior Living Arrangements/Services                       Activities of Daily Living      Permission Sought/Granted                  Emotional Assessment              Admission diagnosis:  COVID-19 virus infection [U07.1] Patient Active Problem List   Diagnosis Date Noted  . UTI (urinary tract infection) 10/03/2019  . COVID-19 virus infection 10/03/2019  . Hypoxia 09/21/2019  . COVID-19 09/21/2019  . Diarrhea 09/17/2019  . Snoring 09/15/2019  . COPD (chronic obstructive pulmonary disease) ([place]) 09/11/2019  . HTN (hypertension) 09/11/2019  . Heart failure with preserved ejection fraction ([place]) 09/07/2019  . COPD exacerbation ([place]) 09/07/2019  . Dyspnea 09/04/2019  . Chronic respiratory failure with hypoxia ([place]) 09/03/2019   PCP:  [name], MD Pharmacy:   [id number] [place], [place] [address] [place] [address] Phone: [phone] Fax: [phone]     Social Determinants of Health (SDOH) Interventions    Readmission Risk Interventions No flowsheet data found.

## 2019-10-06 NOTE — Progress Notes (Signed)
Physical Therapy Treatment Patient Details Name: Aaron Daniels MRN: [id number] DOB: 05-Mar-1941 Today's Date: 10/06/2019    History of Present Illness 78 y/o male recently d/c home on 09/28 then 10/20  after hospitalization w/ hypoxic respiratory failure. returned to ED w/ worsening SOB, admitted with acute CHF exacerbation then found to be COVID +PMHx: COPD on 3L/min at home, HTN, BPH s/p TURP, CHF    PT Comments    Pt was able to walk to his son's room with the support of the RW and 2 L O2 [name] to maintain sats >88%.  DOE 2/4, HR controlled (peds finger probe monitoring).  Pt is hopeful to d/c home with the rest of his family (wife and son are also admitted) tomorrow.  PT will continue to follow acutely for safe mobility progression   Follow Up Recommendations  Home health PT     Equipment Recommendations  Other (comment)(home O2?)    Recommendations for Other Services OT consult     Precautions / Restrictions Precautions Precautions: Fall;Other (comment) Precaution Comments: a-fib HR flutuates extremely w/ activity, monitor O2 sats, do ambulatory sat note    Mobility  Bed Mobility Overal bed mobility: Modified Independent             General bed mobility comments: Extra time and use of the railing for support.   Transfers Overall transfer level: Needs assistance Equipment used: Rolling walker (2 wheeled) Transfers: Sit to/from Stand Sit to Stand: Min guard         General transfer comment: Min guard assist for safety and to stabilize the RW .  Ambulation/Gait Ambulation/Gait assistance: Min guard Gait Distance (Feet): 100 Feet Assistive device: Rolling walker (2 wheeled) Gait Pattern/deviations: Step-through pattern;Shuffle;Trunk flexed Gait velocity: decreased Gait velocity interpretation: 1.31 - 2.62 ft/sec, indicative of limited community ambulator General Gait Details: Pt with slow, shuffling gait pattern, needed 2 L O2 [place] to keep sats >88% while walking,  DOE 2/4.            Balance Overall balance assessment: Needs assistance Sitting-balance support: Feet supported;No upper extremity supported Sitting balance-Leahy Scale: Good     Standing balance support: Bilateral upper extremity supported Standing balance-Leahy Scale: Poor Standing balance comment: needs external support in standing.                             Cognition Arousal/Alertness: Awake/alert Behavior During Therapy: WFL for tasks assessed/performed Overall Cognitive Status: Within Functional Limits for tasks assessed                                 General Comments: He is a bit HOH             Pertinent Vitals/Pain Pain Assessment: Faces Faces Pain Scale: Hurts little more Pain Location: back and buttocks from sitting in the recliner.  Pain Descriptors / Indicators: Aching;Burning Pain Intervention(s): Limited activity within patient's tolerance;Monitored during session;Repositioned           PT Goals (current goals can now be found in the care plan section) Acute Rehab PT Goals Patient Stated Goal: discharge home with his family tomorrow (wife and son are also here) Progress towards PT goals: Progressing toward goals    Frequency    Min 3X/week      PT Plan Current plan remains appropriate       AM-PAC PT "6 Clicks" Mobility  Outcome Measure  Help needed turning from your back to your side while in a flat bed without using bedrails?: A Little Help needed moving from lying on your back to sitting on the side of a flat bed without using bedrails?: A Little Help needed moving to and from a bed to a chair (including a wheelchair)?: A Little Help needed standing up from a chair using your arms (e.g., wheelchair or bedside chair)?: A Little Help needed to walk in hospital room?: A Little Help needed climbing 3-5 steps with a railing? : A Little 6 Click Score: 18    End of Session Equipment Utilized During  Treatment: Oxygen Activity Tolerance: Patient limited by fatigue;Patient limited by pain Patient left: in chair;with call bell/phone within reach;with nursing/sitter in room Nurse Communication: Mobility status PT Visit Diagnosis: Unsteadiness on feet (R26.81);Other abnormalities of gait and mobility (R26.89);Muscle weakness (generalized) (M62.81)     Time: 1713-1823(much of this time spent visitng son and wife) PT Time Calculation (min) (ACUTE ONLY): 70 min  Charges:  $Gait Training: 23-37 mins          Orion Vandervort B. Donyale Falcon, PT, DPT  Acute Rehabilitation [phone] pager [phone] office  @ [facility]: [phone]             10/06/2019, 6:57 PM

## 2019-10-06 NOTE — Progress Notes (Signed)
SATURATION QUALIFICATIONS: (This note is used to comply with regulatory documentation for home oxygen)  Patient Saturations on Room Air at Rest = 90%  Patient Saturations on Room Air while Ambulating = 87%  Patient Saturations on 2 Liters of oxygen while Ambulating = 88%  Please briefly explain why patient needs home oxygen: Pt desaturates on RA while walking.   Barbarann Ehlers Alpa Salvo, PT, DPT  Acute Rehabilitation [phone] pager [phone]) [phone] office  @ [facility]: [phone]

## 2019-10-06 NOTE — Progress Notes (Addendum)
0730: Assumed care of Pt from night RN.  0815: Pt assessed, denies pain. VSS, SpO2 91% on room air. AM meds administered. Pt denies any other needs at this time. Will continue to monitor  0925: Pt assisted out of bed to chair with no complications. Set up and independently brushed teeth. Denies needs at this time. Instructed to call for assistance. Call bell within reach  1200: Pt stable. Denies needs at this time. Will continue to monitor  1450: Pt assisted back to bed per Pt request. No other needs at this time.  [id number]: Pt working with PT. Back in room wants to remain in the chair to eat dinner. States will call when ready to get back to bed. Call bell within reach

## 2019-10-07 DIAGNOSIS — U071 COVID-19: Secondary | ICD-10-CM | POA: Diagnosis not present

## 2019-10-07 LAB — COMPREHENSIVE METABOLIC PANEL
ALT: 25 U/L (ref 0–44)
AST: 15 U/L (ref 15–41)
Albumin: 2.3 g/dL — ABNORMAL LOW (ref 3.5–5.0)
Alkaline Phosphatase: 51 U/L (ref 38–126)
Anion gap: 9 (ref 5–15)
BUN: 27 mg/dL — ABNORMAL HIGH (ref 8–23)
CO2: 25 mmol/L (ref 22–32)
Calcium: 8.6 mg/dL — ABNORMAL LOW (ref 8.9–10.3)
Chloride: 100 mmol/L (ref 98–111)
Creatinine, Ser: 0.91 mg/dL (ref 0.61–1.24)
GFR calc Af Amer: >60 mL/min (ref >60)
GFR calc non Af Amer: >60 mL/min (ref >60)
Glucose, Bld: 128 mg/dL — ABNORMAL HIGH (ref 70–99)
Potassium: 4.3 mmol/L (ref 3.5–5.1)
Sodium: 134 mmol/L — ABNORMAL LOW (ref 135–145)
Total Bilirubin: 0.7 mg/dL (ref 0.3–1.2)
Total Protein: 5.2 g/dL — ABNORMAL LOW (ref 6.5–8.1)

## 2019-10-07 LAB — CBC WITH DIFFERENTIAL/PLATELET
Abs Immature Granulocytes: 0.1 10*3/uL — ABNORMAL HIGH (ref 0.00–0.07)
Basophils Absolute: 0 10*3/uL (ref 0.0–0.1)
Basophils Relative: 0 %
Eosinophils Absolute: 0.1 10*3/uL (ref 0.0–0.5)
Eosinophils Relative: 1 %
HCT: 36.6 % — ABNORMAL LOW (ref 39.0–52.0)
Hemoglobin: 11.7 g/dL — ABNORMAL LOW (ref 13.0–17.0)
Immature Granulocytes: 2 %
Lymphocytes Relative: 17 %
Lymphs Abs: 1.1 10*3/uL (ref 0.7–4.0)
MCH: 28.7 pg (ref 26.0–34.0)
MCHC: 32 g/dL (ref 30.0–36.0)
MCV: 89.7 fL (ref 80.0–100.0)
Monocytes Absolute: 0.5 10*3/uL (ref 0.1–1.0)
Monocytes Relative: 8 %
Neutro Abs: 4.9 10*3/uL (ref 1.7–7.7)
Neutrophils Relative %: 72 %
Platelets: 169 10*3/uL (ref 150–400)
RBC: 4.08 MIL/uL — ABNORMAL LOW (ref 4.22–5.81)
RDW: 16.2 % — ABNORMAL HIGH (ref 11.5–15.5)
WBC: 6.7 10*3/uL (ref 4.0–10.5)
nRBC: 0 % (ref 0.0–0.2)

## 2019-10-07 LAB — GLUCOSE, CAPILLARY
Glucose-Capillary: 119 mg/dL — ABNORMAL HIGH (ref 70–99)
Glucose-Capillary: 231 mg/dL — ABNORMAL HIGH (ref 70–99)
Glucose-Capillary: 198 mg/dL — ABNORMAL HIGH (ref 70–99)
Glucose-Capillary: 134 mg/dL — ABNORMAL HIGH (ref 70–99)

## 2019-10-07 LAB — BRAIN NATRIURETIC PEPTIDE: B Natriuretic Peptide: 38.9 pg/mL (ref 0.0–100.0)

## 2019-10-07 LAB — MAGNESIUM: Magnesium: 2.1 mg/dL (ref 1.7–2.4)

## 2019-10-07 MED ORDER — LACTATED RINGERS IV SOLN
INTRAVENOUS | Status: AC
  Administered 2019-10-07: 11:00:00 via INTRAVENOUS

## 2019-10-07 NOTE — Progress Notes (Signed)
CSW had extensive conversation with patients son, [name] (was also patient at hospital) regarding patients home life- son states patient/ spouse both are hoarders and do not care for their home very well. Son is currently staying in his truck outside of their home because he prefers to sleep there. Son states he prefers not to get in the middle of making decisions for his father and "he will do what he wants".   CSW left voicemail to notify APS that patient was back at hospital however during previous admission APS worker informed CSW that patient was able to make his own decisions and could return home if he chooses.   Kingsley Spittle, LCSW [facility] [place]  [phone]

## 2019-10-07 NOTE — Progress Notes (Addendum)
[id number]: Assumed care of Pt from night RN. Pt assessed, denies pain. VSS, SpO2 91% on room air. Pt denies any other needs at this time. Will continue to monitor  0925: Placed on 2L [place] per order. Pt assisted out of bed to [facility] then chair with no complications. Set up and independently brushed teeth. Denies needs at this time. Instructed to call for assistance. Call bell within reach  1200: Pt remains out of bed to chair per request. Call bell within reach  1800: Called [place] with no answer, left message to call back.  Pt resting comfortably in bed, call bell within reach.

## 2019-10-07 NOTE — Progress Notes (Signed)
PROGRESS NOTE                                                                                                                                                                                                             Patient Demographics:    Aaron Daniels, is a 78 y.o. male, DOB - September 17, 1941, PYP:[id number]  Outpatient Primary MD for the patient is [name], MD   Admit date - 10/02/2019   LOS - 4  Chief Complaint  Patient presents with  . Covid    09/21/2019       Brief Narrative: Patient is a 78 y.o. male with PMHx of COPD on chronic hypoxic respiratory failure on home O2, chronic diastolic heart failure, HTN, DM-2-who was admitted to [facility] for COVID-19 pneumonia from 10/11-10/20-presented to the hospital for weakness.  Patient lives with his son who is his primary caregiver-unfortunately has COVID-19 and is being admitted to the hospital.   Subjective:   Patient in bed, appears comfortable, denies any headache, no fever, no chest pain or pressure, no shortness of breath , no abdominal pain. No focal weakness.   Assessment  & Plan :   Covid 19 infection: Appropriately treated and no acute issues from this problem.   Lab Results  Component Value Date   SARSCOV2NAA POSITIVE (A) 09/21/2019   [place] NEGATIVE 09/03/2019   [place] NEGATIVE 07/03/2019     COVID-19 Medications:  Steroids: 10/12>> 10/22 Remdesivir: 1012>> 10/16    Weakness and deconditioning.  Unable to take care of himself at home, family members living with him are all acutely sick with COVID-19 infection.  PT OT eval.  May require placement to SNF for safe disposition.  Note he was discharged home but could not take care of himself and came right back.  Had detailed discussions with social worker on 10/07/2019.  UTI: Urine culture is inconclusive being treated empirically with 5 days of antibiotics total.  Stop date  10/08/2019.  COPD on chronic hypoxemic respiratory failure: Stable-continue bronchodilators  Chronic diastolic heart failure: Blood pressure soft, hold Lasix and hydrate gently.  Currently compensated.  Suspicion for OSA: Stable for outpatient follow-up.  DM type II.  On ISS.  Outpatient control.   CBG (last 3)  Recent Labs    10/06/19 1550 10/06/19 2045 10/07/19 0753  GLUCAP 116* 186* 119*  Lab Results  Component Value Date   HGBA1C 6.5 (H) 09/04/2019     Condition - Stable  Family Communication  : None  Code Status : DNR  Diet :  Diet Order            Diet heart healthy/carb modified Room service appropriate? Yes; Fluid consistency: Thin  Diet effective now               Disposition Plan  :  Remain hospitalized, PT OT, may require SNF.  Barriers to discharge: Awaiting safe disposition plan by social work.  Consults  :  None  Procedures  :  None  Antibiotics  :    Anti-infectives (From admission, onward)   Start     Dose/Rate Route Frequency Ordered Stop   10/03/19 0200  cefTRIAXone (ROCEPHIN) 1 g in sodium chloride 0.9 % 100 mL IVPB     1 g 200 mL/hr over 30 Minutes Intravenous Daily at bedtime 10/03/19 0141       DVT Prophylaxis  :  Lovenox   Inpatient Medications  Scheduled Meds: . enoxaparin (LOVENOX) injection  40 mg Subcutaneous Daily  . famotidine  20 mg Oral Daily  . insulin aspart  0-5 Units Subcutaneous QHS  . insulin aspart  0-9 Units Subcutaneous TID WC  . mouth rinse  15 mL Mouth Rinse BID  . tamsulosin  0.4 mg Oral QPC supper  . umeclidinium-vilanterol  1 puff Inhalation Daily   Continuous Infusions: . cefTRIAXone (ROCEPHIN)  IV 1 g (10/06/19 2058)   PRN Meds:.acetaminophen **OR** [DISCONTINUED] acetaminophen, albuterol, ipratropium-albuterol   Time Spent in minutes  25  See all Orders from today for further details   Susa Raring M.D on 10/07/2019 at 10:08 AM  To page go to [email or web address] - use universal password   [facility] -  Office  [phone]    Objective:   Vitals:   10/06/19 1826 10/06/19 2049 10/07/19 0450 10/07/19 0750  BP:  93/71 101/74 97/70  Pulse: (!) 104 98 94 98  Resp: Temp:  98.5 F (36.9 C) 98.8 F (37.1 C) 98.4 F (36.9 C)  TempSrc:  Oral Oral Oral  SpO2: 94% 92% 91% 90%  Weight:      Height:        Wt Readings from Last 3 Encounters:  10/03/19 96.1 kg  09/28/19 98.7 kg  09/15/19 104.3 kg     Intake/Output Summary (Last 24 hours) at 10/07/2019 1008 Last data filed at 10/07/2019 0753 Gross per 24 hour  Intake 1440.08 ml  Output 3150 ml  Net -1709.92 ml     Physical Exam  Awake Alert,  No new F.N deficits, Normal affect [place].AT,PERRAL Supple Neck,No JVD, No cervical lymphadenopathy appriciated.  Symmetrical Chest wall movement, Good air movement bilaterally, CTAB RRR,No Gallops, Rubs or new Murmurs, No Parasternal Heave +ve B.Sounds, Abd Soft, No tenderness, No organomegaly appriciated, No rebound - guarding or rigidity. No Cyanosis, Clubbing or edema, No new Rash or bruise    Data Review:    CBC Recent Labs  Lab 10/02/19 2016 10/06/19 0305 10/07/19 0455  WBC 10.9* 7.2 6.7  HGB 14.2 11.7* 11.7*  HCT 44.5 37.0* 36.6*  PLT 335 203 169  MCV 89.4 90.5 89.7  MCH 28.5 28.6 28.7  MCHC 31.9 31.6 32.0  RDW 16.0* 16.2* 16.2*  LYMPHSABS 0.9 1.3 1.1  MONOABS 0.7 0.5 0.5  EOSABS 0.0 0.1 0.1  BASOSABS 0.0 0.0 0.0    Chemistries  Recent Labs  Lab 10/02/19 2016 10/04/19 0325 10/06/19 0305 10/07/19 0455  NA 134* 135 133* 134*  K 4.9 4.7 4.4 4.3  CL 97* 97* 96* 100  CO2 26 29 28 25   GLUCOSE 156* 143* 118* 128*  BUN 38* 38* 38* 27*  CREATININE 1.16 1.00 1.00 0.91  CALCIUM 8.8* 8.7* 8.4* 8.6*  MG  --   --  2.2 2.1  AST 22  --  15 15  ALT 53*  --  25 25  ALKPHOS 68  --  49 51  BILITOT 0.6  --  0.8 0.7   ------------------------------------------------------------------------------------------------------------------ No  results for input(s): CHOL, HDL, LDLCALC, TRIG, CHOLHDL, LDLDIRECT in the last 72 hours.  Lab Results  Component Value Date   HGBA1C 6.5 (H) 09/04/2019   ------------------------------------------------------------------------------------------------------------------ No results for input(s): TSH, T4TOTAL, T3FREE, THYROIDAB in the last 72 hours.  Invalid input(s): FREET3 ------------------------------------------------------------------------------------------------------------------ No results for input(s): VITAMINB12, FOLATE, FERRITIN, TIBC, IRON, RETICCTPCT in the last 72 hours.  Coagulation profile No results for input(s): INR, PROTIME in the last 168 hours.  No results for input(s): DDIMER in the last 72 hours.  Cardiac Enzymes No results for input(s): CKMB, TROPONINI, MYOGLOBIN in the last 168 hours.  Invalid input(s): CK ------------------------------------------------------------------------------------------------------------------    Component Value Date/Time   BNP 38.9 10/07/2019 0455    Micro Results Recent Results (from the past 240 hour(s))  Urine Culture     Status: Abnormal   Collection Time: 10/02/19  8:20 PM   Specimen: Urine, Random  Result Value Ref Range Status   Specimen Description URINE, RANDOM  Final   Special Requests   Final    NONE Performed at [facility], [address], [place], [place]    Culture MULTIPLE SPECIES PRESENT, SUGGEST RECOLLECTION (A)  Final   Report Status 10/04/2019 FINAL  Final    Radiology Reports Ct Angio Chest Pe W/cm &/or Wo Cm  Result Date: 09/21/2019 CLINICAL DATA:  Shortness of breath, PE suspected, high pretest probability EXAM: CT ANGIOGRAPHY CHEST WITH CONTRAST TECHNIQUE: Multidetector CT imaging of the chest was performed using the standard protocol during bolus administration of intravenous contrast. Multiplanar CT image reconstructions and MIPs were obtained to evaluate the vascular anatomy.  CONTRAST:  38mL OMNIPAQUE IOHEXOL 350 MG/ML SOLN COMPARISON:  Most recent CTA chest 09/05/2019 FINDINGS: Cardiovascular: Suboptimal opacification of the pulmonary arteries. Contrast opacifies to the level of the proximal segmental arteries with more distal evaluation limited by bolus and respiratory motion. No central, lobar or proximal segmental pulmonary arterial filling defects are seen. Central pulmonary arteries are normal caliber. Atherosclerotic plaque within the normal caliber aorta. Shared origin of the brachiocephalic and left common carotid. Atheromatous plaque within the proximal great vessels. Normal heart size. Small volume pericardial fluid is likely within physiologic normal. Aortic leaflet calcifications are present. Coronary artery calcifications are present as well. Mediastinum/Nodes: Multiple reactive appearing though nonenlarged lymph nodes are present in the mediastinum and bilateral hila. Thyroid gland and thoracic inlet are unremarkable. Bowing of the posterior trachea reflects imaging during exhalation. No acute tracheal abnormality. The esophagus is unremarkable. Lungs/Pleura: There are patchy multifocal areas of mixed reticular interstitial and ground-glass opacity most pronounced in the periphery of the posterior segments of both upper lobes, within the lingula and right lung base. Small bilateral pleural effusions are present as well. No pneumothorax. Upper Abdomen: Nodular thickening of the adrenal glands is similar to comparison studies No acute abnormalities present in the visualized portions of the  upper abdomen. Musculoskeletal: Multilevel degenerative changes are present in the imaged portions of the spine. More severe changes are noted in the lower cervical spine, incompletely evaluated on this CT exam. Mild left and severe right osteoarthrosis of the shoulders. Review of the MIP images confirms the above findings. IMPRESSION: 1. Suboptimal opacification of the pulmonary arteries  but without visible central, lobar or proximal segmental pulmonary arterial filling defects. 2. Patchy multifocal areas of mixed reticular interstitial and ground-glass opacity most pronounced in the periphery of the posterior segments of both upper lobes, within the lingula and right lung base, concerning for a possible acute infectious or inflammatory process. Reactive adenopathy present in the mediastinum and hila. 3. Small bilateral pleural effusions. 4. Nodular thickening of the adrenal glands likely reflecting senescent adrenal hyperplasia. 5. Aortic Atherosclerosis (ICD10-I70.0). Electronically Signed   By: Kreg ShropshirePrice  DeHay M.D.   On: 09/21/2019 22:32   Dg Chest Portable 1 View  Result Date: 09/21/2019 CLINICAL DATA:  Shortness of breath. EXAM: PORTABLE CHEST 1 VIEW COMPARISON:  September 06, 2019. FINDINGS: The heart size and mediastinal contours are within normal limits. No pneumothorax or pleural effusion is noted. Increased bibasilar atelectasis or edema is noted. No pneumothorax or pleural effusion is noted. The visualized skeletal structures are unremarkable. IMPRESSION: Increased bibasilar atelectasis or edema is noted. Electronically Signed   By: Lupita RaiderJames  Green Jr M.D.   On: 09/21/2019 16:48

## 2019-10-08 ENCOUNTER — Inpatient Hospital Stay (HOSPITAL_COMMUNITY): Payer: Medicare Other

## 2019-10-08 ENCOUNTER — Encounter (HOSPITAL_COMMUNITY): Payer: Self-pay

## 2019-10-08 DIAGNOSIS — U071 COVID-19: Secondary | ICD-10-CM | POA: Diagnosis not present

## 2019-10-08 LAB — CBC WITH DIFFERENTIAL/PLATELET
Abs Immature Granulocytes: 0.11 10*3/uL — ABNORMAL HIGH (ref 0.00–0.07)
Basophils Absolute: 0 10*3/uL (ref 0.0–0.1)
Basophils Relative: 0 %
Eosinophils Absolute: 0.1 10*3/uL (ref 0.0–0.5)
Eosinophils Relative: 1 %
HCT: 34 % — ABNORMAL LOW (ref 39.0–52.0)
Hemoglobin: 10.9 g/dL — ABNORMAL LOW (ref 13.0–17.0)
Immature Granulocytes: 2 %
Lymphocytes Relative: 16 %
Lymphs Abs: 1.1 10*3/uL (ref 0.7–4.0)
MCH: 28.8 pg (ref 26.0–34.0)
MCHC: 32.1 g/dL (ref 30.0–36.0)
MCV: 89.9 fL (ref 80.0–100.0)
Monocytes Absolute: 0.5 10*3/uL (ref 0.1–1.0)
Monocytes Relative: 8 %
Neutro Abs: 4.8 10*3/uL (ref 1.7–7.7)
Neutrophils Relative %: 73 %
Platelets: 160 10*3/uL (ref 150–400)
RBC: 3.78 MIL/uL — ABNORMAL LOW (ref 4.22–5.81)
RDW: 16.1 % — ABNORMAL HIGH (ref 11.5–15.5)
WBC: 6.6 10*3/uL (ref 4.0–10.5)
nRBC: 0 % (ref 0.0–0.2)

## 2019-10-08 LAB — GLUCOSE, CAPILLARY
Glucose-Capillary: 202 mg/dL — ABNORMAL HIGH (ref 70–99)
Glucose-Capillary: 215 mg/dL — ABNORMAL HIGH (ref 70–99)
Glucose-Capillary: 164 mg/dL — ABNORMAL HIGH (ref 70–99)

## 2019-10-08 LAB — COMPREHENSIVE METABOLIC PANEL
ALT: 24 U/L (ref 0–44)
AST: 14 U/L — ABNORMAL LOW (ref 15–41)
Albumin: 2.2 g/dL — ABNORMAL LOW (ref 3.5–5.0)
Alkaline Phosphatase: 48 U/L (ref 38–126)
Anion gap: 6 (ref 5–15)
BUN: 23 mg/dL (ref 8–23)
CO2: 26 mmol/L (ref 22–32)
Calcium: 8.2 mg/dL — ABNORMAL LOW (ref 8.9–10.3)
Chloride: 101 mmol/L (ref 98–111)
Creatinine, Ser: 0.88 mg/dL (ref 0.61–1.24)
GFR calc Af Amer: >60 mL/min (ref >60)
GFR calc non Af Amer: >60 mL/min (ref >60)
Glucose, Bld: 126 mg/dL — ABNORMAL HIGH (ref 70–99)
Potassium: 4.5 mmol/L (ref 3.5–5.1)
Sodium: 133 mmol/L — ABNORMAL LOW (ref 135–145)
Total Bilirubin: 0.6 mg/dL (ref 0.3–1.2)
Total Protein: 5.1 g/dL — ABNORMAL LOW (ref 6.5–8.1)

## 2019-10-08 LAB — MAGNESIUM: Magnesium: 2 mg/dL (ref 1.7–2.4)

## 2019-10-08 LAB — BRAIN NATRIURETIC PEPTIDE: B Natriuretic Peptide: 33.5 pg/mL (ref 0.0–100.0)

## 2019-10-08 LAB — PROCALCITONIN: Procalcitonin: <0.1 ng/mL

## 2019-10-08 NOTE — Progress Notes (Signed)
Occupational Therapy Treatment Patient Details Name: Aaron Daniels MRN: [id number] DOB: 01-Mar-1941 Today's Date: 10/08/2019    History of present illness 78 y/o male recently d/c home on 09/28 then 10/20  after hospitalization w/ hypoxic respiratory failure. returned to ED w/ worsening SOB, admitted with acute CHF exacerbation then found to be COVID +PMHx: COPD on 3L/min at home, HTN, BPH s/p TURP, CHF   OT comments  Pt progressing with ADL and mobility, however desats to 87 on RA , requires 2L to maintain in 90s. Pt with apparent cognitive deficits noted. Recommend S with all medication management. Recommend HHOT/NSG and SW. Will continue to follow acutely.   Follow Up Recommendations  Home health OT;Supervision - Intermittent    Equipment Recommendations  None recommended by OT    Recommendations for Other Services      Precautions / Restrictions Precautions Precautions: Fall;Other (comment) Precaution Comments: a-fib HR flutuates extremely w/ activity, monitor O2 sats, do ambulatory sat note       Mobility Bed Mobility Overal bed mobility: Modified Independent                Transfers Overall transfer level: Needs assistance Equipment used: 1 person hand held assist Transfers: Sit to/from Stand Sit to Stand: Min guard              Balance Overall balance assessment: Needs assistance   Sitting balance-Leahy Scale: Fair       Standing balance-Leahy Scale: Poor                             ADL either performed or assessed with clinical judgement   ADL Overall ADL's : Needs assistance/impaired     Grooming: Set up;Standing   Upper Body Bathing: Set up;Standing   Lower Body Bathing: Sit to/from stand;Minimal assistance Lower Body Bathing Details (indicate cue type and reason): A to thouroughly clean buttocks Upper Body Dressing : Set up;Sitting   Lower Body Dressing: Min guard;Sit to/from stand   Toilet Transfer: Min guard;Ambulation    Toileting- Clothing Manipulation and Hygiene: Minimal assistance Toileting - Clothing Manipulation Details (indicate cue type and reason): unaware of being soiled; A to completely clean     Functional mobility during ADLs: Minimal assistance;Cueing for safety General ADL Comments: Unsteady. reaching out for furniture/cabinets     Vision       Perception     Praxis      Cognition Arousal/Alertness: Awake/alert Behavior During Therapy: WFL for tasks assessed/performed Overall Cognitive Status: No family/caregiver present to determine baseline cognitive functioning Area of Impairment: Memory;Safety/judgement;Awareness;Problem solving                   Current Attention Level: Selective Memory: Decreased short-term memory   Safety/Judgement: Decreased awareness of safety;Decreased awareness of deficits Awareness: Emergent Problem Solving: Slow processing General Comments: Pt began bating with a dry washcloth. Cues to wet cloth and unscrew lid to put soap on cloth; unaware of being soiled        Exercises     Shoulder Instructions       General Comments      Pertinent Vitals/ Pain       Pain Assessment: Faces Faces Pain Scale: Hurts a little bit Pain Location: general discomfort Pain Descriptors / Indicators: Discomfort Pain Intervention(s): Limited activity within patient's tolerance  Home Living  Prior Functioning/Environment              Frequency  Min 3X/week        Progress Toward Goals  OT Goals(current goals can now be found in the care plan section)  Progress towards OT goals: Progressing toward goals  Acute Rehab OT Goals Patient Stated Goal: discharge home with his family tomorrow (wife and son are also here) OT Goal Formulation: With patient Time For Goal Achievement: 10/10/19 Potential to Achieve Goals: Good ADL Goals Pt Will Perform Grooming: with modified  independence;standing Pt Will Perform Lower Body Bathing: with modified independence;sit to/from stand Pt Will Perform Upper Body Dressing: with modified independence;sitting Pt Will Perform Lower Body Dressing: with modified independence;sit to/from stand Pt Will Transfer to Toilet: with modified independence;bedside commode;ambulating Pt Will Perform Toileting - Clothing Manipulation and hygiene: with modified independence;sit to/from stand;sitting/lateral leans Additional ADL Goal #1: Pt will perform simple IADL tasks with 2-3 cues  Plan Discharge plan remains appropriate    Co-evaluation                 AM-PAC OT "6 Clicks" Daily Activity     Outcome Measure   Help from another person eating meals?: None Help from another person taking care of personal grooming?: A Little Help from another person toileting, which includes using toliet, bedpan, or urinal?: A Little Help from another person bathing (including washing, rinsing, drying)?: A Little Help from another person to put on and taking off regular upper body clothing?: A Little Help from another person to put on and taking off regular lower body clothing?: A Little 6 Click Score: 19    End of Session Equipment Utilized During Treatment: Oxygen(2L)  OT Visit Diagnosis: Unsteadiness on feet (R26.81);Muscle weakness (generalized) (M62.81)   Activity Tolerance Patient tolerated treatment well   Patient Left in chair;with call bell/phone within reach   Nurse Communication Mobility status        Time: 1556-1630 OT Time Calculation (min): 34 min  Charges: OT General Charges $OT Visit: 1 Visit OT Treatments $Self Care/Home Management : 23-37 mins  Maurie Boettcher, OT/L   Acute OT Clinical Specialist [place] Pager [phone] Office [phone]    [facility] 10/08/2019, 4:55 PM

## 2019-10-08 NOTE — Progress Notes (Signed)
CSW spoke with patients APS, [place] [phone], regarding concerns for patient returning home. CSW informed Wells Guiles that PT is currently not recommending SNF and continue to recommend [facility] for patient- CSW dicussed private pay with son yesterday and he stated family was not able to afford that at this time. Wells Guiles stated if patient/ family was not able to private pay at this time then patient is able to return home- APS is able to continue to follow patient in the community and offer services as long as the patient allows them to help. Patient is alert and oriented x4 and able to make his own decisions regarding his care. CSW notified MD.   Kingsley Spittle, LCSW [facility] [place]  [phone]

## 2019-10-08 NOTE — Progress Notes (Signed)
APS social worker called for update on patient.  States will call Junie Panning, LCSW back later and speak with her.

## 2019-10-08 NOTE — Progress Notes (Signed)
PROGRESS NOTE                                                                                                                                                                                                             Patient Demographics:    Aaron Daniels, is a 78 y.o. male, DOB - 1941-05-08, ZOX:[id number]  Outpatient Primary MD for the patient is Albertha GheeSteen, James, MD   Admit date - 10/02/2019   LOS - 5  Chief Complaint  Patient presents with   Covid    09/21/2019       Brief Narrative: Patient is a 78 y.o. male with PMHx of COPD on chronic hypoxic respiratory failure on home O2, chronic diastolic heart failure, HTN, DM-2-who was admitted to [facility] for COVID-19 pneumonia from 10/11-10/20-presented to the hospital for weakness.  Patient lives with his son who is his primary caregiver-unfortunately has COVID-19 and is being admitted to the hospital.   Subjective:   Patient in bed, appears comfortable, denies any headache, no fever, no chest pain or pressure, no shortness of breath , no abdominal pain. No focal weakness.   Assessment  & Plan :   Covid 19 infection: Appropriately treated and no acute issues from this problem.   Lab Results  Component Value Date   SARSCOV2NAA POSITIVE (A) 09/21/2019   SARSCOV2NAA NEGATIVE 09/03/2019   SARSCOV2NAA NEGATIVE 07/03/2019     COVID-19 Medications:  Steroids: 10/12>> 10/22 Remdesivir: 1012>> 10/16    Weakness and deconditioning.  Unable to take care of himself at home, family members living with him are all acutely sick with COVID-19 infection.  PT OT eval.  May require placement to SNF for safe disposition.  Note he was discharged home but could not take care of himself and came right back.  Had detailed discussions with social worker on 10/07/2019.  UTI: Urine culture is inconclusive being treated empirically with 5 days of antibiotics total.  Stop date  10/08/2019.  COPD on chronic hypoxemic respiratory failure: Stable-continue bronchodilators  Chronic diastolic heart failure: Patient remains soft Lasix on hold has been adequately hydrated with IV fluids, currently appears compensated, has history of some chronic peripheral edema for which I will place TED stockings on 10/08/2019.  Suspicion for OSA: Stable for outpatient follow-up.  DM type II.  On ISS.  Outpatient control.  CBG (last 3)  Recent Labs    10/07/19 1526 10/07/19 2226 10/08/19 0804  GLUCAP 198* 134* 202*   Lab Results  Component Value Date   HGBA1C 6.5 (H) 09/04/2019     Condition - Stable  Family Communication  : None  Code Status : DNR  Diet :  Diet Order            Diet heart healthy/carb modified Room service appropriate? Yes; Fluid consistency: Thin  Diet effective now               Disposition Plan  :  Remain hospitalized, PT OT, may require SNF.  Barriers to discharge: Awaiting safe disposition plan by social work.  Consults  :  None  Procedures  :  None  Antibiotics  :    Anti-infectives (From admission, onward)   Start     Dose/Rate Route Frequency Ordered Stop   10/03/19 0200  cefTRIAXone (ROCEPHIN) 1 g in sodium chloride 0.9 % 100 mL IVPB     1 g 200 mL/hr over 30 Minutes Intravenous Daily at bedtime 10/03/19 0141       DVT Prophylaxis  :  Lovenox   Inpatient Medications  Scheduled Meds:  enoxaparin (LOVENOX) injection  40 mg Subcutaneous Daily   famotidine  20 mg Oral Daily   insulin aspart  0-5 Units Subcutaneous QHS   insulin aspart  0-9 Units Subcutaneous TID WC   mouth rinse  15 mL Mouth Rinse BID   tamsulosin  0.4 mg Oral QPC supper   umeclidinium-vilanterol  1 puff Inhalation Daily   Continuous Infusions:  cefTRIAXone (ROCEPHIN)  IV Stopped (10/08/19 0700)   PRN Meds:.acetaminophen **OR** [DISCONTINUED] acetaminophen, albuterol, ipratropium-albuterol   Time Spent in minutes  25  See all Orders from  today for further details   Susa Raring M.D on 10/08/2019 at 9:07 AM  To page go to [email or web address] - use universal password  [facility] -  Office  [phone]    Objective:   Vitals:   10/07/19 1200 10/07/19 1527 10/07/19 2000 10/08/19 0345  BP:  112/67 108/71 103/67  Pulse: (!) 101 100 96 90  Resp:   18 19  Temp:  98 F (36.7 C) 98.3 F (36.8 C) 99.3 F (37.4 C)  TempSrc:  Oral Oral Oral  SpO2: 90% 93% 92% (!) 89%  Weight:      Height:        Wt Readings from Last 3 Encounters:  10/03/19 96.1 kg  09/28/19 98.7 kg  09/15/19 104.3 kg     Intake/Output Summary (Last 24 hours) at 10/08/2019 0907 Last data filed at 10/08/2019 0400 Gross per 24 hour  Intake 2387.35 ml  Output 2850 ml  Net -462.65 ml     Physical Exam  Awake Alert,  No new F.N deficits, Normal affect [place].AT,PERRAL Supple Neck,No JVD, No cervical lymphadenopathy appriciated.  Symmetrical Chest wall movement, Good air movement bilaterally, CTAB RRR,No Gallops, Rubs or new Murmurs, No Parasternal Heave +ve B.Sounds, Abd Soft, No tenderness, No organomegaly appriciated, No rebound - guarding or rigidity. No Cyanosis, Clubbing or edema, No new Rash or bruise     Data Review:    CBC Recent Labs  Lab 10/02/19 2016 10/06/19 0305 10/07/19 0455 10/08/19 0003  WBC 10.9* 7.2 6.7 6.6  HGB 14.2 11.7* 11.7* 10.9*  HCT 44.5 37.0* 36.6* 34.0*  PLT 335 203 169 160  MCV 89.4 90.5 89.7 89.9  MCH 28.5 28.6 28.7 28.8  MCHC 31.9 31.6  32.0 32.1  RDW 16.0* 16.2* 16.2* 16.1*  LYMPHSABS 0.9 1.3 1.1 1.1  MONOABS 0.7 0.5 0.5 0.5  EOSABS 0.0 0.1 0.1 0.1  BASOSABS 0.0 0.0 0.0 0.0    Chemistries  Recent Labs  Lab 10/02/19 2016 10/04/19 0325 10/06/19 0305 10/07/19 0455 10/08/19 0003  NA 134* 135 133* 134* 133*  K 4.9 4.7 4.4 4.3 4.5  CL 97* 97* 96* 100 101  CO2 26 29 28 25 26   GLUCOSE 156* 143* 118* 128* 126*  BUN 38* 38* 38* 27* 23  CREATININE 1.16 1.00 1.00 0.91 0.88  CALCIUM 8.8*  8.7* 8.4* 8.6* 8.2*  MG  --   --  2.2 2.1 2.0  AST 22  --  15 15 14*  ALT 53*  --  25 25 24   ALKPHOS 68  --  49 51 48  BILITOT 0.6  --  0.8 0.7 0.6   ------------------------------------------------------------------------------------------------------------------ No results for input(s): CHOL, HDL, LDLCALC, TRIG, CHOLHDL, LDLDIRECT in the last 72 hours.  Lab Results  Component Value Date   HGBA1C 6.5 (H) 09/04/2019   ------------------------------------------------------------------------------------------------------------------ No results for input(s): TSH, T4TOTAL, T3FREE, THYROIDAB in the last 72 hours.  Invalid input(s): FREET3 ------------------------------------------------------------------------------------------------------------------ No results for input(s): VITAMINB12, FOLATE, FERRITIN, TIBC, IRON, RETICCTPCT in the last 72 hours.  Coagulation profile No results for input(s): INR, PROTIME in the last 168 hours.  No results for input(s): DDIMER in the last 72 hours.  Cardiac Enzymes No results for input(s): CKMB, TROPONINI, MYOGLOBIN in the last 168 hours.  Invalid input(s): CK ------------------------------------------------------------------------------------------------------------------    Component Value Date/Time   BNP 33.5 10/08/2019 0003    Micro Results Recent Results (from the past 240 hour(s))  Urine Culture     Status: Abnormal   Collection Time: 10/02/19  8:20 PM   Specimen: Urine, Random  Result Value Ref Range Status   Specimen Description URINE, RANDOM  Final   Special Requests   Final    NONE Performed at [facility], [address], [place], [address] [place]    Culture MULTIPLE SPECIES PRESENT, SUGGEST RECOLLECTION (A)  Final   Report Status 10/04/2019 FINAL  Final    Radiology Reports Ct Angio Chest Pe W/cm &/or Wo Cm  Result Date: 09/21/2019 CLINICAL DATA:  Shortness of breath, PE suspected, high pretest probability EXAM:  CT ANGIOGRAPHY CHEST WITH CONTRAST TECHNIQUE: Multidetector CT imaging of the chest was performed using the standard protocol during bolus administration of intravenous contrast. Multiplanar CT image reconstructions and MIPs were obtained to evaluate the vascular anatomy. CONTRAST:  10mL OMNIPAQUE IOHEXOL 350 MG/ML SOLN COMPARISON:  Most recent CTA chest 09/05/2019 FINDINGS: Cardiovascular: Suboptimal opacification of the pulmonary arteries. Contrast opacifies to the level of the proximal segmental arteries with more distal evaluation limited by bolus and respiratory motion. No central, lobar or proximal segmental pulmonary arterial filling defects are seen. Central pulmonary arteries are normal caliber. Atherosclerotic plaque within the normal caliber aorta. Shared origin of the brachiocephalic and left common carotid. Atheromatous plaque within the proximal great vessels. Normal heart size. Small volume pericardial fluid is likely within physiologic normal. Aortic leaflet calcifications are present. Coronary artery calcifications are present as well. Mediastinum/Nodes: Multiple reactive appearing though nonenlarged lymph nodes are present in the mediastinum and bilateral hila. Thyroid gland and thoracic inlet are unremarkable. Bowing of the posterior trachea reflects imaging during exhalation. No acute tracheal abnormality. The esophagus is unremarkable. Lungs/Pleura: There are patchy multifocal areas of mixed reticular interstitial and ground-glass opacity most  pronounced in the periphery of the posterior segments of both upper lobes, within the lingula and right lung base. Small bilateral pleural effusions are present as well. No pneumothorax. Upper Abdomen: Nodular thickening of the adrenal glands is similar to comparison studies No acute abnormalities present in the visualized portions of the upper abdomen. Musculoskeletal: Multilevel degenerative changes are present in the imaged portions of the spine. More  severe changes are noted in the lower cervical spine, incompletely evaluated on this CT exam. Mild left and severe right osteoarthrosis of the shoulders. Review of the MIP images confirms the above findings. IMPRESSION: 1. Suboptimal opacification of the pulmonary arteries but without visible central, lobar or proximal segmental pulmonary arterial filling defects. 2. Patchy multifocal areas of mixed reticular interstitial and ground-glass opacity most pronounced in the periphery of the posterior segments of both upper lobes, within the lingula and right lung base, concerning for a possible acute infectious or inflammatory process. Reactive adenopathy present in the mediastinum and hila. 3. Small bilateral pleural effusions. 4. Nodular thickening of the adrenal glands likely reflecting senescent adrenal hyperplasia. 5. Aortic Atherosclerosis (ICD10-I70.0). Electronically Signed   By: Lovena Le M.D.   On: 09/21/2019 22:32   Dg Chest Portable 1 View  Result Date: 09/21/2019 CLINICAL DATA:  Shortness of breath. EXAM: PORTABLE CHEST 1 VIEW COMPARISON:  September 06, 2019. FINDINGS: The heart size and mediastinal contours are within normal limits. No pneumothorax or pleural effusion is noted. Increased bibasilar atelectasis or edema is noted. No pneumothorax or pleural effusion is noted. The visualized skeletal structures are unremarkable. IMPRESSION: Increased bibasilar atelectasis or edema is noted. Electronically Signed   By: Marijo Conception M.D.   On: 09/21/2019 16:48

## 2019-10-09 ENCOUNTER — Ambulatory Visit: Payer: Medicare Other

## 2019-10-09 DIAGNOSIS — U071 COVID-19: Secondary | ICD-10-CM | POA: Diagnosis not present

## 2019-10-09 LAB — COMPREHENSIVE METABOLIC PANEL
ALT: 28 U/L (ref 0–44)
AST: 16 U/L (ref 15–41)
Albumin: 2.3 g/dL — ABNORMAL LOW (ref 3.5–5.0)
Alkaline Phosphatase: 50 U/L (ref 38–126)
Anion gap: 7 (ref 5–15)
BUN: 23 mg/dL (ref 8–23)
CO2: 25 mmol/L (ref 22–32)
Calcium: 8.3 mg/dL — ABNORMAL LOW (ref 8.9–10.3)
Chloride: 100 mmol/L (ref 98–111)
Creatinine, Ser: 0.91 mg/dL (ref 0.61–1.24)
GFR calc Af Amer: >60 mL/min (ref >60)
GFR calc non Af Amer: >60 mL/min (ref >60)
Glucose, Bld: 133 mg/dL — ABNORMAL HIGH (ref 70–99)
Potassium: 4.2 mmol/L (ref 3.5–5.1)
Sodium: 132 mmol/L — ABNORMAL LOW (ref 135–145)
Total Bilirubin: 0.4 mg/dL (ref 0.3–1.2)
Total Protein: 5.4 g/dL — ABNORMAL LOW (ref 6.5–8.1)

## 2019-10-09 LAB — GLUCOSE, CAPILLARY
Glucose-Capillary: 150 mg/dL — ABNORMAL HIGH (ref 70–99)
Glucose-Capillary: 116 mg/dL — ABNORMAL HIGH (ref 70–99)
Glucose-Capillary: 243 mg/dL — ABNORMAL HIGH (ref 70–99)
Glucose-Capillary: 132 mg/dL — ABNORMAL HIGH (ref 70–99)
Glucose-Capillary: 149 mg/dL — ABNORMAL HIGH (ref 70–99)

## 2019-10-09 LAB — CBC WITH DIFFERENTIAL/PLATELET
Abs Immature Granulocytes: 0.07 10*3/uL (ref 0.00–0.07)
Basophils Absolute: 0 10*3/uL (ref 0.0–0.1)
Basophils Relative: 0 %
Eosinophils Absolute: 0.1 10*3/uL (ref 0.0–0.5)
Eosinophils Relative: 2 %
HCT: 34.9 % — ABNORMAL LOW (ref 39.0–52.0)
Hemoglobin: 11 g/dL — ABNORMAL LOW (ref 13.0–17.0)
Immature Granulocytes: 1 %
Lymphocytes Relative: 18 %
Lymphs Abs: 1.1 10*3/uL (ref 0.7–4.0)
MCH: 28.9 pg (ref 26.0–34.0)
MCHC: 31.5 g/dL (ref 30.0–36.0)
MCV: 91.6 fL (ref 80.0–100.0)
Monocytes Absolute: 0.5 10*3/uL (ref 0.1–1.0)
Monocytes Relative: 8 %
Neutro Abs: 4.3 10*3/uL (ref 1.7–7.7)
Neutrophils Relative %: 71 %
Platelets: 159 10*3/uL (ref 150–400)
RBC: 3.81 MIL/uL — ABNORMAL LOW (ref 4.22–5.81)
RDW: 16.1 % — ABNORMAL HIGH (ref 11.5–15.5)
WBC: 6 10*3/uL (ref 4.0–10.5)
nRBC: 0 % (ref 0.0–0.2)

## 2019-10-09 LAB — PROCALCITONIN: Procalcitonin: <0.1 ng/mL

## 2019-10-09 LAB — MAGNESIUM: Magnesium: 2 mg/dL (ref 1.7–2.4)

## 2019-10-09 LAB — D-DIMER, QUANTITATIVE: D-Dimer, Quant: 1.3 ug/mL-FEU — ABNORMAL HIGH (ref 0.00–0.50)

## 2019-10-09 LAB — C-REACTIVE PROTEIN: CRP: 9.6 mg/dL — ABNORMAL HIGH (ref <1.0)
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
## 2019-10-09 NOTE — Progress Notes (Signed)
PROGRESS NOTE                                                                                                                                                                                                             Patient Demographics:    Spyros Winch, is a 78 y.o. male, DOB - [birth year], ZOX:[id number]  Outpatient Primary MD for the patient is Albertha Ghee, MD   Admit date - 10/02/2019   LOS - 6  Chief Complaint  Patient presents with   Covid    09/21/2019       Brief Narrative: Patient is a 78 y.o. male with PMHx of COPD on chronic hypoxic respiratory failure on home O2, chronic diastolic heart failure, HTN, DM-2-who was admitted to [facility] for COVID-19 pneumonia from 10/11-10/20-presented to the hospital for weakness.  Patient lives with his son who is his primary caregiver-unfortunately has COVID-19 and is being admitted to the hospital.   Subjective:   Patient in bed, appears comfortable, denies any headache, no fever, no chest pain or pressure, no shortness of breath , no abdominal pain. No focal weakness.   Assessment  & Plan :   Covid 19 infection: Appropriately treated and no acute issues from this problem.   Lab Results  Component Value Date   SARSCOV2NAA POSITIVE (A) 09/21/2019   SARSCOV2NAA NEGATIVE 09/03/2019   SARSCOV2NAA NEGATIVE 07/03/2019     COVID-19 Medications:  Steroids: 10/12>> 10/22 Remdesivir: 1012>> 10/16    Fever 10/08/2019.  Appears to have aspirated, encouraged to sit up in chair for all meals, nursing staff also informed, monitor closely, procalcitonin stable, blood cultures negative.  If he spikes another temperature will add Unasyn.  For now clinically appears stable.  Speech to evaluate as well.   Weakness and deconditioning.  Unable to take care of himself at home, family members living with him are all acutely sick with COVID-19 infection.  PT OT eval.  May  require placement to SNF for safe disposition.  Note he was discharged home but could not take care of himself and came right back.  Had detailed discussions with social worker on 10/07/2019.  UTI: Urine culture is inconclusive being treated empirically with 5 days of antibiotics total.  Stop date 10/08/2019.  COPD on chronic hypoxemic respiratory failure: Stable-continue bronchodilators  Chronic diastolic heart failure: Patient  remains soft Lasix on hold has been adequately hydrated with IV fluids, currently appears compensated, has history of some chronic peripheral edema for which I will place TED stockings on 10/08/2019.  Suspicion for OSA: Stable for outpatient follow-up.  DM type II.  On ISS.  Outpatient control.   CBG (last 3)  Recent Labs    10/08/19 1115 10/08/19 1945 10/09/19 0812  GLUCAP 215* 164* 116*   Lab Results  Component Value Date   HGBA1C 6.5 (H) 09/04/2019     Condition - Stable  Family Communication  : None  Code Status : DNR  Diet :  Diet Order            Diet heart healthy/carb modified Room service appropriate? Yes; Fluid consistency: Thin  Diet effective now               Disposition Plan  : Discharge home per social work, likely discharge on 10/10/2019 if no more fevers.  Barriers to discharge: Awaiting safe disposition plan by social work.  Consults  :  None  Procedures  :  None  Antibiotics  :    Anti-infectives (From admission, onward)   Start     Dose/Rate Route Frequency Ordered Stop   10/03/19 0200  cefTRIAXone (ROCEPHIN) 1 g in sodium chloride 0.9 % 100 mL IVPB     1 g 200 mL/hr over 30 Minutes Intravenous Daily at bedtime 10/03/19 0141       DVT Prophylaxis  :  Lovenox   Inpatient Medications  Scheduled Meds:  enoxaparin (LOVENOX) injection  40 mg Subcutaneous Daily   famotidine  20 mg Oral Daily   insulin aspart  0-5 Units Subcutaneous QHS   insulin aspart  0-9 Units Subcutaneous TID WC   mouth rinse  15 mL  Mouth Rinse BID   tamsulosin  0.4 mg Oral QPC supper   umeclidinium-vilanterol  1 puff Inhalation Daily   Continuous Infusions:  cefTRIAXone (ROCEPHIN)  IV Stopped (10/09/19 0801)   PRN Meds:.acetaminophen **OR** [DISCONTINUED] acetaminophen, albuterol, ipratropium-albuterol   Time Spent in minutes  25  See all Orders from today for further details   Susa Raring M.D on 10/09/2019 at 9:24 AM  To page go to [email or web address] - use universal password  [facility] -  Office  [phone]    Objective:   Vitals:   10/08/19 1529 10/08/19 2120 10/08/19 2200 10/09/19 0444  BP: 111/75 93/70  101/65  Pulse: 95 (!) 102 92 99  Resp: Temp: (!) 100.8 F (38.2 C) 99.3 F (37.4 C)  98.3 F (36.8 C)  TempSrc: Axillary Oral  Oral  SpO2: 95% 91% 94% 91%  Weight:      Height:        Wt Readings from Last 3 Encounters:  10/03/19 96.1 kg  09/28/19 98.7 kg  09/15/19 104.3 kg     Intake/Output Summary (Last 24 hours) at 10/09/2019 0924 Last data filed at 10/09/2019 0600 Gross per 24 hour  Intake 460.06 ml  Output 2700 ml  Net -2239.94 ml     Physical Exam  Awake Alert,  No new F.N deficits, Normal affect .AT,PERRAL Supple Neck,No JVD, No cervical lymphadenopathy appriciated.  Symmetrical Chest wall movement, Good air movement bilaterally, few RLL crackles RRR,No Gallops, Rubs or new Murmurs, No Parasternal Heave +ve B.Sounds, Abd Soft, No tenderness, No organomegaly appriciated, No rebound - guarding or rigidity. No Cyanosis, Clubbing or edema, No new Rash or bruise    Data  Review:    CBC Recent Labs  Lab 10/02/19 2016 10/06/19 0305 10/07/19 0455 10/08/19 0003 10/09/19 0415  WBC 10.9* 7.2 6.7 6.6 6.0  HGB 14.2 11.7* 11.7* 10.9* 11.0*  HCT 44.5 37.0* 36.6* 34.0* 34.9*  PLT 335 203 169 160 159  MCV 89.4 90.5 89.7 89.9 91.6  MCH 28.5 28.6 28.7 28.8 28.9  MCHC 31.9 31.6 32.0 32.1 31.5  RDW 16.0* 16.2* 16.2* 16.1* 16.1*  LYMPHSABS 0.9 1.3  1.1 1.1 1.1  MONOABS 0.7 0.5 0.5 0.5 0.5  EOSABS 0.0 0.1 0.1 0.1 0.1  BASOSABS 0.0 0.0 0.0 0.0 0.0    Chemistries  Recent Labs  Lab 10/02/19 2016 10/04/19 0325 10/06/19 0305 10/07/19 0455 10/08/19 0003 10/09/19 0415  NA 134* 135 133* 134* 133* 132*  K 4.9 4.7 4.4 4.3 4.5 4.2  CL 97* 97* 96* 100 101 100  CO2 26 29 28 25 26 25   GLUCOSE 156* 143* 118* 128* 126* 133*  BUN 38* 38* 38* 27* 23 23  CREATININE 1.16 1.00 1.00 0.91 0.88 0.91  CALCIUM 8.8* 8.7* 8.4* 8.6* 8.2* 8.3*  MG  --   --  2.2 2.1 2.0 2.0  AST 22  --  15 15 14* 16  ALT 53*  --  25 25 24 28   ALKPHOS 68  --  49 51 48 50  BILITOT 0.6  --  0.8 0.7 0.6 0.4   ------------------------------------------------------------------------------------------------------------------ No results for input(s): CHOL, HDL, LDLCALC, TRIG, CHOLHDL, LDLDIRECT in the last 72 hours.  Lab Results  Component Value Date   HGBA1C 6.5 (H) 09/04/2019   ------------------------------------------------------------------------------------------------------------------ No results for input(s): TSH, T4TOTAL, T3FREE, THYROIDAB in the last 72 hours.  Invalid input(s): FREET3 ------------------------------------------------------------------------------------------------------------------ No results for input(s): VITAMINB12, FOLATE, FERRITIN, TIBC, IRON, RETICCTPCT in the last 72 hours.  Coagulation profile No results for input(s): INR, PROTIME in the last 168 hours.  Recent Labs    10/09/19 0415  DDIMER 1.30*    Cardiac Enzymes No results for input(s): CKMB, TROPONINI, MYOGLOBIN in the last 168 hours.  Invalid input(s): CK ------------------------------------------------------------------------------------------------------------------    Component Value Date/Time   BNP 33.5 10/08/2019 0003    Micro Results Recent Results (from the past 240 hour(s))  Urine Culture     Status: Abnormal   Collection Time: 10/02/19  8:20 PM    Specimen: Urine, Random  Result Value Ref Range Status   Specimen Description URINE, RANDOM  Final   Special Requests   Final    NONE Performed at [facility], [address] [place], [place] [address]    Culture MULTIPLE SPECIES PRESENT, SUGGEST RECOLLECTION (A)  Final   Report Status 10/04/2019 FINAL  Final  Culture, blood (routine x 2)     Status: None (Preliminary result)   Collection Time: 10/08/19  4:35 PM   Specimen: BLOOD  Result Value Ref Range Status   Specimen Description   Final    BLOOD LEFT ARM Performed at [facility], [address] [place], [place] [address]    Special Requests   Final    BOTTLES DRAWN AEROBIC ONLY Blood Culture adequate volume Performed at [facility], [address] [place], [place] [address]    Culture   Final    NO GROWTH < 12 HOURS Performed at [facility], [address] [place], [place] [address]    Report Status PENDING  Incomplete  Culture, blood (routine x 2)     Status: None (Preliminary result)  Collection Time: 10/08/19  4:40 PM   Specimen: BLOOD LEFT HAND  Result Value Ref Range Status   Specimen Description   Final    BLOOD LEFT HAND Performed at [place] [address], [place], [place] [address]    Special Requests   Final    BOTTLES DRAWN AEROBIC ONLY Blood Culture adequate volume Performed at [place] [address] [place], [place] [address]    Culture   Final    NO GROWTH < 12 HOURS Performed at [place] [address], [place], [place] [address]    Report Status PENDING  Incomplete    Radiology Reports Ct Angio Chest Pe W/cm &/or Wo Cm  Result Date: 09/21/2019 CLINICAL DATA:  Shortness of breath, PE suspected, high pretest probability EXAM: CT ANGIOGRAPHY CHEST WITH CONTRAST TECHNIQUE: Multidetector CT imaging of the chest was performed using the standard protocol during bolus administration of  intravenous contrast. Multiplanar CT image reconstructions and MIPs were obtained to evaluate the vascular anatomy. CONTRAST:  12mL OMNIPAQUE IOHEXOL 350 MG/ML SOLN COMPARISON:  Most recent CTA chest 09/05/2019 FINDINGS: Cardiovascular: Suboptimal opacification of the pulmonary arteries. Contrast opacifies to the level of the proximal segmental arteries with more distal evaluation limited by bolus and respiratory motion. No central, lobar or proximal segmental pulmonary arterial filling defects are seen. Central pulmonary arteries are normal caliber. Atherosclerotic plaque within the normal caliber aorta. Shared origin of the brachiocephalic and left common carotid. Atheromatous plaque within the proximal great vessels. Normal heart size. Small volume pericardial fluid is likely within physiologic normal. Aortic leaflet calcifications are present. Coronary artery calcifications are present as well. Mediastinum/Nodes: Multiple reactive appearing though nonenlarged lymph nodes are present in the mediastinum and bilateral hila. Thyroid gland and thoracic inlet are unremarkable. Bowing of the posterior trachea reflects imaging during exhalation. No acute tracheal abnormality. The esophagus is unremarkable. Lungs/Pleura: There are patchy multifocal areas of mixed reticular interstitial and ground-glass opacity most pronounced in the periphery of the posterior segments of both upper lobes, within the lingula and right lung base. Small bilateral pleural effusions are present as well. No pneumothorax. Upper Abdomen: Nodular thickening of the adrenal glands is similar to comparison studies No acute abnormalities present in the visualized portions of the upper abdomen. Musculoskeletal: Multilevel degenerative changes are present in the imaged portions of the spine. More severe changes are noted in the lower cervical spine, incompletely evaluated on this CT exam. Mild left and severe right osteoarthrosis of the shoulders.  Review of the MIP images confirms the above findings. IMPRESSION: 1. Suboptimal opacification of the pulmonary arteries but without visible central, lobar or proximal segmental pulmonary arterial filling defects. 2. Patchy multifocal areas of mixed reticular interstitial and ground-glass opacity most pronounced in the periphery of the posterior segments of both upper lobes, within the lingula and right lung base, concerning for a possible acute infectious or inflammatory process. Reactive adenopathy present in the mediastinum and hila. 3. Small bilateral pleural effusions. 4. Nodular thickening of the adrenal glands likely reflecting senescent adrenal hyperplasia. 5. Aortic Atherosclerosis (ICD10-I70.0). Electronically Signed   By: Lovena Le M.D.   On: 09/21/2019 22:32   Dg Chest Port 1 View  Result Date: 10/08/2019 CLINICAL DATA:  Shortness of breath.  COVID-19 positive. EXAM: PORTABLE CHEST 1 VIEW COMPARISON:  09/21/2019 FINDINGS: Midline trachea. Normal heart size. No pleural effusion or pneumothorax. Bibasilar pulmonary opacities are minimally improved. There is subtle lateral right midlung airspace disease which  is new or more conspicuous since the prior radiograph. IMPRESSION: Shifting pulmonary opacities with improved bibasilar aeration and new lateral right upper lobe mild airspace disease. Findings suggest overall improvement in infection. Electronically Signed   By: Jeronimo Greaves M.D.   On: 10/08/2019 16:38   Dg Chest Portable 1 View  Result Date: 09/21/2019 CLINICAL DATA:  Shortness of breath. EXAM: PORTABLE CHEST 1 VIEW COMPARISON:  September 06, 2019. FINDINGS: The heart size and mediastinal contours are within normal limits. No pneumothorax or pleural effusion is noted. Increased bibasilar atelectasis or edema is noted. No pneumothorax or pleural effusion is noted. The visualized skeletal structures are unremarkable. IMPRESSION: Increased bibasilar atelectasis or edema is noted.  Electronically Signed   By: Lupita Raider M.D.   On: 09/21/2019 16:48

## 2019-10-09 NOTE — Evaluation (Signed)
Clinical/Bedside Swallow Evaluation Patient Details  Name: Aaron Daniels MRN: [id number] Date of Birth: 1941-06-02  Today's Date: 10/09/2019 Time: SLP Start Time (ACUTE ONLY): 1013 SLP Stop Time (ACUTE ONLY): 1032 SLP Time Calculation (min) (ACUTE ONLY): 19 min  Past Medical History:  Past Medical History:  Diagnosis Date  . COPD (chronic obstructive pulmonary disease) ([place])   . Hypertension    Past Surgical History: History reviewed. No pertinent surgical history. HPI:  78 y.o. male with PMHx of COPD on chronic hypoxic respiratory failure on home O2, chronic diastolic heart failure, HTN, DM-2-who was admitted to [facility] for COVID-19 pneumonia from 10/11-10/20, readmitted 10/23 with worsening SOB, weakness and ongoing COVID+ dx. Notes indicate concern for possible incident of aspiration 10/28, hence SLP swallow eval ordered.     Assessment / Plan / Recommendation Clinical Impression  Pt presents with normal oropharyngeal swallow.  Oral mechanism exam is unremarkable.  He demonstrates active, effective mastication of regular solids, swift swallow response, and no s/s of aspiration with consecutive thin liquid boluses.  Respiratory/swallow coordination appears adequate.  Educated pt about the impact of CHF, RR on swallowing safety, emphasizing taking frequent rest breaks.  Recommend continuing regular diet, thin liquids.  No SLP f/u is needed.  SLP Visit Diagnosis: Dysphagia, unspecified (R13.10)    Aspiration Risk  No limitations    Diet Recommendation   regular diet, thin liquids  Medication Administration: Whole meds with liquid    Other  Recommendations Oral Care Recommendations: Oral care BID   Follow up Recommendations None      Frequency and Duration            Prognosis        Swallow Study   General HPI: 78 y.o. male with PMHx of COPD on chronic hypoxic respiratory failure on home O2, chronic diastolic heart failure, HTN, DM-2-who was admitted to [facility] for COVID-19 pneumonia from 10/11-10/20, readmitted 10/23 with worsening SOB, weakness and ongoing COVID+ dx. Notes indicate concern for possible incident of aspiration 10/28, hence SLP swallow eval ordered.   Type of Study: Bedside Swallow Evaluation Previous Swallow Assessment: no Diet Prior to this Study: Regular;Thin liquids Temperature Spikes Noted: No Respiratory Status: Nasal cannula(one liter) History of Recent Intubation: No Behavior/Cognition: Alert;Cooperative;Pleasant mood Oral Cavity Assessment: Within Functional Limits Oral Care Completed by SLP: No Oral Cavity - Dentition: Missing dentition Vision: Functional for self-feeding Self-Feeding Abilities: Able to feed self Patient Positioning: Upright in chair Baseline Vocal Quality: Normal Volitional Cough: Strong Volitional Swallow: Able to elicit    Oral/Motor/Sensory Function Overall Oral Motor/Sensory Function: Within functional limits   Ice Chips Ice chips: Within functional limits   Thin Liquid Thin Liquid: Within functional limits    Nectar Thick Nectar Thick Liquid: Not tested   Honey Thick Honey Thick Liquid: Not tested   Puree Puree: Within functional limits   Solid     Solid: Within functional limits      Juan Quam Laurice 10/09/2019,10:35 AM   Estill Bamberg L. Tivis Ringer, [place] Office number [phone] Pager [phone]

## 2019-10-09 NOTE — Progress Notes (Signed)
Physical Therapy Treatment Patient Details Name: Aaron Daniels MRN: [id number] DOB: [birth year] Today's Date: 10/09/2019    History of Present Illness 78 y/o male recently d/c home on 09/28 then 10/20  after hospitalization w/ hypoxic respiratory failure. returned to ED w/ worsening SOB, admitted with acute CHF exacerbation then found to be COVID +PMHx: COPD on 3L/min at home, HTN, BPH s/p TURP, CHF    PT Comments    Pt progressing with mobility, he is doing much better this session. He seems confused when asked about 02 use and incentive spirometer use but he is excited to be going home to family. Pt was able to ambulate approx 295ft with RW and SBA, on 1L/min via [place] and maintain sats in 90s. He still needs some cues for safety and walker use, also to complete exercises.    Follow Up Recommendations  Home health PT     Equipment Recommendations       Recommendations for Other Services       Precautions / Restrictions Precautions Precautions: Fall;Other (comment) Precaution Comments: monitor sats and vitals w/ activity, also cognition Restrictions Weight Bearing Restrictions: No    Mobility  Bed Mobility               General bed mobility comments: pt received sitting in recliner  Transfers Overall transfer level: Needs assistance Equipment used: Rolling walker (2 wheeled) Transfers: Sit to/from Stand Sit to Stand: Supervision         General transfer comment: cues needed for safety and some assist with line management  Ambulation/Gait Ambulation/Gait assistance: Supervision Gait Distance (Feet): 250 Feet Assistive device: Rolling walker (2 wheeled) Gait Pattern/deviations: Step-through pattern     General Gait Details: slow, on 1L/min and able to maintain sats >90 throughout.   Stairs             Wheelchair Mobility    Modified Rankin (Stroke Patients Only)       Balance Overall balance assessment: Needs assistance Sitting-balance support:  Feet unsupported Sitting balance-Leahy Scale: Good     Standing balance support: During functional activity;Bilateral upper extremity supported Standing balance-Leahy Scale: Fair Standing balance comment: much better this session no extrenal support needed and no LObs noted                            Cognition Arousal/Alertness: Awake/alert Behavior During Therapy: WFL for tasks assessed/performed Overall Cognitive Status: No family/caregiver present to determine baseline cognitive functioning Area of Impairment: Memory;Safety/judgement;Awareness;Problem solving                   Current Attention Level: Selective Memory: Decreased short-term memory   Safety/Judgement: Decreased awareness of safety;Decreased awareness of deficits   Problem Solving: Slow processing General Comments: pt still very slow to process, seems to be able to talk about certain concepts (gardening w/o diffiuclty) but when asked about IS or condition and dc home has problem solving and slow processing.      Exercises Other Exercises Other Exercises: incentive spirometer x 10  w/ vcs    General Comments General comments (skin integrity, edema, etc.): Pt functionally doing much better this session. he seems confused when asked about 02 use and incentive spirometer but he is excited to be going home to family. Pt was able to ambulate approx 2101ft with RW and SBA, on 1L/min via [place] and maintain sats in 90s.      Pertinent Vitals/Pain Pain Assessment: No/denies pain  Home Living                      Prior Function            PT Goals (current goals can now be found in the care plan section) Acute Rehab PT Goals Patient Stated Goal: states to go home today PT Goal Formulation: With patient Time For Goal Achievement: 10/24/19 Potential to Achieve Goals: Good Progress towards PT goals: Progressing toward goals    Frequency    Min 3X/week      PT Plan Current plan  remains appropriate    Co-evaluation              AM-PAC PT "6 Clicks" Mobility   Outcome Measure  Help needed turning from your back to your side while in a flat bed without using bedrails?: A Little Help needed moving from lying on your back to sitting on the side of a flat bed without using bedrails?: A Little Help needed moving to and from a bed to a chair (including a wheelchair)?: None Help needed standing up from a chair using your arms (e.g., wheelchair or bedside chair)?: A Little Help needed to walk in hospital room?: A Little Help needed climbing 3-5 steps with a railing? : A Little 6 Click Score: 19    End of Session Equipment Utilized During Treatment: Oxygen Activity Tolerance: Patient tolerated treatment well Patient left: in chair;with call bell/phone within reach Nurse Communication: Mobility status PT Visit Diagnosis: Unsteadiness on feet (R26.81);Other abnormalities of gait and mobility (R26.89);Muscle weakness (generalized) (M62.81)     Time: 5681-2751 PT Time Calculation (min) (ACUTE ONLY): 23 min  Charges:  $Gait Training: 8-22 mins $Therapeutic Exercise: 8-22 mins                     Drema Pry, PT    Freddi Starr 10/09/2019, 4:21 PM

## 2019-10-10 DIAGNOSIS — U071 COVID-19: Secondary | ICD-10-CM | POA: Diagnosis not present

## 2019-10-10 LAB — CBC WITH DIFFERENTIAL/PLATELET
Abs Immature Granulocytes: 0.05 10*3/uL (ref 0.00–0.07)
Basophils Absolute: 0 10*3/uL (ref 0.0–0.1)
Basophils Relative: 0 %
Eosinophils Absolute: 0.2 10*3/uL (ref 0.0–0.5)
Eosinophils Relative: 3 %
HCT: 34.6 % — ABNORMAL LOW (ref 39.0–52.0)
Hemoglobin: 10.8 g/dL — ABNORMAL LOW (ref 13.0–17.0)
Immature Granulocytes: 1 %
Lymphocytes Relative: 20 %
Lymphs Abs: 1 10*3/uL (ref 0.7–4.0)
MCH: 28.6 pg (ref 26.0–34.0)
MCHC: 31.2 g/dL (ref 30.0–36.0)
MCV: 91.8 fL (ref 80.0–100.0)
Monocytes Absolute: 0.4 10*3/uL (ref 0.1–1.0)
Monocytes Relative: 8 %
Neutro Abs: 3.3 10*3/uL (ref 1.7–7.7)
Neutrophils Relative %: 68 %
Platelets: 161 10*3/uL (ref 150–400)
RBC: 3.77 MIL/uL — ABNORMAL LOW (ref 4.22–5.81)
RDW: 16 % — ABNORMAL HIGH (ref 11.5–15.5)
WBC: 4.9 10*3/uL (ref 4.0–10.5)
nRBC: 0 % (ref 0.0–0.2)

## 2019-10-10 LAB — COMPREHENSIVE METABOLIC PANEL
ALT: 32 U/L (ref 0–44)
AST: 21 U/L (ref 15–41)
Albumin: 2.3 g/dL — ABNORMAL LOW (ref 3.5–5.0)
Alkaline Phosphatase: 50 U/L (ref 38–126)
Anion gap: 8 (ref 5–15)
BUN: 25 mg/dL — ABNORMAL HIGH (ref 8–23)
CO2: 28 mmol/L (ref 22–32)
Calcium: 8.7 mg/dL — ABNORMAL LOW (ref 8.9–10.3)
Chloride: 100 mmol/L (ref 98–111)
Creatinine, Ser: 1.06 mg/dL (ref 0.61–1.24)
GFR calc Af Amer: >60 mL/min (ref >60)
GFR calc non Af Amer: >60 mL/min (ref >60)
Glucose, Bld: 140 mg/dL — ABNORMAL HIGH (ref 70–99)
Potassium: 4.4 mmol/L (ref 3.5–5.1)
Sodium: 136 mmol/L (ref 135–145)
Total Bilirubin: 0.3 mg/dL (ref 0.3–1.2)
Total Protein: 5.5 g/dL — ABNORMAL LOW (ref 6.5–8.1)

## 2019-10-10 LAB — PROCALCITONIN: Procalcitonin: <0.1 ng/mL

## 2019-10-10 LAB — MAGNESIUM: Magnesium: 2.2 mg/dL (ref 1.7–2.4)

## 2019-10-10 LAB — C-REACTIVE PROTEIN: CRP: 9.3 mg/dL — ABNORMAL HIGH (ref <1.0)

## 2019-10-10 LAB — D-DIMER, QUANTITATIVE: D-Dimer, Quant: 1.24 ug/mL-FEU — ABNORMAL HIGH (ref 0.00–0.50)

## 2019-10-10 LAB — GLUCOSE, CAPILLARY
Glucose-Capillary: 186 mg/dL — ABNORMAL HIGH (ref 70–99)
Glucose-Capillary: 131 mg/dL — ABNORMAL HIGH (ref 70–99)
Glucose-Capillary: 153 mg/dL — ABNORMAL HIGH (ref 70–99)

## 2019-10-10 MED ORDER — MIDODRINE HCL 5 MG PO TABS
5.0000 mg | ORAL_TABLET | Freq: Three times a day (TID) | ORAL | Status: DC
  Administered 2019-10-10 (×2): 5 mg via ORAL
  Filled 2019-10-10 (×2): qty 1

## 2019-10-10 MED ORDER — FUROSEMIDE 20 MG PO TABS: 20.0000 mg | ORAL_TABLET | Freq: Every day | ORAL | 0 refills | Status: DC

## 2019-10-10 MED ORDER — METRONIDAZOLE IN NACL 5-0.79 MG/ML-% IV SOLN
500.0000 mg | Freq: Three times a day (TID) | INTRAVENOUS | Status: DC
  Administered 2019-10-10: 500 mg via INTRAVENOUS
  Filled 2019-10-10 (×3): qty 100

## 2019-10-10 MED ORDER — SODIUM CHLORIDE 0.9 % IV SOLN: 1.0000 g | INTRAVENOUS | Status: DC

## 2019-10-10 MED ORDER — METRONIDAZOLE 500 MG PO TABS: 500.0000 mg | ORAL_TABLET | Freq: Three times a day (TID) | ORAL | 0 refills | Status: AC

## 2019-10-10 MED ORDER — MIDODRINE HCL 5 MG PO TABS: 5.0000 mg | ORAL_TABLET | Freq: Three times a day (TID) | ORAL | 0 refills | Status: DC

## 2019-10-10 MED ORDER — DIPHENHYDRAMINE HCL 50 MG/ML IJ SOLN: 25.0000 mg | Freq: Four times a day (QID) | INTRAMUSCULAR | Status: DC | PRN

## 2019-10-10 MED ORDER — CEPHALEXIN 500 MG PO CAPS: 500.0000 mg | ORAL_CAPSULE | Freq: Three times a day (TID) | ORAL | 0 refills | Status: AC

## 2019-10-10 NOTE — Discharge Summary (Signed)
Aaron PiesWilliam Daniels XBJ:[id number] DOB: 1941-06-06 DOA: 10/02/2019  PCP: Albertha GheeSteen, James, MD  Admit date: 10/02/2019  Discharge date: 10/10/2019  Admitted From: Home   Disposition:  Home (safe per social work)   Recommendations for Outpatient Follow-up:   Follow up with PCP in 1-2 weeks  PCP Please obtain BMP/CBC, 2 view CXR in 1week,  (see Discharge instructions)   PCP Please follow up on the following pending results: None   Home Health: PT,RN, S Work   Equipment/Devices: None  Consultations: None  Discharge Condition: Stable    CODE STATUS: Full    Diet Recommendation: Heart Healthy Low Carb   Chief Complaint  Patient presents with   Covid    09/21/2019     Brief history of present illness from the day of admission and additional interim summary     Patient is a 78 y.o. male with PMHx of COPD on chronic hypoxic respiratory failure on home O2, chronic diastolic heart failure, HTN, DM-2-who was admitted to [facility] for COVID-19 pneumonia from 10/11-10/20-presented to the hospital for weakness.  Patient lives with his son who is his primary caregiver-unfortunately has COVID-19 and is being admitted to the hospital.                                                                 Hospital Course   Covid 19 infection: Appropriately treated and no acute issues from this problem.  Fever 10/08/2019.  Appears to have aspirated, encouraged to sit up in chair for all meals, but patient remains noncompliant and eats virtually flat in bed most of the time, procalcitonin stable, blood cultures negative.  Give him a short course of Keflex and Flagyl, wanted to monitor him another 24 hours but he refuses to stay and says he will sign out AMA on 10/10/2019.  Will be discharged with as much help we can provide him at  home as possible.   Weakness and deconditioning.  Unable to take care of himself at home, family members living with him are all acutely sick with COVID-19 infection.  PT OT eval.  May require placement to SNF for safe disposition.  Note he was discharged home but could not take care of himself and came right back.  Had detailed discussions with social worker on 10/07/2019, per social work department discharge home.  UTI: Urine culture is inconclusive was treated with empiric antibiotics for this problem.  COPD on chronic hypoxemic respiratory failure: Stable-continue bronchodilators  Chronic diastolic heart failure: His blood pressure chronically has been running on the lower side held Lasix here and cut dose in half upon discharge, TED stockings added, PCP to monitor and adjust.  Have added midodrine to stabilize blood pressures.  Suspicion for OSA: Stable for outpatient follow-up.  DM type II.  Continue home Rx.  Lab Results  Component Value Date   HGBA1C 6.5 (H) 09/04/2019     Discharge diagnosis     Principal Problem:   COVID-19 Active Problems:   Chronic respiratory failure with hypoxia (HCC)   COPD exacerbation (HCC)   HTN (hypertension)   UTI (urinary tract infection)   COVID-19 virus infection    Discharge instructions    Discharge Instructions    Diet - low sodium heart healthy   Complete by: As directed    Discharge instructions   Complete by: As directed    Follow with Primary MD Albertha Ghee, MD in 7 days   Get CBC, CMP, 2 view Chest X ray -  checked next visit within 1 week by Primary MD    Activity: As tolerated with Full fall precautions use walker/cane & assistance as needed  Disposition Home    Diet: Heart Healthy Low Carb    Special Instructions: If you have smoked or chewed Tobacco  in the last 2 yrs please stop smoking, stop any regular Alcohol  and or any Recreational drug use.  On your next visit with your primary care physician  please Get Medicines reviewed and adjusted.  Please request your Prim.MD to go over all Hospital Tests and Procedure/Radiological results at the follow up, please get all Hospital records sent to your Prim MD by signing hospital release before you go home.  If you experience worsening of your admission symptoms, develop shortness of breath, life threatening emergency, suicidal or homicidal thoughts you must seek medical attention immediately by calling 911 or calling your MD immediately  if symptoms less severe.  You Must read complete instructions/literature along with all the possible adverse reactions/side effects for all the Medicines you take and that have been prescribed to you. Take any new Medicines after you have completely understood and accpet all the possible adverse reactions/side effects.   Increase activity slowly   Complete by: As directed    MyChart COVID-19 home monitoring program   Complete by: Oct 10, 2019    Is the patient willing to use the MyChart Mobile App for home monitoring?: Yes   Temperature monitoring   Complete by: Oct 10, 2019    After how many days would you like to receive a notification of this patient's flowsheet entries?: 1      Discharge Medications   Allergies as of 10/10/2019      Reactions   Codeine    Penicillins Itching   Did it involve swelling of the face/tongue/throat, SOB, or low BP? No Did it involve sudden or severe rash/hives, skin peeling, or any reaction on the inside of your mouth or nose?Yes Did you need to seek medical attention at a hospital or doctor's office? N/A When did it last happen?N/A If all above answers are NO, may proceed with cephalosporin use.   Sulfa Antibiotics Rash      Medication List    STOP taking these medications   dexamethasone 2 MG tablet Commonly known as: DECADRON     TAKE these medications   acetaminophen 325 MG tablet Commonly known as: TYLENOL Take 650 mg by mouth every 6 (six) hours as  needed for mild pain.   albuterol 108 (90 Base) MCG/ACT inhaler Commonly known as: VENTOLIN HFA Inhale 2 puffs into the lungs every 6 (six) hours as needed for wheezing or shortness of breath.   albuterol (2.5 MG/3ML) 0.083% nebulizer solution Commonly known as: PROVENTIL Take 3 mLs (2.5 mg  total) by nebulization every 6 (six) hours as needed for wheezing or shortness of breath.   ascorbic acid 500 MG tablet Commonly known as: VITAMIN C Take 1 tablet (500 mg total) by mouth daily for 10 days.   cephALEXin 500 MG capsule Commonly known as: KEFLEX Take 1 capsule (500 mg total) by mouth 3 (three) times daily for 5 days.   famotidine 20 MG tablet Commonly known as: PEPCID Take 1 tablet (20 mg total) by mouth daily for 10 days.   furosemide 20 MG tablet Commonly known as: LASIX Take 1 tablet (20 mg total) by mouth daily. Start taking on: October 14, 2019 What changed:   how much to take  These instructions start on October 14, 2019. If you are unsure what to do until then, ask your doctor or other care provider.   guaiFENesin-dextromethorphan 100-10 MG/5ML syrup Commonly known as: ROBITUSSIN DM Take 10 mLs by mouth every 4 (four) hours as needed for cough.   metroNIDAZOLE 500 MG tablet Commonly known as: Flagyl Take 1 tablet (500 mg total) by mouth 3 (three) times daily for 14 days.   midodrine 5 MG tablet Commonly known as: PROAMATINE Take 1 tablet (5 mg total) by mouth 3 (three) times daily with meals.   tamsulosin 0.4 MG Caps capsule Commonly known as: FLOMAX Take 1 capsule (0.4 mg total) by mouth daily after supper.   umeclidinium-vilanterol 62.5-25 MCG/INH Aepb Commonly known as: ANORO ELLIPTA Inhale 1 puff into the lungs daily.   zinc sulfate 220 (50 Zn) MG capsule Take 1 capsule (220 mg total) by mouth daily for 10 days.       Follow-up Information    Albertha Ghee, MD. Schedule an appointment as soon as possible for a visit in 1 week(s).   Specialty:  Internal Medicine Contact information: [address] [place] [address] [phone]           Major procedures and Radiology Reports - PLEASE review detailed and final reports thoroughly  -        Ct Angio Chest Pe W/cm &/or Wo Cm  Result Date: 09/21/2019 CLINICAL DATA:  Shortness of breath, PE suspected, high pretest probability EXAM: CT ANGIOGRAPHY CHEST WITH CONTRAST TECHNIQUE: Multidetector CT imaging of the chest was performed using the standard protocol during bolus administration of intravenous contrast. Multiplanar CT image reconstructions and MIPs were obtained to evaluate the vascular anatomy. CONTRAST:  75mL OMNIPAQUE IOHEXOL 350 MG/ML SOLN COMPARISON:  Most recent CTA chest 09/05/2019 FINDINGS: Cardiovascular: Suboptimal opacification of the pulmonary arteries. Contrast opacifies to the level of the proximal segmental arteries with more distal evaluation limited by bolus and respiratory motion. No central, lobar or proximal segmental pulmonary arterial filling defects are seen. Central pulmonary arteries are normal caliber. Atherosclerotic plaque within the normal caliber aorta. Shared origin of the brachiocephalic and left common carotid. Atheromatous plaque within the proximal great vessels. Normal heart size. Small volume pericardial fluid is likely within physiologic normal. Aortic leaflet calcifications are present. Coronary artery calcifications are present as well. Mediastinum/Nodes: Multiple reactive appearing though nonenlarged lymph nodes are present in the mediastinum and bilateral hila. Thyroid gland and thoracic inlet are unremarkable. Bowing of the posterior trachea reflects imaging during exhalation. No acute tracheal abnormality. The esophagus is unremarkable. Lungs/Pleura: There are patchy multifocal areas of mixed reticular interstitial and ground-glass opacity most pronounced in the periphery of the posterior segments of both upper lobes, within the  lingula and right lung base. Small bilateral pleural  effusions are present as well. No pneumothorax. Upper Abdomen: Nodular thickening of the adrenal glands is similar to comparison studies No acute abnormalities present in the visualized portions of the upper abdomen. Musculoskeletal: Multilevel degenerative changes are present in the imaged portions of the spine. More severe changes are noted in the lower cervical spine, incompletely evaluated on this CT exam. Mild left and severe right osteoarthrosis of the shoulders. Review of the MIP images confirms the above findings. IMPRESSION: 1. Suboptimal opacification of the pulmonary arteries but without visible central, lobar or proximal segmental pulmonary arterial filling defects. 2. Patchy multifocal areas of mixed reticular interstitial and ground-glass opacity most pronounced in the periphery of the posterior segments of both upper lobes, within the lingula and right lung base, concerning for a possible acute infectious or inflammatory process. Reactive adenopathy present in the mediastinum and hila. 3. Small bilateral pleural effusions. 4. Nodular thickening of the adrenal glands likely reflecting senescent adrenal hyperplasia. 5. Aortic Atherosclerosis (ICD10-I70.0). Electronically Signed   By: Kreg Shropshire M.D.   On: 09/21/2019 22:32   Dg Chest Port 1 View  Result Date: 10/08/2019 CLINICAL DATA:  Shortness of breath.  COVID-19 positive. EXAM: PORTABLE CHEST 1 VIEW COMPARISON:  09/21/2019 FINDINGS: Midline trachea. Normal heart size. No pleural effusion or pneumothorax. Bibasilar pulmonary opacities are minimally improved. There is subtle lateral right midlung airspace disease which is new or more conspicuous since the prior radiograph. IMPRESSION: Shifting pulmonary opacities with improved bibasilar aeration and new lateral right upper lobe mild airspace disease. Findings suggest overall improvement in infection. Electronically Signed   By: Jeronimo Greaves  M.D.   On: 10/08/2019 16:38   Dg Chest Portable 1 View  Result Date: 09/21/2019 CLINICAL DATA:  Shortness of breath. EXAM: PORTABLE CHEST 1 VIEW COMPARISON:  September 06, 2019. FINDINGS: The heart size and mediastinal contours are within normal limits. No pneumothorax or pleural effusion is noted. Increased bibasilar atelectasis or edema is noted. No pneumothorax or pleural effusion is noted. The visualized skeletal structures are unremarkable. IMPRESSION: Increased bibasilar atelectasis or edema is noted. Electronically Signed   By: Lupita Raider M.D.   On: 09/21/2019 16:48    Micro Results    Recent Results (from the past 240 hour(s))  Urine Culture     Status: Abnormal   Collection Time: 10/02/19  8:20 PM   Specimen: Urine, Random  Result Value Ref Range Status   Specimen Description URINE, RANDOM  Final   Special Requests   Final    NONE Performed at [facility], [address] [place], [place] [address]    Culture MULTIPLE SPECIES PRESENT, SUGGEST RECOLLECTION (A)  Final   Report Status 10/04/2019 FINAL  Final  Culture, blood (routine x 2)     Status: None (Preliminary result)   Collection Time: 10/08/19  4:35 PM   Specimen: BLOOD  Result Value Ref Range Status   Specimen Description   Final    BLOOD LEFT ARM Performed at [facility], [address], [place], [place] [address]    Special Requests   Final    BOTTLES DRAWN AEROBIC ONLY Blood Culture adequate volume Performed at [facility], [address] [place], [place] [address]    Culture   Final    NO GROWTH 2 DAYS Performed at [facility], [address], [place], [place] [address]    Report Status PENDING  Incomplete  Culture, blood (routine x 2)  Status: None (Preliminary result)   Collection Time: 10/08/19  4:40 PM   Specimen: BLOOD LEFT HAND  Result Value Ref Range Status   Specimen Description   Final    BLOOD LEFT HAND Performed at [facility] [address], [place], [place] [address]    Special Requests   Final    BOTTLES DRAWN AEROBIC ONLY Blood Culture adequate volume Performed at [facility] [address] [place], [place] [address]    Culture   Final    NO GROWTH 2 DAYS Performed at [facility] [address], [place],  [address]    Report Status PENDING  Incomplete    Today   Subjective    Aaron Daniels today has no headache,no chest abdominal pain,no new weakness tingling or numbness, feels much better wants to go home today.    Objective   Blood pressure 105/66, pulse 86, temperature 97.6 F (36.4 C), temperature source Oral, resp. rate 18, height 5' 10.5" (1.791 m), weight 96.1 kg, SpO2 91 %.   Intake/Output Summary (Last 24 hours) at 10/10/2019 1036 Last data filed at 10/10/2019 0424 Gross per 24 hour  Intake 460 ml  Output 2525 ml  Net -2065 ml    Exam Awake Alert, Oriented x 3, No new F.N deficits, Normal affect .AT,PERRAL Supple Neck,No JVD, No cervical lymphadenopathy appriciated.  Symmetrical Chest wall movement, Good air movement bilaterally, CTAB RRR,No Gallops,Rubs or new Murmurs, No Parasternal Heave +ve B.Sounds, Abd Soft, Non tender, No organomegaly appriciated, No rebound -guarding or rigidity. No Cyanosis, Clubbing or edema, No new Rash or bruise   Data Review   CBC w Diff:  Lab Results  Component Value Date   WBC 4.9 10/10/2019   HGB 10.8 (L) 10/10/2019   HCT 34.6 (L) 10/10/2019   PLT 161 10/10/2019   LYMPHOPCT 20 10/10/2019   MONOPCT 8 10/10/2019   EOSPCT 3 10/10/2019   BASOPCT 0 10/10/2019    CMP:  Lab Results  Component Value Date   NA 136 10/10/2019   K 4.4 10/10/2019   CL 100 10/10/2019   CO2 28 10/10/2019   BUN 25 (H) 10/10/2019   CREATININE 1.06 10/10/2019   PROT 5.5 (L) 10/10/2019   ALBUMIN 2.3 (L) 10/10/2019   BILITOT 0.3 10/10/2019   ALKPHOS 50 10/10/2019   AST 21 10/10/2019   ALT 32  10/10/2019  .   Total Time in preparing paper work, data evaluation and todays exam - 12 minutes  Lala Lund M.D on 10/10/2019 at 10:36 AM  [facility]   Office  [phone]

## 2019-10-10 NOTE — Discharge Instructions (Signed)
Follow with Primary MD Albertha Ghee, MD in 7 days   Get CBC, CMP, 2 view Chest X ray -  checked next visit within 1 week by Primary MD    Activity: As tolerated with Full fall precautions use walker/cane & assistance as needed  Disposition Home    Diet: Heart Healthy Low Carb    Special Instructions: If you have smoked or chewed Tobacco  in the last 2 yrs please stop smoking, stop any regular Alcohol  and or any Recreational drug use.  On your next visit with your primary care physician please Get Medicines reviewed and adjusted.  Please request your Prim.MD to go over all Hospital Tests and Procedure/Radiological results at the follow up, please get all Hospital records sent to your Prim MD by signing hospital release before you go home.  If you experience worsening of your admission symptoms, develop shortness of breath, life threatening emergency, suicidal or homicidal thoughts you must seek medical attention immediately by calling 911 or calling your MD immediately  if symptoms less severe.  You Must read complete instructions/literature along with all the possible adverse reactions/side effects for all the Medicines you take and that have been prescribed to you. Take any new Medicines after you have completely understood and accpet all the possible adverse reactions/side effects.         Person Under Monitoring Name: Aaron Daniels  Location: [address] [place] [address]   Infection Prevention Recommendations for Individuals Confirmed to have, or Being Evaluated for, 2019 Novel Coronavirus (COVID-19) Infection Who Receive Care at Home  Individuals who are confirmed to have, or are being evaluated for, COVID-19 should follow the prevention steps below until a healthcare provider or local or state health department says they can return to normal activities.  Stay home except to get medical care You should restrict activities outside your home, except for getting  medical care. Do not go to work, school, or public areas, and do not use public transportation or taxis.  Call ahead before visiting your doctor Before your medical appointment, call the healthcare provider and tell them that you have, or are being evaluated for, COVID-19 infection. This will help the healthcare providers office take steps to keep other people from getting infected. Ask your healthcare provider to call the local or state health department.  Monitor your symptoms Seek prompt medical attention if your illness is worsening (e.g., difficulty breathing). Before going to your medical appointment, call the healthcare provider and tell them that you have, or are being evaluated for, COVID-19 infection. Ask your healthcare provider to call the local or state health department.  Wear a facemask You should wear a facemask that covers your nose and mouth when you are in the same room with other people and when you visit a healthcare provider. People who live with or visit you should also wear a facemask while they are in the same room with you.  Separate yourself from other people in your home As much as possible, you should stay in a different room from other people in your home. Also, you should use a separate bathroom, if available.  Avoid sharing household items You should not share dishes, drinking glasses, cups, eating utensils, towels, bedding, or other items with other people in your home. After using these items, you should wash them thoroughly with soap and water.  Cover your coughs and sneezes Cover your mouth and nose with a tissue when you cough or sneeze, or  you can cough or sneeze into your sleeve. Throw used tissues in a lined trash can, and immediately wash your hands with soap and water for at least 20 seconds or use an alcohol-based hand rub.  Wash your [facility] your hands often and thoroughly with soap and water for at least 20 seconds. You can use an  alcohol-based hand sanitizer if soap and water are not available and if your hands are not visibly dirty. Avoid touching your eyes, nose, and mouth with unwashed hands.   Prevention Steps for Caregivers and Household Members of Individuals Confirmed to have, or Being Evaluated for, COVID-19 Infection Being Cared for in the Home  If you live with, or provide care at home for, a person confirmed to have, or being evaluated for, COVID-19 infection please follow these guidelines to prevent infection:  Follow healthcare providers instructions Make sure that you understand and can help the patient follow any healthcare provider instructions for all care.  Provide for the patients basic needs You should help the patient with basic needs in the home and provide support for getting groceries, prescriptions, and other personal needs.  Monitor the patients symptoms If they are getting sicker, call his or her medical provider and tell them that the patient has, or is being evaluated for, COVID-19 infection. This will help the healthcare providers office take steps to keep other people from getting infected. Ask the healthcare provider to call the local or state health department.  Limit the number of people who have contact with the patient  If possible, have only one caregiver for the patient.  Other household members should stay in another home or place of residence. If this is not possible, they should stay  in another room, or be separated from the patient as much as possible. Use a separate bathroom, if available.  Restrict visitors who do not have an essential need to be in the home.  Keep older adults, very young children, and other sick people away from the patient Keep older adults, very young children, and those who have compromised immune systems or chronic health conditions away from the patient. This includes people with chronic heart, lung, or kidney conditions, diabetes, and  cancer.  Ensure good ventilation Make sure that shared spaces in the home have good air flow, such as from an air conditioner or an opened window, weather permitting.  Wash your hands often  Wash your hands often and thoroughly with soap and water for at least 20 seconds. You can use an alcohol based hand sanitizer if soap and water are not available and if your hands are not visibly dirty.  Avoid touching your eyes, nose, and mouth with unwashed hands.  Use disposable paper towels to dry your hands. If not available, use dedicated cloth towels and replace them when they become wet.  Wear a facemask and gloves  Wear a disposable facemask at all times in the room and gloves when you touch or have contact with the patients blood, body fluids, and/or secretions or excretions, such as sweat, saliva, sputum, nasal mucus, vomit, urine, or feces.  Ensure the mask fits over your nose and mouth tightly, and do not touch it during use.  Throw out disposable facemasks and gloves after using them. Do not reuse.  Wash your hands immediately after removing your facemask and gloves.  If your personal clothing becomes contaminated, carefully remove clothing and launder. Wash your hands after handling contaminated clothing.  Place all used disposable facemasks,  gloves, and other waste in a lined container before disposing them with other household waste.  Remove gloves and wash your hands immediately after handling these items.  Do not share dishes, glasses, or other household items with the patient  Avoid sharing household items. You should not share dishes, drinking glasses, cups, eating utensils, towels, bedding, or other items with a patient who is confirmed to have, or being evaluated for, COVID-19 infection.  After the person uses these items, you should wash them thoroughly with soap and water.  Wash laundry thoroughly  Immediately remove and wash clothes or bedding that have blood, body  fluids, and/or secretions or excretions, such as sweat, saliva, sputum, nasal mucus, vomit, urine, or feces, on them.  Wear gloves when handling laundry from the patient.  Read and follow directions on labels of laundry or clothing items and detergent. In general, wash and dry with the warmest temperatures recommended on the label.  Clean all areas the individual has used often  Clean all touchable surfaces, such as counters, tabletops, doorknobs, bathroom fixtures, toilets, phones, keyboards, tablets, and bedside tables, every day. Also, clean any surfaces that may have blood, body fluids, and/or secretions or excretions on them.  Wear gloves when cleaning surfaces the patient has come in contact with.  Use a diluted bleach solution (e.g., dilute bleach with 1 part bleach and 10 parts water) or a household disinfectant with a label that says EPA-registered for coronaviruses. To make a bleach solution at home, add 1 tablespoon of bleach to 1 quart (4 cups) of water. For a larger supply, add  cup of bleach to 1 gallon (16 cups) of water.  Read labels of cleaning products and follow recommendations provided on product labels. Labels contain instructions for safe and effective use of the cleaning product including precautions you should take when applying the product, such as wearing gloves or eye protection and making sure you have good ventilation during use of the product.  Remove gloves and wash hands immediately after cleaning.  Monitor yourself for signs and symptoms of illness Caregivers and household members are considered close contacts, should monitor their health, and will be asked to limit movement outside of the home to the extent possible. Follow the monitoring steps for close contacts listed on the symptom monitoring form.   ? If you have additional questions, contact your local health department or call the epidemiologist on call at [phone] (available 24/7). ? This  guidance is subject to change. For the most up-to-date guidance from [employer], please refer to their website: [email or web address].pl

## 2019-10-10 NOTE — TOC Transition Note (Addendum)
Transition of Care [facility]) - CM/SW Discharge Note   Patient Details  Name: Aaron Daniels MRN: [id number] Date of Birth: 12-18-1940  [facility] CM/SW Contact:  Shade Flood, LCSW Phone Number: 10/10/2019, 2:03 PM   Clinical Narrative:     Pt stable for dc. Plan remains for dc home with [facility]. Notified staff at [facility]. There are no other TOC needs for dc.  Received message that pt needs transport home. Arranged with [facility]. Form printed to floor. [facility] says earliest transport time will be 5:30. Updated pt's RN.  At 5:15 received call from [place] at [place] stating that they cannot provide care at home due to the home situation. Pt has [employer] Medicare and a poor home situation. No HH agency is available for this patient. Discussed with MD. Damaris Schooner with pt by phone to inform. Pt aware that we cannot find [facility] agency for him. He states he has been waiting for five days to go home and he is not staying. He understands that RN/PT/OT will not follow in the home. He expresses that he is okay with that. Updated MD again and plan remains for pt to dc home via [facility]. Updated RN as well.    Barriers to Discharge: Barriers Resolved   Patient Goals and CMS Choice        Discharge Placement                       Discharge Plan and Services                          HH Arranged: Social Work   Date [place]: 10/10/19 Time [place]: 1403 Representative spoke with at [place]: [place] ([place]) Interventions     Readmission Risk Interventions No flowsheet data found.

## 2019-10-10 NOTE — Care Management Important Message (Signed)
Important Message  Patient Details  Name: Timathy Newberry MRN: [id number] Date of Birth: February 28, 1941   Medicare Important Message Given:  Yes - Important Message mailed due to current National Emergency  Verbal consent obtained due to current National Emergency  Relationship to patient: Child Contact Name: Brolin Dambrosia Call Date: 10/10/19  Time: 1428 Phone: [phone] Outcome: Spoke with contact Important Message mailed to: Patient address on file   [place] 10/10/2019, 2:28 PM

## 2019-10-10 NOTE — Plan of Care (Signed)
Pt slept well during the night. No complaints of pain verbalized. Alert and oriented. Vitals stable on 2L [place] (baseline). Low grade fever (100 F) noted last night, afebrile this AM.  Dry cough noted, no complaints of dyspnea. Minimal assist with ADLs.  Skin assessed, intact. IV  SL. Able to self-reposition for pressure relief. Unable to tolerate prone positioning for overnight. No other issues, will monitor.   Problem: Respiratory: Goal: Will maintain a patent airway Outcome: Progressing Goal: Complications related to the disease process, condition or treatment will be avoided or minimized Outcome: Progressing   Problem: Education: Goal: Knowledge of General Education information will improve Description: Including pain rating scale, medication(s)/side effects and non-pharmacologic comfort measures Outcome: Progressing   Problem: Health Behavior/Discharge Planning: Goal: Ability to manage health-related needs will improve Outcome: Progressing

## 2019-10-13 LAB — CULTURE, BLOOD (ROUTINE X 2)
Culture: NO GROWTH
Culture: NO GROWTH
Special Requests: ADEQUATE
Special Requests: ADEQUATE

## 2019-10-16 ENCOUNTER — Telehealth: Payer: Self-pay | Admitting: Internal Medicine

## 2019-10-16 NOTE — Telephone Encounter (Signed)
Spoke w/ Almyra Free RN, care [facility], she has a couple of questions from todays first visit since disch from [place]:  1) BP 122/70 this am before meds, should pt continue MIDODRINE 5mg  3 times daily or decrease?  2) pt's caregiver/ son ask where the PULSE OX was, he stated he was told he would be given one to check pt 02 sat, can you put an order in or would you prefer not?  Almyra Free 336 [phone]

## 2019-10-16 NOTE — Telephone Encounter (Signed)
[facility] Nurse calling to discuss patient medications he was D/C on.  Pt was [facility] to [facility] today. Please call back

## 2019-10-20 DIAGNOSIS — J449 Chronic obstructive pulmonary disease, unspecified: Secondary | ICD-10-CM | POA: Diagnosis not present

## 2019-10-20 MED ORDER — PULSE OXIMETER MISC: 1.0000 [IU] | 0 refills | Status: DC | PRN

## 2019-10-20 NOTE — Telephone Encounter (Signed)
Pt care connections nurse is calling again she would like answer

## 2019-10-20 NOTE — Telephone Encounter (Signed)
Spoke to [employer]. Continue Midodrine 5mg  TID. Will order pulse ox   Tamsen Snider, MD PGY1  [phone]

## 2019-10-21 ENCOUNTER — Telehealth: Payer: Self-pay | Admitting: Internal Medicine

## 2019-10-21 DIAGNOSIS — J449 Chronic obstructive pulmonary disease, unspecified: Secondary | ICD-10-CM

## 2019-10-23 ENCOUNTER — Telehealth: Payer: Self-pay | Admitting: Pulmonary Disease

## 2019-10-23 NOTE — Telephone Encounter (Addendum)
Spoke with pt's son and he reports te pt does not want to wait to have the PFT done in 3 months due to pt having covid 19. His PFT was cancelled due to him having the [facility] and they stated he had to wait to come in to perform the test. BW is there anything we can do? I advised his son that there may nt be anything we could do. Does he warrant a PFT so soon or can we comfortably tell him its ok to wait?  On 10/5 BW stated this in her Orders: - New nebulizer machine  - Needs in-lab sleep test JO:ACZYSAY/TKZ time fatigue - Needs PFTs in the next 2-4 weeks   He tested positive on 10/11 for Covid 19.

## 2019-10-24 NOTE — Telephone Encounter (Signed)
We can do them at the hospital or we could get a later apt here if okayed by Patrice/Tammy wilson (I would stay)

## 2019-10-24 NOTE — Telephone Encounter (Signed)
Patrice/Tammy can we do this?

## 2019-10-27 ENCOUNTER — Telehealth: Payer: Self-pay

## 2019-10-27 ENCOUNTER — Ambulatory Visit (INDEPENDENT_AMBULATORY_CARE_PROVIDER_SITE_OTHER): Payer: Medicare Other | Admitting: Internal Medicine

## 2019-10-27 ENCOUNTER — Other Ambulatory Visit: Payer: Self-pay

## 2019-10-27 ENCOUNTER — Encounter: Payer: Self-pay | Admitting: Internal Medicine

## 2019-10-27 VITALS — BP 94/65 | HR 110 | Temp 97.9°F | Ht 70.5 in | Wt 221.9 lb

## 2019-10-27 DIAGNOSIS — G301 Alzheimer's disease with late onset: Secondary | ICD-10-CM

## 2019-10-27 DIAGNOSIS — I110 Hypertensive heart disease with heart failure: Secondary | ICD-10-CM

## 2019-10-27 DIAGNOSIS — F0391 Unspecified dementia with behavioral disturbance: Secondary | ICD-10-CM

## 2019-10-27 DIAGNOSIS — G4700 Insomnia, unspecified: Secondary | ICD-10-CM

## 2019-10-27 DIAGNOSIS — Z9981 Dependence on supplemental oxygen: Secondary | ICD-10-CM

## 2019-10-27 DIAGNOSIS — E119 Type 2 diabetes mellitus without complications: Secondary | ICD-10-CM

## 2019-10-27 DIAGNOSIS — G3109 Other frontotemporal dementia: Secondary | ICD-10-CM

## 2019-10-27 DIAGNOSIS — Z79899 Other long term (current) drug therapy: Secondary | ICD-10-CM

## 2019-10-27 DIAGNOSIS — U071 COVID-19: Secondary | ICD-10-CM | POA: Diagnosis not present

## 2019-10-27 DIAGNOSIS — I10 Essential (primary) hypertension: Secondary | ICD-10-CM

## 2019-10-27 DIAGNOSIS — Z7951 Long term (current) use of inhaled steroids: Secondary | ICD-10-CM

## 2019-10-27 DIAGNOSIS — F0281 Dementia in other diseases classified elsewhere with behavioral disturbance: Secondary | ICD-10-CM

## 2019-10-27 DIAGNOSIS — Z8701 Personal history of pneumonia (recurrent): Secondary | ICD-10-CM

## 2019-10-27 DIAGNOSIS — I5030 Unspecified diastolic (congestive) heart failure: Secondary | ICD-10-CM

## 2019-10-27 DIAGNOSIS — G309 Alzheimer's disease, unspecified: Secondary | ICD-10-CM

## 2019-10-27 DIAGNOSIS — J9611 Chronic respiratory failure with hypoxia: Secondary | ICD-10-CM

## 2019-10-27 DIAGNOSIS — Z8619 Personal history of other infectious and parasitic diseases: Secondary | ICD-10-CM

## 2019-10-27 DIAGNOSIS — J449 Chronic obstructive pulmonary disease, unspecified: Secondary | ICD-10-CM

## 2019-10-27 MED ORDER — FUROSEMIDE 20 MG PO TABS: 40.0000 mg | ORAL_TABLET | Freq: Every day | ORAL | 2 refills | Status: DC

## 2019-10-27 MED ORDER — RAMELTEON 8 MG PO TABS: 8.0000 mg | ORAL_TABLET | Freq: Every evening | ORAL | 1 refills | Status: DC | PRN

## 2019-10-27 MED ORDER — MULTI-VITAMIN/MINERALS PO TABS: 1.0000 | ORAL_TABLET | Freq: Every day | ORAL | 2 refills | Status: AC

## 2019-10-27 NOTE — Telephone Encounter (Signed)
Sending message as Juluis Rainier per patient son request.    Call returned to son [name], made him aware we could potentially schedule at the hospital but it would be more expensive. He is wanting it to be done here in the office preferably so he states he will wait to hear from [place]. Son is stating that they need an appt with his PCP due to his behavior issues. He states he thinks he may have dementia or even Alzheimer's. He reports his father is resistant to anything he does and does not remember much. He states he is very mean and easily agitated.  I inquired as to whether he would be interested in home health, declined stating his parents are hoarding and they would not be able to have someone in the home.  I made him aware he already has an appt this afternoon. Confirmed the appt time and location. He is concerned that his father is insistent on wanting to drive, he tries to take his keys but states that is not a easy task. He is wanting to know if he can be placed in a nursing home. He is concerned that he needs facility placement however he states his father cannot know these things came from him as this would upset him and make him more resistant to his care. He states he is not able to get him to take his medication. Son is insistent that these things are not discussed in front of the patient. I made him aware that I would get this message sent to Dr. Otis Brace office to make them aware. Separate message created and sent to Dr. Court Joy.

## 2019-10-27 NOTE — Progress Notes (Signed)
CC: COVID-19  HPI: Aaron Daniels is a 78 y.o. M w/ PMH of COPD, chronic hypoxic respiratory failure, HFpEF, T2DM and recent COVID-19 pneumonia who presents for hospital follow up visit. Aaron Daniels was examined and evaluated with wife and son present. He was observed to be in irritable mood. He mentions that he has been continuing to have significant dyspnea on exertion and requiring additional oxygen from 2L at baseline up to 4L at home. He mentions that he does not want to take any medications at home including his maintenance COPD inhalers as 'they don't do anything.' He also mentions that these medications are only being prescribed for profits and they are not helpful at all. His son also spoke with me privately regarding significant concern for safety at home as he describes multiple episodes of him being soiled and not able to perform ADLs by himself. He also mentions concern regarding unsecured firearm at home as well as 'hoarding situation' with 'piles of clothes reaching the ceiling.'  On chart review, APS and [place] team has been previously involved with attempt to admit him to nursing home but patient was deemed capable of medical decision making and has repeatedly refused. However, recently he has had a readmission due to inability to care for himself at home. Hospitalist team attempted to transition to SNF for discharge but [name] threatened AMA and he was discharged prior to finding placement.  Past Medical History:  Diagnosis Date  . COPD (chronic obstructive pulmonary disease) ([place])   . Hypertension    Review of Systems: Review of Systems  Constitutional: Negative for chills, fever and malaise/fatigue.  Eyes: Negative for blurred vision.  Respiratory: Positive for shortness of breath. Negative for cough, sputum production and wheezing.   Cardiovascular: Negative for chest pain and leg swelling.  Gastrointestinal: Negative for constipation, diarrhea, nausea and  vomiting.  Neurological: Negative for dizziness and headaches.    Physical Exam: Vitals:   10/27/19 1346  BP: 94/65  Pulse: (!) 110  Temp: 97.9 F (36.6 C)  TempSrc: Oral  SpO2: 97%  Weight: 221 lb 14.4 oz (100.7 kg)  Height: 5' 10.5" (1.791 m)   Physical Exam  Constitutional: He is oriented to person, place, and time. He appears well-developed and well-nourished. No distress.  HENT:  Mouth/Throat: Oropharynx is clear and moist.  Eyes: Conjunctivae are normal.  Cardiovascular: Normal rate, regular rhythm, normal heart sounds and intact distal pulses.  No murmur heard. Respiratory: Effort normal. He has no wheezes. He has rales (bibasilar rales).  GI: Soft. Bowel sounds are normal. He exhibits no distension. There is no abdominal tenderness.  Musculoskeletal: Normal range of motion.        General: Edema (+1 pitting edema) present.  Neurological: He is alert and oriented to person, place, and time.  Skin: Skin is warm and dry.  Psychiatric:  Irritable    Assessment & Plan:   HTN (hypertension) BP Readings from Last 3 Encounters:  10/27/19 94/65  10/10/19 112/75  09/30/19 105/73   Previously on lisinopril but stopped due to hypotension during admission. Advised to c/w cessation of lisinopril at the moment.  - Monitor  COPD (chronic obstructive pulmonary disease) (HCC) Currently satting 97% on 2L oxygen. Follows w/ [facility]. Mentions significant dyspnea with minimal exertion such as walking up stairs or >1 block. Using albuterol nebulizer about 4 times daily. Does not use his maintenance inhaler (Anoro). Mentions that he has not seen significant improvement with Anoro. Discussed in detail regarding  importance of continuing maintenance inhaler to prevent exacerbations. Mentions that 'doctors are just in it for the money.' Son mentions he often does not use home oxygen appropriately and is worried about possible fire as he uses Radiation protection practitioner at home.  - C/w Anoro  1puff daily - Currently in process of getting concentrator - Need to get PFTs - F/u with pulm  COVID-19 Recently admitted for COVID-19 pneumonia in October. Treated w/ remdesavir, steroids now >3 weeks out. Denies any fevers, chills, nausea, vomiting, sensory change, sore throat or cough.   Dementia of Alzheimer's type with behavioral disturbance [facility]) Presents w/ son and wife who both provide concern regarding safety at home. Aaron Daniels mentions that he is fine and he doesn't have any issues with memory and he does not need any help at home. However, on chart review, OT eval shows worsening cognitive function and short term memory. Refused MOCA eval to examine cognitive function in office and was observed to have significant irritable mood. Currently denies any intention of self-harm or harm to others.  In private, son and wife mentions significant concerns w/ Aaron Daniels's reduction in ability for self-care and need for transition to supervised setting. Son mentions multiple episodes of Aaron Daniels sitting in his own urine and feces as well as concern regarding presence of unsecured firearm at home which he tried to remove unsuccessfully. HPI provides further detail, including hx of threatening AMA from hospital stay and medication non-adherence.  It is my medical opinion that Aaron Daniels does not have capacity for medical decision making especially as he exhibits paranoid assertions regarding appropriate medical treatment. Would benefit from being admitted to memory unit, although during recent COVID pandemic this has been known to be difficult. Will refer to psychiatry for further evaluation and assistnace. Competence will need to to assessed by a judge. Adult Protective Services has also been contacted regarding safety at home.  - Son states medical therapy at home would be difficult due to hx of non-adherence and difficulty managing meds himself w/o supervision - APS contacted - Referral to psychiatry  -  Referral to [facility] - Ramelteon for insomnia  Heart failure with preserved ejection fraction (HCC) Presents w/ dyspnea. 97% at baseline home oxygen 2L. Denies any orthopnea, light-headedness, chest pain, palpitations. Hx of HFpEF w/ EF 65-70 on TTE on 08/2019. Prescribed furosemide 20mg  at discharge but son states he has been trying to have Mr.Orama take 40mg  due to increased weight (100.7kg this visit) since discharge (at discharge 96.1kg) Complicated by hx of medication non-adherence and refusal to follow dietary recommendations.  - Advised fluid restriction, low salt diet - C/w furosemide 40mg  daily - BMP   Patient discussed with Dr.  - , PGY2 [facility] Internal Medicine Pager: [phone]

## 2019-10-27 NOTE — Telephone Encounter (Signed)
In error

## 2019-10-27 NOTE — Patient Instructions (Signed)
Dear [name],  Thank you for allowing [place] to provide your care today. Today we discussed your shortness of breath    I have ordered cbc, bmp labs for you. I will call if any are abnormal.    Today we made the following changes to your medications:    Continue furosemide 40mg  daily Start ramelteon 8mg  at nighttime  Please follow-up with PCP.    Should you have any questions or concerns please call the internal medicine clinic at [phone].    Thank you for choosing [facility].

## 2019-10-27 NOTE — Telephone Encounter (Signed)
Call returned to [name], made him aware we could potentially schedule at the hospital but it would be more expensive. He is wanting it to be done here in the office preferably so he states he will wait to hear from [place]. Son is stating that they need an appt with his PCP due to his behavior issues. He states he thinks he may have dementia or even Alzheimer's. He reports his father is resistant to anything he does and does not remember much. He states he is very mean and easily agitated.  I inquired as to whether he would be interested in home health, declined stating his parents are hoarding and they would not be able to have someone in the home.  I made him aware he already has an appt this afternoon. Confirmed the appt time and location. He is concerned that his father is insistent on wanting to drive, he tries to take his keys but states that is not a easy task. He is wanting to know if he can be placed in a nursing home. He is concerned that he needs facility placement however he states his father cannot know these things came from him as this would upset him and make him more resistant to his care. He states he is not able to get him to take his medication. Son is insistent that these things are not discussed in front of the patient. I made him aware that I would get this message sent to Dr. Otis Brace office to make them aware. Separate message created and sent to Dr. Court Joy.   Will await a response from Tammy/Patrice.

## 2019-10-28 ENCOUNTER — Other Ambulatory Visit: Payer: Self-pay

## 2019-10-28 ENCOUNTER — Telehealth: Payer: Self-pay | Admitting: *Deleted

## 2019-10-28 DIAGNOSIS — G309 Alzheimer's disease, unspecified: Secondary | ICD-10-CM | POA: Insufficient documentation

## 2019-10-28 DIAGNOSIS — F0281 Dementia in other diseases classified elsewhere with behavioral disturbance: Secondary | ICD-10-CM | POA: Insufficient documentation

## 2019-10-28 LAB — BMP8+ANION GAP
Anion Gap: 16 mmol/L (ref 10.0–18.0)
BUN/Creatinine Ratio: 11 (ref 10–24)
BUN: 10 mg/dL (ref 8–27)
CO2: 26 mmol/L (ref 20–29)
Calcium: 8.7 mg/dL (ref 8.6–10.2)
Chloride: 97 mmol/L (ref 96–106)
Creatinine, Ser: 0.93 mg/dL (ref 0.76–1.27)
GFR calc Af Amer: 89 mL/min/{1.73_m2} (ref >59)
GFR calc non Af Amer: 77 mL/min/{1.73_m2} (ref >59)
Glucose: 125 mg/dL — ABNORMAL HIGH (ref 65–99)
Potassium: 4.1 mmol/L (ref 3.5–5.2)
Sodium: 139 mmol/L (ref 134–144)

## 2019-10-28 LAB — CBC WITH DIFFERENTIAL/PLATELET
Basophils Absolute: 0 10*3/uL (ref 0.0–0.2)
Basos: 1 %
EOS (ABSOLUTE): 0.1 10*3/uL (ref 0.0–0.4)
Eos: 2 %
Hematocrit: 34.1 % — ABNORMAL LOW (ref 37.5–51.0)
Hemoglobin: 11.5 g/dL — ABNORMAL LOW (ref 13.0–17.7)
Immature Grans (Abs): 0 10*3/uL (ref 0.0–0.1)
Immature Granulocytes: 0 %
Lymphocytes Absolute: 1.5 10*3/uL (ref 0.7–3.1)
Lymphs: 28 %
MCH: 29.4 pg (ref 26.6–33.0)
MCHC: 33.7 g/dL (ref 31.5–35.7)
MCV: 87 fL (ref 79–97)
Monocytes Absolute: 0.5 10*3/uL (ref 0.1–0.9)
Monocytes: 9 %
Neutrophils Absolute: 3.2 10*3/uL (ref 1.4–7.0)
Neutrophils: 60 %
Platelets: 261 10*3/uL (ref 150–450)
RBC: 3.91 x10E6/uL — ABNORMAL LOW (ref 4.14–5.80)
RDW: 15.4 % (ref 11.6–15.4)
WBC: 5.3 10*3/uL (ref 3.4–10.8)

## 2019-10-28 NOTE — Assessment & Plan Note (Signed)
Presents w/ dyspnea. 97% at baseline home oxygen 2L. Denies any orthopnea, light-headedness, chest pain, palpitations. Hx of HFpEF w/ EF 65-70 on TTE on 08/2019. Prescribed furosemide 20mg  at discharge but son states he has been trying to have Aaron Daniels take 40mg  due to increased weight (100.7kg this visit) since discharge (at discharge 07.1QR) Complicated by hx of medication non-adherence and refusal to follow dietary recommendations.  - Advised fluid restriction, low salt diet - C/w furosemide 40mg  daily - BMP

## 2019-10-28 NOTE — Assessment & Plan Note (Addendum)
Recently admitted for COVID-19 pneumonia in October. Treated w/ remdesavir, steroids now >3 weeks out. Denies any fevers, chills, nausea, vomiting, sensory change, sore throat or cough.

## 2019-10-28 NOTE — Assessment & Plan Note (Addendum)
BP Readings from Last 3 Encounters:  10/27/19 94/65  10/10/19 112/75  09/30/19 105/73   Previously on lisinopril but stopped due to hypotension during admission. Advised to c/w cessation of lisinopril at the moment.  - Monitor
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
## 2019-10-29 ENCOUNTER — Telehealth: Payer: Self-pay | Admitting: Internal Medicine

## 2019-10-29 NOTE — Telephone Encounter (Signed)
One option is for the same to call 911 and have the patient be transported to [facility] ER for psychiatric evaluation.  The son will need to emphasize that this is a psychiatric issue and not a medical issue.

## 2019-10-29 NOTE — Telephone Encounter (Signed)
Attempted to call Aaron Daniels's son to discuss medication pricing. Mr.Apuzzo did not pick up. Voicemail full. Will attempt later.

## 2019-10-29 NOTE — Progress Notes (Signed)
Internal Medicine Clinic Attending ° °Case discussed with Dr. Lee at the time of the visit.  We reviewed the resident’s history and exam and pertinent patient test results.  I agree with the assessment, diagnosis, and plan of care documented in the resident’s note.  °

## 2019-10-29 NOTE — Telephone Encounter (Signed)
Yeah he is aware that's an option but he mentions using it as last resort.

## 2019-10-30 ENCOUNTER — Ambulatory Visit (INDEPENDENT_AMBULATORY_CARE_PROVIDER_SITE_OTHER): Payer: Medicare Other | Admitting: Pulmonary Disease

## 2019-10-30 ENCOUNTER — Telehealth: Payer: Self-pay | Admitting: Internal Medicine

## 2019-10-30 ENCOUNTER — Other Ambulatory Visit: Payer: Self-pay

## 2019-10-30 ENCOUNTER — Encounter: Payer: Self-pay | Admitting: Pulmonary Disease

## 2019-10-30 VITALS — BP 118/70 | HR 85 | Temp 98.0°F | Ht 70.5 in | Wt 220.2 lb

## 2019-10-30 DIAGNOSIS — J449 Chronic obstructive pulmonary disease, unspecified: Secondary | ICD-10-CM

## 2019-10-30 DIAGNOSIS — F0281 Dementia in other diseases classified elsewhere with behavioral disturbance: Secondary | ICD-10-CM

## 2019-10-30 DIAGNOSIS — Z8619 Personal history of other infectious and parasitic diseases: Secondary | ICD-10-CM | POA: Diagnosis not present

## 2019-10-30 DIAGNOSIS — J9611 Chronic respiratory failure with hypoxia: Secondary | ICD-10-CM | POA: Diagnosis not present

## 2019-10-30 DIAGNOSIS — Z9981 Dependence on supplemental oxygen: Secondary | ICD-10-CM | POA: Diagnosis not present

## 2019-10-30 DIAGNOSIS — Z8616 Personal history of COVID-19: Secondary | ICD-10-CM

## 2019-10-30 DIAGNOSIS — F02818 Dementia in other diseases classified elsewhere, unspecified severity, with other behavioral disturbance: Secondary | ICD-10-CM

## 2019-10-30 MED ORDER — CITALOPRAM HYDROBROMIDE 20 MG PO TABS: 20.0000 mg | ORAL_TABLET | Freq: Every day | ORAL | 2 refills | Status: DC
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
## 2019-10-30 NOTE — Telephone Encounter (Signed)
RTC to patient's son and RN informed him Dr. Truman Hayward attempted to call him back yesterday, but there was no answer and VM Box was full.  RN asked if there was a better number, pt's son states no and for MD to call on same number, [phone].  He states patient has an appt today at 4pm w/ Pulmonologist and to please call back this morning if possible.  See previous note, callback regarding Ramelteon medication (too expensive per pt's son) Will forward to Dr. Truman Hayward. SChaplin, RN,BSN

## 2019-10-30 NOTE — Telephone Encounter (Signed)
Pt son is requesting a callback, regarding medicne [phone]

## 2019-10-30 NOTE — Patient Instructions (Addendum)
Thank you for visiting Dr. Valeta Harms at [facility]. Today we recommend the following: Orders Placed This Encounter  Procedures  . Amb Referral to Palliative Care   Continue albuterol as needed for sob/wheezing   Return in about 4 months (around 02/27/2020), or if symptoms worsen or fail to improve.    Please do your part to reduce the spread of COVID-19.

## 2019-10-30 NOTE — Telephone Encounter (Signed)
Can you help with this?

## 2019-10-30 NOTE — Telephone Encounter (Signed)
Discussed with [name] regarding f/u for his dementia. He mentions ramelteon was too expensive. Discussed alternative option of starting his previously prescribed SSRI which was more affordable for him. [name] expressed understanding. Order for citalopram 20mg  sent to pharmacy. Discussed lack of progress with finding placement to memory unit as he had refused referral to psychiatry but explained that his issues with guardianship/competence cannot proceed forward until he sees psychiatry and reminded him that when he feels that [place] is a threat to self or others, he needs to call 911 to bring him to [facility] for psych evaluation. [name] expressed understanding.

## 2019-11-04 ENCOUNTER — Other Ambulatory Visit (HOSPITAL_COMMUNITY): Payer: Medicare Other

## 2019-11-04 ENCOUNTER — Telehealth: Payer: Self-pay | Admitting: *Deleted

## 2019-11-04 NOTE — Telephone Encounter (Signed)
Pt was seen in clinic. Call made to son. He states he no longer needs the PFT. He states he and Dr. Valeta Harms spoke and they are not moving forward with the PFT.   PFT and Covid Test cancelled.   Nothing further needed at this time.

## 2019-11-04 NOTE — Patient Outreach (Signed)
[facility]) Care Management  11/04/2019  Aaron Daniels [birth year] [id number]   CSW received referral from [place] for placement assistance. CSW attempted to reach patient's son, Aaron Daniels via phone but was unsuccessful on both his home number ("your call cannot be completed as the called party is temporarily unavailable, please try again later") and his cellphone ("the mailbox is full and cannot accept messages at this time"). CSW will have unsuccessful outreach letter mailed and will try again in 3-4 days, unless patient's son reaches back out.    Raynaldo Opitz, LCSW [facility]  Clinical Social Worker cell #: [phone]

## 2019-11-09 DIAGNOSIS — J449 Chronic obstructive pulmonary disease, unspecified: Secondary | ICD-10-CM | POA: Diagnosis not present

## 2019-11-10 ENCOUNTER — Ambulatory Visit: Payer: Self-pay | Admitting: *Deleted

## 2019-11-14 ENCOUNTER — Telehealth: Payer: Self-pay

## 2019-11-14 NOTE — Telephone Encounter (Signed)
Phone call placed to patient's son to introduce Palliative Care and to offer to schedule a visit with NP. Home phone rolls over to son's cell number. Unable to leave a message as mailbox is full

## 2019-11-17 ENCOUNTER — Other Ambulatory Visit: Payer: Self-pay | Admitting: *Deleted

## 2019-11-17 ENCOUNTER — Telehealth: Payer: Self-pay | Admitting: Internal Medicine

## 2019-11-17 MED ORDER — TAMSULOSIN HCL 0.4 MG PO CAPS: 0.4000 mg | ORAL_CAPSULE | Freq: Every day | ORAL | 1 refills | Status: DC

## 2019-11-17 NOTE — Telephone Encounter (Signed)
Opened in error

## 2019-11-17 NOTE — Telephone Encounter (Signed)
Pt' son unsure if patient needs to still take for following medication.  He would like a call back.   tamsulosin (FLOMAX) 0.4 MG CAPS capsule  [id number] - [place], [place] - [address]

## 2019-11-18 NOTE — Telephone Encounter (Signed)
Spoke to son, reassured that pt should cont flomax, refill sent

## 2019-11-19 DIAGNOSIS — J449 Chronic obstructive pulmonary disease, unspecified: Secondary | ICD-10-CM | POA: Diagnosis not present

## 2019-11-20 ENCOUNTER — Other Ambulatory Visit: Payer: Self-pay | Admitting: Internal Medicine

## 2019-11-21 ENCOUNTER — Other Ambulatory Visit: Payer: Self-pay | Admitting: Internal Medicine

## 2019-11-21 DIAGNOSIS — F0281 Dementia in other diseases classified elsewhere with behavioral disturbance: Secondary | ICD-10-CM

## 2019-11-21 DIAGNOSIS — F02818 Dementia in other diseases classified elsewhere, unspecified severity, with other behavioral disturbance: Secondary | ICD-10-CM

## 2019-11-21 NOTE — Telephone Encounter (Signed)
Pt has refills, but pharmacy is requesting 90-day supply.  Has follow up appt on 01/20/19  with pcp..  Please advise.[name], [name]12/11/202011:08 AM

## 2019-11-24 ENCOUNTER — Telehealth: Payer: Self-pay

## 2019-11-24 NOTE — Telephone Encounter (Signed)
Phone call placed to patient to offer to schedule a visit with Palliative Care. Phone rang, with no answer or no VM

## 2019-11-27 ENCOUNTER — Other Ambulatory Visit: Payer: Self-pay | Admitting: Internal Medicine

## 2019-11-27 NOTE — Telephone Encounter (Signed)
Refill Request for a 90 day supply if possible   midodrine (PROAMATINE) 5 MG tablet  [id number] - [place], [place] - [address]

## 2019-11-28 MED ORDER — MIDODRINE HCL 5 MG PO TABS: 5.0000 mg | ORAL_TABLET | Freq: Three times a day (TID) | ORAL | 0 refills | Status: DC

## 2019-11-29 ENCOUNTER — Other Ambulatory Visit: Payer: Self-pay | Admitting: Internal Medicine

## 2019-11-29 DIAGNOSIS — I5030 Unspecified diastolic (congestive) heart failure: Secondary | ICD-10-CM

## 2019-12-01 NOTE — Telephone Encounter (Signed)
Received refill request from pt's pharmacy for furosemide 20mg  tabs take one daily.  Per EMR, pt should be on 40mg  daily. Call made to pharmacy.  Request for 20mg  daily denied-pharmacy already had rx on file for 40mg  daily and will fill rx according.Marland KitchenMarland KitchenDespina Hidden Cassady12/21/202011:58 AM

## 2019-12-02 ENCOUNTER — Telehealth: Payer: Self-pay | Admitting: Internal Medicine

## 2019-12-02 NOTE — Telephone Encounter (Signed)
Spoke with patients son Ewen Varnell who states that [facility] is seeing patient for palliative care so we will discharge our referral through [place].

## 2019-12-02 NOTE — Telephone Encounter (Signed)
Phone call placed to patient to offer to schedule a visit with Palliative Care. Phone rang, with no answer and voicemail box was full.

## 2019-12-09 ENCOUNTER — Telehealth: Payer: Self-pay | Admitting: Internal Medicine

## 2019-12-09 DIAGNOSIS — J449 Chronic obstructive pulmonary disease, unspecified: Secondary | ICD-10-CM | POA: Diagnosis not present

## 2019-12-09 NOTE — Telephone Encounter (Signed)
Pt's son states that midodrine is 140.00 appr for 3 months worth of med, pt would like something cheaper. They state pt has a supp policy w/ [employer] but that the card has been lost. Its very possible that he does not have this supp policy. Called [employer] and confirmed price of med. Is there something more cost efficient?

## 2019-12-09 NOTE — Telephone Encounter (Signed)
Pt is returning a call; pls contact [phone]

## 2019-12-09 NOTE — Telephone Encounter (Signed)
Pt is son is calling regarding blood pressure, it cost is too expensive [phone]

## 2019-12-15 NOTE — Telephone Encounter (Signed)
Pt's son reports that pt is continuing not to take meds properly as in not taking. States pt is becoming more angry about meds, health, life in general. This is confirmed by [facility] RN julie, pt's son states he wants to increase citalopram to see if that helps. Triage RN spoke to son, reassured him and encouraged him to notify APS if needed he states he has 2x and they wont help.

## 2019-12-20 ENCOUNTER — Other Ambulatory Visit (HOSPITAL_COMMUNITY): Payer: Medicare Other

## 2019-12-20 DIAGNOSIS — J449 Chronic obstructive pulmonary disease, unspecified: Secondary | ICD-10-CM | POA: Diagnosis not present

## 2020-01-09 DIAGNOSIS — J449 Chronic obstructive pulmonary disease, unspecified: Secondary | ICD-10-CM | POA: Diagnosis not present

## 2020-01-20 ENCOUNTER — Telehealth: Payer: Self-pay | Admitting: *Deleted

## 2020-01-20 DIAGNOSIS — J449 Chronic obstructive pulmonary disease, unspecified: Secondary | ICD-10-CM | POA: Diagnosis not present

## 2020-01-21 ENCOUNTER — Encounter: Payer: Self-pay | Admitting: Internal Medicine

## 2020-01-21 ENCOUNTER — Ambulatory Visit (INDEPENDENT_AMBULATORY_CARE_PROVIDER_SITE_OTHER): Payer: Medicare Other | Admitting: Internal Medicine

## 2020-01-21 VITALS — BP 101/67 | HR 96 | Temp 98.2°F | Ht 70.0 in | Wt 207.4 lb

## 2020-01-21 DIAGNOSIS — Z7951 Long term (current) use of inhaled steroids: Secondary | ICD-10-CM

## 2020-01-21 DIAGNOSIS — Z9981 Dependence on supplemental oxygen: Secondary | ICD-10-CM | POA: Diagnosis not present

## 2020-01-21 DIAGNOSIS — J449 Chronic obstructive pulmonary disease, unspecified: Secondary | ICD-10-CM

## 2020-01-21 DIAGNOSIS — J9611 Chronic respiratory failure with hypoxia: Secondary | ICD-10-CM

## 2020-01-21 DIAGNOSIS — I110 Hypertensive heart disease with heart failure: Secondary | ICD-10-CM

## 2020-01-21 DIAGNOSIS — I10 Essential (primary) hypertension: Secondary | ICD-10-CM

## 2020-01-21 DIAGNOSIS — I5030 Unspecified diastolic (congestive) heart failure: Secondary | ICD-10-CM

## 2020-01-21 DIAGNOSIS — Z79899 Other long term (current) drug therapy: Secondary | ICD-10-CM

## 2020-01-21 MED ORDER — FUROSEMIDE 40 MG PO TABS: 40.0000 mg | ORAL_TABLET | Freq: Every day | ORAL | 6 refills | Status: DC

## 2020-01-21 NOTE — Assessment & Plan Note (Signed)
Patient is on 2L of supplemental oxygen at home. Has not been using his inhalers and reports no recent exacerbations. Discussed and recommenced use of maintenance inhaler, but he reports he feels okay without using it right now.  Discussed pulmonology referral for PFT's , patient is not interested at this time.  P: C/w Anoro 1 puff daily

## 2020-01-21 NOTE — Progress Notes (Signed)
   CC: COPD and heart failure   HPI:[name] is a 79 y.o. male who presents for evaluation of COPD and HFpEF. Please see individual problem based A/P for details.   Past Medical History:  Diagnosis Date  . COPD (chronic obstructive pulmonary disease) (HCC)   . Hypertension    Review of Systems:  ROS negative except as per HPI.  Physical Exam: Vitals:   01/21/20 1449  BP: 101/67  Pulse: 96  Temp: 98.2 F (36.8 C)  TempSrc: Oral  SpO2: 96%  Weight: 207 lb 6.4 oz (94.1 kg)  Height: 5\' 10"  (1.778 m)    General: Alert, nl appearance HEENT: Normocephalic, atraumatic , EOMI, Conjunctivae normal Cardiovascular: Normal rate, regular rhythm.  No murmurs, rubs, or gallops Pulmonary : Effort normal. No wheezes , rales,  rhonchi.  Abdominal: soft, nontender, bowel sounds present Musculoskeletal: Brawny stasis changes, only trace not pitting edema Skin: Warm, dry Neurological: alert and oriented x4 , no focal deficits  Psychiatric/Behavioral:  normal mood, normal behavior   Assessment & Plan:   See Encounters Tab for problem based charting.  Patient discussed with Dr. 

## 2020-01-21 NOTE — Assessment & Plan Note (Signed)
Normotensive , 101/67. Patient off of Midodrine. Will remove from medications. He denies any lightheadedness when standing or dizziness.

## 2020-01-21 NOTE — Patient Instructions (Signed)
Thank you for trusting me with your care. To recap, today we discussed the following:   Heart Failure - Continue taking Lasix 40 mg. You look to be doing well today.   COPD You have stopped using your inhalers, but we will keep them on your prescriptions.   Blood Pressure - BP is on the low end of normal, let me know if you have lightheadedness. You have stopped taking midodrine, so I will discontinue it from your medication list.

## 2020-01-21 NOTE — Assessment & Plan Note (Signed)
Comfortble on home 2L supplemental oxygen. Reports he has been taking 40 mg daily, Currently at 94.1 kg, which appears to be below estimated dry weight. On exam he appears euvolemic

## 2020-01-21 NOTE — Assessment & Plan Note (Addendum)
Patient currently on 2L supplemental oxygen. He is able to go without supplemental oxygen at some points during the day, but he becomes sob with activity.  - Continue home supplemental O2

## 2020-01-22 NOTE — Progress Notes (Signed)
Internal Medicine Clinic Attending  Case discussed with Dr. Steen at the time of the visit.  We reviewed the resident's history and exam and pertinent patient test results.  I agree with the assessment, diagnosis, and plan of care documented in the resident's note.  Grayling Schranz, M.D., Ph.D.  

## 2020-02-08 DIAGNOSIS — J449 Chronic obstructive pulmonary disease, unspecified: Secondary | ICD-10-CM | POA: Diagnosis not present

## 2020-02-10 ENCOUNTER — Telehealth: Payer: Self-pay | Admitting: *Deleted
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
## 2020-02-11 NOTE — Telephone Encounter (Signed)
I spoke to home health nurse, Aaron Daniels, this week.  She was at the [place] home on Monday and mentioned the social worker was there with her.  We discussed we both evaluated independently and came to a conclusion the dynamic between the son and Aaron Daniels is causing conflict.Aaron Daniels and I have discussed his medications. The son is healthcare power of attorney for the mother. AaronDaniels says this was done without his knowledge and he is not happy with his son. Aaron Daniels has not asked me to include his son in discussions of his healthcare. Aaron Daniels and Aaron Daniels have been married for approximately 60 years, and I feel this track record tells me a lot. I believe Aaron Daniels would benefit from a nursing facility because she needs a lot of care. The social worker is discussing this with the family.  Aaron Daniels is also working with Aaron Daniels on how to better care for his wife who is suffering from dementia.  For example he has been frustrated because she needs constant monitoring to keep her from walking out the door and this is dangerous because she can become lost. The nurse has told him to be on the look out if she has her purse or shoes on.  I expect it is very frustrating to see someone you have been married to 60 years suffering from dementia.  Aaron Daniels filled out paperwork for adult protective services and from my understanding they have been out to their home. I will not discuss AaronDaniels's healthcare with his son unless AaronDaniels ask me to.

## 2020-02-17 DIAGNOSIS — J449 Chronic obstructive pulmonary disease, unspecified: Secondary | ICD-10-CM | POA: Diagnosis not present

## 2020-03-08 DIAGNOSIS — J449 Chronic obstructive pulmonary disease, unspecified: Secondary | ICD-10-CM | POA: Diagnosis not present

## 2020-03-19 DIAGNOSIS — J449 Chronic obstructive pulmonary disease, unspecified: Secondary | ICD-10-CM | POA: Diagnosis not present

## 2020-03-23 ENCOUNTER — Telehealth: Payer: Self-pay | Admitting: Internal Medicine

## 2020-03-23 NOTE — Telephone Encounter (Signed)
Paged by RN to call patient's son.   Son reports he needs a order to have home oxygen removed from home. I told patient's son, Aaron Daniels, I will only speak to his father regarding decisions effecting his health. Aaron Daniels and [name] do not speak well of one another and this complicates Aaron Daniels's care. It has become the main focus of attention during visits. Aaron Daniels reports he is doing what his father instructed and needs an order to have home oxygen pick up. He also would like for me to prescribe a safe taper of Citalopram for his father. I reminded Aaron Daniels I can not speak about his fathers health and if his father has concerns he needs to call me. Aaron Daniels says call back in 5-10 minutes and he will have his father answer the phone.   I called Aaron Daniels and Aaron Daniels was put on the phone. He says he would like for all of his supplemental oxygen equipment to be picked up. Aaron Daniels has been referred to PFT's , but did not keep appointment with pulmonology. However, It is likely he could benefit from continued supplemental oxygen in setting of  COPD. Patient understands risk/beneiftis and I will respect his wishes. . He has not used any supplemental oxygen in 3-4 weeks and reports he is doing well. They have been given instructions to call company who provides home oxygen and discuss removal.  He also says he would like to stop taking his Citalopram. I discussed tapering the medication, but he said he would take what he has left and then stop.

## 2020-03-23 NOTE — Telephone Encounter (Signed)
As started call before called away, son called and spent 30+ mins talking about fear of father, for self and mother also that pt wants to stop meds and needs taper of citalopram and stop 02 at home. He was informed that he fears pt to call policeor go to courthouse and have invol. Commitment done. He wants to speak to md, spoke to dr [name], informed him son is unreasonable, curses and ask for advice and tells nurse why he cant follow advice. Dr [name] is calling

## 2020-03-23 NOTE — Telephone Encounter (Signed)
Pt's son calls and insist on keeping nurse on phone for

## 2020-03-23 NOTE — Telephone Encounter (Signed)
Pt is son is wanting a physician to call doctor Dr [name] [phone]  Pt is wanting the oxygen pickup

## 2020-04-06 ENCOUNTER — Other Ambulatory Visit: Payer: Self-pay | Admitting: Internal Medicine

## 2020-04-06 DIAGNOSIS — J9611 Chronic respiratory failure with hypoxia: Secondary | ICD-10-CM

## 2020-04-06 NOTE — Telephone Encounter (Signed)
Gilda at [facility] notified that order to d/c home oxygen is in Epic. She will contact patient's son to set up removal of oxygen equipment. Kinnie Feil, BSN, RN-BC

## 2020-04-06 NOTE — Telephone Encounter (Addendum)
Son called in again requesting all oxygen be removed from patient's home at patient's request. Son does not agree with this decision but he is respecting his father's wishes. Patient has also weaned himself off all meds including lasix, tamsulosin, and citalopram. Again, son does not agree with this but is respecting his father's wishes. Will forward to PCP for DME home oxygen order to d/c home oxygen. Once that is done, patient requests Gilda at [facility] be notified and given his cell number [phone]) to coordinate removal of oxygen as some of the equipment is the home and other is in storage. Kinnie Feil, BSN, RN-BC

## 2020-04-06 NOTE — Telephone Encounter (Signed)
D/c home oxygen order placed

## 2020-04-08 DIAGNOSIS — J449 Chronic obstructive pulmonary disease, unspecified: Secondary | ICD-10-CM | POA: Diagnosis not present

## 2020-04-16 ENCOUNTER — Telehealth: Payer: Self-pay

## 2020-04-16 ENCOUNTER — Ambulatory Visit: Payer: Medicare Other
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
## 2020-04-18 DIAGNOSIS — J449 Chronic obstructive pulmonary disease, unspecified: Secondary | ICD-10-CM | POA: Diagnosis not present

## 2020-04-19 NOTE — Telephone Encounter (Signed)
Dt steen, thought you may want to review
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
## 2020-04-21 ENCOUNTER — Encounter: Payer: Medicare Other | Admitting: Internal Medicine

## 2020-05-19 DIAGNOSIS — J449 Chronic obstructive pulmonary disease, unspecified: Secondary | ICD-10-CM | POA: Diagnosis not present

## 2020-06-17 ENCOUNTER — Telehealth: Payer: Self-pay | Admitting: Internal Medicine

## 2020-06-18 DIAGNOSIS — J449 Chronic obstructive pulmonary disease, unspecified: Secondary | ICD-10-CM | POA: Diagnosis not present

## 2020-06-21 ENCOUNTER — Telehealth: Payer: Self-pay | Admitting: *Deleted

## 2020-06-21 DIAGNOSIS — R45851 Suicidal ideations: Secondary | ICD-10-CM
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
## 2020-06-21 NOTE — Telephone Encounter (Signed)
Aaron Daniels son of patient called stating it is urgent that he speak with [name] about his Father. He would like to have APS go out to the home as he is concerned Father may hurt himself.

## 2020-06-21 NOTE — Telephone Encounter (Signed)
Please call Jensyn Cambria at [phone].

## 2020-06-23 ENCOUNTER — Ambulatory Visit: Payer: Self-pay | Admitting: Licensed Clinical Social Worker

## 2020-06-23 NOTE — Patient Instructions (Addendum)
Licensed Clinical Social Worker Visit Information   Materials Provided: No  06/23/2020  Name: Aaron Daniels     MRN: [id number]       DOB: 1941-07-03  Aaron Daniels is a 79 y.o. year old male who is a primary care patient of Aaron Ghee, MD. The CCM team was consulted for assistance with Level of Care Concerns.   Review of patient status, including review of consultants reports, other relevant assessments, and collaboration with appropriate care team members and the patient's provider was performed as part of comprehensive patient evaluation and provision of chronic care management services.    SDOH (Social Determinants of Health) assessments performed: No; risk of tobacco use  LCSW Aaron Daniels is covering for Aaron Daniels today. Aaron Krishawna Stiefel LCSW received a request from Aaron Mon RN for social work outreach call to Aaron Daniels regarding needs of client. Dr. Albertha Daniels is PCP for client.  Note has been made of possible dysfunction in relationship of Aaron Daniels, son, and of client. LCSW called client home phone several times today but could not connect via phone with client. LCSW did chart review and noted that client was brought to Aaron Daniels for involuntary commitment on 06/21/2020. Son, Aaron Daniels, had been concerned that client was a threat to himself and his family.  Son has also been coordinating with Sherriff and mobile crisis to seek involuntary commitment for client.LCSW talked with Aaron Daniels, son of client , today about client needs. Aaron Daniels said client has Alzheimer's dementia with behavioral issue.Aaron Daniels said that his mother is no longer living at home of client but is staying with Aaron Daniels at this time Aaron Daniels said he is HCPOA for his mother but not for client.Aaron Daniels said client is no longer driving. LCSW talked with Aaron Daniels about safety issues for client, Aaron Daniels, and mother of client.Aaron Daniels said for safety reasons he would not let his mother go back to home of  client. Aaron Daniels said client has not been compliant with his medications.Aaron Daniels said client is not changing clothes regularly. Aaron Daniels said client is not using proper hygiene.LCSW talked with Aaron Daniels about client possession of firearm.  Aaron Daniels said he has removed all ammunition from client home, removed ammunition from client pistol , removed ammunition from shotgun of client. Aaron Daniels said his mother is fearful of client. Aaron Daniels said he had talked with Sherriff about client threats to him and to his mother.Aaron Daniels has also talked with APS about client needs. Aaron Daniels said hearing for a 50 B was in process but is going to be delayed due to family emergency (funeral -Aaron Daniels. Aaron Daniels). Aaron Daniels said his mother also has appointment with Dr. Barbaraann Daniels on July 12, 2020.Aaron Daniels and LCSW spoke of safety awareness related to spouse of client. Aaron Daniels said he has removed items from home that could be a weapon for client. He said client is clever.Aaron Daniels said he prefers for LCSWs or other care providers to call him to talk and encouraged providers to call him until they reach him. Aaron Daniels said client is staying at home alone.   Follow Up Plan: LCSW informed Aaron Daniels that he would get a call in next Daniels weeks from social worker to talk more with Aaron Daniels about client needs.  The patient/Aaron Daniels, son of client, verbalized understanding of instructions provided today and declined a print copy of patient instruction materials.    Aaron Daniels.Aaron Daniels MSW, LCSW Licensed Clinical Social Worker [facility] Care Management [phone]
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
## 2020-06-30 ENCOUNTER — Telehealth: Payer: Self-pay | Admitting: Licensed Clinical Social Worker

## 2020-06-30 ENCOUNTER — Encounter: Payer: Self-pay | Admitting: Licensed Clinical Social Worker

## 2020-06-30 ENCOUNTER — Telehealth: Payer: Medicare Other

## 2020-06-30 NOTE — Telephone Encounter (Signed)
Patient was called to discuss a referral from his doctor. Patient did not answer, and the vm box was full. A letter will be mailed today discussing the referral.

## 2020-06-30 NOTE — Telephone Encounter (Signed)
Patient's son returned the call. Patient's son reported his father does not have a phone anymore, and he is the primary source of contact. Patient's son reported that he is no longer taking care of his father.

## 2020-07-20 NOTE — Telephone Encounter (Signed)
Opened in error

## 2020-07-21 ENCOUNTER — Ambulatory Visit: Payer: Medicare Other

## 2020-07-21 ENCOUNTER — Ambulatory Visit: Payer: Medicare Other | Admitting: *Deleted

## 2020-07-21 DIAGNOSIS — F0281 Dementia in other diseases classified elsewhere with behavioral disturbance: Secondary | ICD-10-CM

## 2020-07-21 DIAGNOSIS — I10 Essential (primary) hypertension: Secondary | ICD-10-CM

## 2020-07-21 DIAGNOSIS — I5030 Unspecified diastolic (congestive) heart failure: Secondary | ICD-10-CM

## 2020-07-21 NOTE — Patient Instructions (Signed)
Visit Information  Goals Addressed              This Visit's Progress   .  "Per son, he needs placement" (pt-stated)        Current Barriers:  Marland Kitchen Knowledge Barriers related to resources available to address level of care concerns/need for long term care/placement.  Patient's son, Iantha Fallen, reports that patient resided with his other son for approximately two weeks but had to return home because they were unable to manage his care.  Patient's spouse lives with Iantha Fallen due to continued concern for her safety.  Patient is residing alone.  Iantha Fallen reports that patient's other son takes food to him and attempts to assist with medication but patient often refuses to take it.  APS case has been closed due to "my mom being safe because she lives with me".  Family is still pursing [address] and [address] proceeding is scheduled for 07/29/20  Case Manager Clinical Goal(s):  Marland Kitchen Over the next 180 days, patient will work with [name] to address needs related to need for long term care/placement . Over the next 180 days, [name] will collaborate with RN Care Manager to address care management and care coordination needs  Interventions:  . Patient interviewed and appropriate assessments performed . Educated son about difference between care at mental health facilities versus memory care facilities . Educated son about process for obtaining long term placement . Informed son that Medicare does not cover cost of long term placement . Encouraged son to contact [facility] to discuss process for applying for Long Term Care Medicaid . Talked with son about becoming [facility] POA for patient but unsure that patient will agree to this . Asked if son feels that another APS report should be filed due to safety concerns for patient; son does not wish to file report at this time stating his moms safety is number one priority . Collaborated with RN Care Manager and patient to establish an individualized plan of care   Patient Self Care  Activities:  . Patient's son verbalizes understanding of plan to apply for Medicaid and work with [name] for placement process  Initial goal documentation        Patient's son verbalizes understanding of instructions provided today.   Telephone follow up appointment with care management team member scheduled for:08/04/20     Malachy Chamber, [name] Embedded Care Coordination Social Worker [facility] [phone]

## 2020-07-21 NOTE — Chronic Care Management (AMB) (Signed)
Chronic Care Management   Initial Visit Note  07/21/2020 Name: Aaron Daniels MRN: [id number] DOB: 1941/08/05  Referred by: Aaron Daniels Reason for referral : Chronic Care Management (HF, COPD, HTN, dementia)   Aaron Daniels is a 79 y.o. year old male who is a primary care patient of Aaron Daniels. The CCM team was consulted for assistance with chronic disease management and care coordination needs related to level of care concerns  Review of patient status, including review of consultants reports, relevant laboratory and other test results, and collaboration with appropriate care team members and the patient's provider was performed as part of comprehensive patient evaluation and provision of chronic care management services.    SDOH (Social Determinants of Health) assessments performed: No See Care Plan activities for detailed interventions related to SDOH     Medications: Outpatient Encounter Medications as of 07/21/2020  Medication Sig  . acetaminophen (TYLENOL) 325 MG tablet Take 650 mg by mouth every 6 (six) hours as needed for mild pain.  Marland Kitchen albuterol (PROVENTIL) (2.5 MG/3ML) 0.083% nebulizer solution Take 3 mLs (2.5 mg total) by nebulization every 6 (six) hours as needed for wheezing or shortness of breath.  Marland Kitchen albuterol (VENTOLIN HFA) 108 (90 Base) MCG/ACT inhaler Inhale 2 puffs into the lungs every 6 (six) hours as needed for wheezing or shortness of breath.   . furosemide (LASIX) 40 MG tablet Take 1 tablet (40 mg total) by mouth daily.  Marland Kitchen guaiFENesin-dextromethorphan (ROBITUSSIN DM) 100-10 MG/5ML syrup Take 10 mLs by mouth every 4 (four) hours as needed for cough.  . Multiple Vitamins-Minerals (MULTIVITAMIN WITH MINERALS) tablet Take 1 tablet by mouth daily.  Marland Kitchen umeclidinium-vilanterol (ANORO ELLIPTA) 62.5-25 MCG/INH AEPB Inhale 1 puff into the lungs daily.   No facility-administered encounter medications on file as of 07/21/2020.     Objective: n/a  Goals Addressed                This Visit's Progress     Patient Stated   .  Patient's son told [name] "my father is living alone and needs long term placement." (pt-stated)        CARE PLAN ENTRY (see longtitudinal plan of care for additional care plan information)   Current Barriers:  . Chronic Disease Management support, education, and care coordination needs related to CHF, HTN, COPD, and Dementia  Case Manager Clinical Goal(s):  Marland Kitchen Over the next  180 days, patient will work with [name] to address needs related to Level of care concerns in patient with CHF, HTN, COPD, and Dementia  Interventions:  . Collaborated with [name] to initiate plan of care to address needs related to Level of care concerns in patient with CHF, HTN, COPD, and Dementia  Patient Self Care Activities:  . unknown  Initial goal documentation         Aaron Daniels's son was given information about Chronic Care Management services today including:  1. CCM service includes personalized support from designated clinical staff supervised by his physician, including individualized plan of care and coordination with other care providers 2. 24/7 contact phone numbers for assistance for urgent and routine care needs. 3. Service will only be billed when office clinical staff spend 20 minutes or more in a month to coordinate care. 4. Only one practitioner may furnish and bill the service in a calendar month. 5. The patient may stop CCM services at any time (effective at the end of the month) by phone call to the office staff. 6. The  patient will be responsible for cost sharing (co-pay) of up to 20% of the service fee (after annual deductible is met).  Patient's son agreed to services and verbal consent obtained.   Plan:   The care management team will reach out to the patient again over the next 180 days.   Aaron Churn RN, CCM, [facility] RN Care Manager [phone]

## 2020-07-21 NOTE — Progress Notes (Signed)
Internal Medicine Clinic Resident ° °I have personally reviewed this encounter including the documentation in this note and/or discussed this patient with the care management provider. I will address any urgent items identified by the care management provider and will communicate my actions to the patient's PCP. I have reviewed the patient's CCM visit with my supervising attending, Dr Hoffman. ° °Vasili Lynnlee Revels, MD °07/21/2020 ° ° °

## 2020-07-21 NOTE — Patient Instructions (Signed)
Visit Information  Goals Addressed              This Visit's Progress     Patient Stated   .  Patient's son told [name] "my father is living alone and needs long term placement." (pt-stated)        CARE PLAN ENTRY (see longtitudinal plan of care for additional care plan information)   Current Barriers:  . Chronic Disease Management support, education, and care coordination needs related to CHF, HTN, COPD, and Dementia  Case Manager Clinical Goal(s):  Marland Kitchen Over the next  180 days, patient will work with [name] to address needs related to Level of care concerns in patient with CHF, HTN, COPD, and Dementia  Interventions:  . Collaborated with [name] to initiate plan of care to address needs related to Level of care concerns in patient with CHF, HTN, COPD, and Dementia  Patient Self Care Activities:  . unknown  Initial goal documentation        Mr. Mcnab's son  was given information about Chronic Care Management services today including:  1. CCM service includes personalized support from designated clinical staff supervised by his physician, including individualized plan of care and coordination with other care providers 2. 24/7 contact phone numbers for assistance for urgent and routine care needs. 3. Service will only be billed when office clinical staff spend 20 minutes or more in a month to coordinate care. 4. Only one practitioner may furnish and bill the service in a calendar month. 5. The patient may stop CCM services at any time (effective at the end of the month) by phone call to the office staff. 6. The patient will be responsible for cost sharing (co-pay) of up to 20% of the service fee (after annual deductible is met).  Patient's son  agreed to services and verbal consent obtained.   The patient's son  verbalized understanding of instructions provided today and declined a print copy of patient instruction materials.   The care management team will reach out to the patient again  over the next 180 days.   Kelli Churn RN, CCM, [facility] RN Care Manager [phone]

## 2020-07-21 NOTE — Chronic Care Management (AMB) (Addendum)
  Care Management   Follow Up Note   07/21/2020 Name: Burle Kwan MRN: [id number] DOB: 1941/11/23  Referred by: Albertha Ghee, MD Reason for referral : Care Coordination ([place] of Care Concerns)   Amr Sturtevant is a 79 y.o. year old male who is a primary care patient of Albertha Ghee, MD. The care management team was consulted for assistance with care management and care coordination needs.    Review of patient status, including review of consultants reports, relevant laboratory and other test results, and collaboration with appropriate care team members and the patient's provider was performed as part of comprehensive patient evaluation and provision of chronic care management services.    SDOH (Social Determinants of Health) assessments performed: No See Care Plan activities for detailed interventions related to [facility])     Advanced Directives: See Care Plan and Vynca application for related entries.   Goals Addressed              This Visit's Progress   .  "Per son, he needs placement" (pt-stated)        Current Barriers:  Marland Kitchen Knowledge Barriers related to resources available to address level of care concerns/need for long term care/placement.  Patient's son, Iantha Fallen, reports that patient resided with his other son for approximately two weeks but had to return home because they were unable to manage his care.  Patient's spouse lives  Iantha Fallen due to continued concern for her safety.  Patient is residing alone.  Iantha Fallen reports that patient's other son takes food to him and attempts to assist with medication but patient often refuses to take it.  APS case has been closed due to "my mom being safe because she lives with me".  Family is still pursing [address] and court proceeding is scheduled for 07/29/20  Case Manager Clinical Goal(s):  Marland Kitchen Over the next 180 days, patient will work with [name] to address needs related to need for long term care/placement . Over the next 180 days, [name] will collaborate  with RN Care Manager to address care management and care coordination needs  Interventions:  . Patient interviewed and appropriate assessments performed . Educated son about difference between care at mental health facilities versus memory care facilities . Educated son about process for obtaining long term placement . Informed son that Medicare does not cover cost of long term placement . Encouraged son to contact [facility] DSS to discuss process for applying for Long Term Care Medicaid . Talked with son about becoming [facility] POA for patient but unsure that patient will agree to this . Asked if son feels that another APS report should be filed due to safety concerns for patient; son does not wish to file report at this time stating his moms safety is number one priority . Collaborated with RN Care Manager and patient to establish an individualized plan of care    Patient Self Care Activities:  . Patient's son verbalizes understanding of plan to apply for Medicaid and work with [name] for placement process  Initial goal documentation         Telephone follow up appointment with care management team member scheduled for:08/04/20       Malachy Chamber, [name] Embedded Care Coordination Social Worker [facility] [phone]

## 2020-07-23 NOTE — Progress Notes (Signed)
Internal Medicine Clinic Resident  I have personally reviewed this encounter including the documentation in this note and/or discussed this patient with the care management provider. I will address any urgent items identified by the care management provider and will communicate my actions to the patient's PCP. I have reviewed the patient's CCM visit with my supervising attending, Dr Hoffman.  Vasili Aaliya Maultsby, MD 07/23/2020    

## 2020-08-03 DIAGNOSIS — E785 Hyperlipidemia, unspecified: Secondary | ICD-10-CM | POA: Diagnosis not present

## 2020-08-03 DIAGNOSIS — Z79899 Other long term (current) drug therapy: Secondary | ICD-10-CM | POA: Diagnosis not present

## 2020-08-03 DIAGNOSIS — Z9181 History of falling: Secondary | ICD-10-CM | POA: Diagnosis not present

## 2020-08-04 ENCOUNTER — Ambulatory Visit: Payer: Medicare Other

## 2020-08-04 NOTE — Chronic Care Management (AMB) (Signed)
  Chronic Care Management   Outreach Note  08/04/2020 Name: Aaron Daniels MRN: [id number] DOB: 1940-12-17  Referred by: Albertha Ghee, MD Reason for referral : Care Coordination (Caregiver resources/LOC)   Patient's son contacted today for scheduled follow up call.   Unable to speak at time of call and asked for a call back at 12:00 PM. Called patient back at requested time but he did not answer; unable to leave message due to mailbox being full  Follow Up Plan: Will request that Care Guide attempt to reach son for rescheduling of phone follow up.    Malachy Chamber, [name] Embedded Care Coordination Social Worker [facility] [phone]

## 2020-08-05 ENCOUNTER — Telehealth: Payer: Self-pay

## 2020-08-05 NOTE — Telephone Encounter (Signed)
°  Chronic Care Management   Outreach Note  08/05/2020 Name: Aaron Daniels MRN: [id number] DOB: 07/19/1941  Referred by: Albertha Ghee, MD Reason for referral : Care Coordination (LOC/Caregiver resources)  Received return call from patient's. Scheduled follow up phone appointment for 08/06/20 @ 11:00 AM      [name], [name] Embedded Care Coordination Social Worker [facility] [phone]

## 2020-08-06 ENCOUNTER — Ambulatory Visit: Payer: Medicare Other

## 2020-08-06 DIAGNOSIS — G300 Alzheimer's disease with early onset: Secondary | ICD-10-CM

## 2020-08-06 DIAGNOSIS — J449 Chronic obstructive pulmonary disease, unspecified: Secondary | ICD-10-CM

## 2020-08-06 DIAGNOSIS — I10 Essential (primary) hypertension: Secondary | ICD-10-CM

## 2020-08-06 DIAGNOSIS — F0281 Dementia in other diseases classified elsewhere with behavioral disturbance: Secondary | ICD-10-CM

## 2020-08-06 NOTE — Patient Instructions (Signed)
Visit Information  Goals Addressed              This Visit's Progress   .  COMPLETED: "Per son, he needs placement" (pt-stated)        Current Barriers:  Marland Kitchen Knowledge Barriers related to resources available to address level of care concerns/need for long term care/placement.  Patient's son, Iantha Fallen, reports that patient resided with his other son for approximately two weeks but had to return home because they were unable to manage his care.  Patient's spouse lives with Iantha Fallen due to continued concern for her safety.  Patient is residing alone.  Iantha Fallen reports that patient's other son takes food to him and attempts to assist with medication but patient often refuses to take it.  APS case has been closed due to "my mom being safe because she lives with me".  Family is still pursing [address] and court proceeding is scheduled for 07/29/20 . 08/06/20:  Per son, court proceeding took place and patient consented to signing 65 B which put into place for spouses protection.  Patient currently living with younger son  Case Manager Clinical Goal(s):  Marland Kitchen Over the next 180 days, patient will work with [name] to address needs related to need for long term care/placement . Over the next 180 days, [name] will collaborate with RN Care Manager to address care management and care coordination needs  Interventions:  . Reminded son of process for long term placement. . Reminded son that Medicare does not cover cost of long term placement and patient/family should apply for Medicaid if interested in pursing placement . Talked with son about process for obtaining guardianship, however, son does not wish to pursue this at this time   Patient Self Care Activities:  . Patient's son verbalizes understanding of plan to apply for Medicaid and work with [name] for placement process  Please see past updates related to this goal by clicking on the "Past Updates" button in the selected goal      .  COMPLETED: Patient's son told [name]  "my father is living alone and needs long term placement." (pt-stated)        CARE PLAN ENTRY (see longtitudinal plan of care for additional care plan information)   Current Barriers:  . Chronic Disease Management support, education, and care coordination needs related to CHF, HTN, COPD, and Dementia  Case Manager Clinical Goal(s):  Marland Kitchen Over the next  180 days, patient will work with [name] to address needs related to Level of care concerns in patient with CHF, HTN, COPD, and Dementia  Interventions:  . Reminded son of process for long term placement. . Reminded son that Medicare does not cover cost of long term placement and patient/family should apply for Medicaid if interested in pursing placement . Talked with son about process for obtaining guardianship, however, son does not wish to pursue this at this time  Patient Self Care Activities:  . unknown  Please see past updates related to this goal by clicking on the "Past Updates" button in the selected goal       Son does not wish to pursue guardianship and/or placement for patient.  No future outreaches scheduled at this time, however, son has been provided with contact information for CCM [name] and encouraged to call if additional needs arise    Contractor, [name] Embedded Care Coordination Social Worker [facility] [phone]

## 2020-08-06 NOTE — Chronic Care Management (AMB) (Signed)
Care Management   Follow Up Note   08/06/2020 Name: Aaron Daniels MRN: [id number] DOB: 1941/01/19  Referred by: Albertha Ghee, MD Reason for referral : Care Coordination (Caregiver resources/LOC)   Aaron Daniels is a 79 y.o. year old male who is a primary care patient of Albertha Ghee, MD. The care management team was consulted for assistance with care management and care coordination needs.    Review of patient status, including review of consultants reports, relevant laboratory and other test results, and collaboration with appropriate care team members and the patient's provider was performed as part of comprehensive patient evaluation and provision of chronic care management services.    SDOH (Social Determinants of Health) assessments performed: No See Care Plan activities for detailed interventions related to [facility])     Advanced Directives: See Care Plan and Vynca application for related entries.   Goals Addressed              This Visit's Progress   .  COMPLETED: "Per son, he needs placement" (pt-stated)        Current Barriers:  Marland Kitchen Knowledge Barriers related to resources available to address level of care concerns/need for long term care/placement.  Patient's son, Aaron Daniels, reports that patient resided with his other son for approximately two weeks but had to return home because they were unable to manage his care.  Patient's spouse lives with Aaron Daniels due to continued concern for her safety.  Patient is residing alone.  Aaron Daniels reports that patient's other son takes food to him and attempts to assist with medication but patient often refuses to take it.  APS case has been closed due to "my mom being safe because she lives with me".  Family is still pursing [address] and court proceeding is scheduled for 07/29/20 . 08/06/20:  Per son, court proceeding took place and patient consented to signing 37 B which put into place for spouses protection.  Patient currently living with younger son  Case  Manager Clinical Goal(s):  Marland Kitchen Over the next 180 days, patient will work with [name] to address needs related to need for long term care/placement . Over the next 180 days, [name] will collaborate with RN Care Manager to address care management and care coordination needs  Interventions:  . Reminded son of process for long term placement. . Reminded son that Medicare does not cover cost of long term placement and patient/family should apply for Medicaid if interested in pursing placement . Talked with son about process for obtaining guardianship, however, son does not wish to pursue this at this time   Patient Self Care Activities:  . Patient's son verbalizes understanding of plan to apply for Medicaid and work with [name] for placement process  Please see past updates related to this goal by clicking on the "Past Updates" button in the selected goal      .  COMPLETED: Patient's son told [name] "my father is living alone and needs long term placement." (pt-stated)        CARE PLAN ENTRY (see longtitudinal plan of care for additional care plan information)   Current Barriers:  . Chronic Disease Management support, education, and care coordination needs related to CHF, HTN, COPD, and Dementia  Case Manager Clinical Goal(s):  Marland Kitchen Over the next  180 days, patient will work with [name] to address needs related to Level of care concerns in patient with CHF, HTN, COPD, and Dementia  Interventions:  . Reminded son of process for long term placement. Marland Kitchen  Reminded son that Medicare does not cover cost of long term placement and patient/family should apply for Medicaid if interested in pursing placement . Talked with son about process for obtaining guardianship, however, son does not wish to pursue this at this time  Patient Self Care Activities:  . unknown  Please see past updates related to this goal by clicking on the "Past Updates" button in the selected goal         Son does not wish to pursue  guardianship and/or placement for patient.  No future outreaches scheduled at this time, however, son has been provided with contact information for CCM [name] and encouraged to call if additional needs arise      Contractor, [name] Embedded Care Coordination Social Worker [facility] [phone]

## 2020-08-09 NOTE — Progress Notes (Signed)
Internal Medicine Clinic Resident  I have personally reviewed this encounter including the documentation in this note and/or discussed this patient with the care management provider. I will address any urgent items identified by the care management provider and will communicate my actions to the patient's PCP. I have reviewed the patient's CCM visit with my supervising attending, Dr Hoffman.  Lurlene Ronda, MD 08/09/2020    

## 2020-08-11 ENCOUNTER — Ambulatory Visit: Payer: Medicare Other

## 2020-08-11 DIAGNOSIS — F0281 Dementia in other diseases classified elsewhere with behavioral disturbance: Secondary | ICD-10-CM

## 2020-08-11 DIAGNOSIS — G309 Alzheimer's disease, unspecified: Secondary | ICD-10-CM

## 2020-08-11 NOTE — Telephone Encounter (Signed)
Review meds 

## 2020-08-11 NOTE — Progress Notes (Signed)
Internal Medicine Clinic Resident  I have personally reviewed this encounter including the documentation in this note and/or discussed this patient with the care management provider. I will address any urgent items identified by the care management provider and will communicate my actions to the patient's PCP. I have reviewed the patient's CCM visit with my supervising attending, Dr Vincent.  Pragya Lofaso K Suzane Vanderweide, MD 08/11/2020   

## 2020-08-11 NOTE — Chronic Care Management (AMB) (Signed)
  Care Management   Social Work Note  08/11/2020 Name: Aaron Daniels MRN: [id number] DOB: [birth year]  Aaron Daniels is a 79 y.o. year old male who sees Albertha Ghee, MD for primary care.  Received call from patient's son inquiring about process to have patient evaluated by psychiatrist in an inpatient setting.  Discussed process for voluntary verses involuntary commitment.   Son states "we have been through this several times" and verbalized understanding of process for involuntary commitment if patient is not willing to be voluntarily evaluated.     Malachy Chamber, [name] Embedded Care Coordination Social Worker [facility] [phone]

## 2020-08-12 NOTE — Progress Notes (Signed)
Internal Medicine Clinic Attending  CCM services provided by the care management provider and their documentation were discussed with Dr. Agyei. We reviewed the pertinent findings, urgent action items addressed by the resident and non-urgent items to be addressed by the PCP.  I agree with the assessment, diagnosis, and plan of care documented in the CCM and resident's note.  Payal Stanforth Thomas Jonluke Cobbins, MD 08/12/2020  

## 2020-09-21 ENCOUNTER — Ambulatory Visit: Payer: Self-pay

## 2020-09-21 NOTE — Progress Notes (Signed)
Encounter opened in error     Dequann Vandervelden, [name] Embedded Care Coordination Social Worker [facility] [phone]                             

## 2020-10-19 ENCOUNTER — Ambulatory Visit: Payer: Medicare Other

## 2020-10-19 ENCOUNTER — Ambulatory Visit: Payer: Medicare Other | Admitting: *Deleted

## 2020-10-19 DIAGNOSIS — G309 Alzheimer's disease, unspecified: Secondary | ICD-10-CM

## 2020-10-19 DIAGNOSIS — J449 Chronic obstructive pulmonary disease, unspecified: Secondary | ICD-10-CM

## 2020-10-19 DIAGNOSIS — F0281 Dementia in other diseases classified elsewhere with behavioral disturbance: Secondary | ICD-10-CM

## 2020-10-19 DIAGNOSIS — I5030 Unspecified diastolic (congestive) heart failure: Secondary | ICD-10-CM

## 2020-10-19 DIAGNOSIS — I10 Essential (primary) hypertension: Secondary | ICD-10-CM

## 2020-10-19 NOTE — Progress Notes (Signed)
Attending attestation: I have reviewed and discussed this CCM encounter with Dr. Imogene Burn and approve of plan as documented. Charissa Bash, MD

## 2020-10-19 NOTE — Chronic Care Management (AMB) (Signed)
  Chronic Care Management   Follow Up Note   10/19/2020 Name: Aaron Daniels MRN: [id number] DOB: 05-Sep-1941  Referred by: Aaron Ghee, MD Reason for referral : Chronic Care Management ( HF, COPD, HTN, Alzheimer's dementia)   Aaron Daniels is a 79 y.o. year old male who is a primary care patient of Aaron Ghee, MD. The CCM team was consulted for assistance with chronic disease management and care coordination needs.    Review of patient status, including review of consultants reports, relevant laboratory and other test results, and collaboration with appropriate care team members and the patient's provider was performed as part of comprehensive patient evaluation and provision of chronic care management services.    SDOH (Social Determinants of Health) assessments performed: No See Care Plan activities for detailed interventions related to Aaron Daniels)     Outpatient Encounter Medications as of 10/19/2020  Medication Sig  . acetaminophen (TYLENOL) 325 MG tablet Take 650 mg by mouth every 6 (six) hours as needed for mild pain.  Marland Kitchen albuterol (PROVENTIL) (2.5 MG/3ML) 0.083% nebulizer solution Take 3 mLs (2.5 mg total) by nebulization every 6 (six) hours as needed for wheezing or shortness of breath.  Marland Kitchen albuterol (VENTOLIN HFA) 108 (90 Base) MCG/ACT inhaler Inhale 2 puffs into the lungs every 6 (six) hours as needed for wheezing or shortness of breath.   . furosemide (LASIX) 40 MG tablet Take 1 tablet (40 mg total) by mouth daily.  Marland Kitchen guaiFENesin-dextromethorphan (ROBITUSSIN DM) 100-10 MG/5ML syrup Take 10 mLs by mouth every 4 (four) hours as needed for cough.  . Multiple Vitamins-Minerals (MULTIVITAMIN WITH MINERALS) tablet Take 1 tablet by mouth daily.  Marland Kitchen umeclidinium-vilanterol (ANORO ELLIPTA) 62.5-25 MCG/INH AEPB Inhale 1 puff into the lungs daily.   No facility-administered encounter medications on file as of 10/19/2020.     Objective:  No results found for: CHOL, HDL, LDLCALC, LDLDIRECT, TRIG,  CHOLHDL  BP Readings from Last 3 Encounters:  01/21/20 101/67  10/30/19 118/70  10/27/19 94/65    Goals Addressed              This Visit's Progress     Patient Stated   .  Son told CCM [name] "[name] needs involuntary committment." (pt-stated)        CARE PLAN ENTRY (see longtitudinal plan of care for additional care plan information)   Current Barriers:  . Chronic Disease Management support, education, and care coordination needs related to CHF, HTN, COPD, and Dementia  Case Manager Clinical Goal(s):  Marland Kitchen Over the next 180 days, patient will work with [name] to address needs related to Level of care concerns in patient with CHF, HTN, COPD, and Dementia  Interventions:  . Collaborated with [name] to initiate plan of care to address needs related to Level of care concerns in patient with CHF, HTN, COPD, and Dementia  Patient Self Care Activities:  . Patient works with CCM and clinic providers to assist with long term placement and/or involuntary committment  Initial goal documentation         Plan:   The care management team will reach out to the patient again over the next 180 days.    Cranford Mon RN, CCM, CDCES CCM Clinic RN Care Manager [phone]

## 2020-10-19 NOTE — Progress Notes (Signed)
Internal Medicine Clinic Resident  I have personally reviewed this encounter including the documentation in this note and/or discussed this patient with the care management provider. I will address any urgent items identified by the care management provider and will communicate my actions to the patient's PCP. I have reviewed the patient's CCM visit with my supervising attending, Dr Williams.  Ilze Roselli Y Jermar Colter, MD 10/19/2020   

## 2020-10-19 NOTE — Progress Notes (Signed)
Internal Medicine Clinic Resident  I have personally reviewed this encounter including the documentation in this note and/or discussed this patient with the care management provider. I will address any urgent items identified by the care management provider and will communicate my actions to the patient's PCP. I have reviewed the patient's CCM visit with my supervising attending, Dr Mayford Knife.  Remo Lipps, MD 10/19/2020

## 2020-10-19 NOTE — Patient Instructions (Signed)
Visit Information  Goals Addressed              This Visit's Progress     Patient Stated   .  Son told CCM [name] "[name] needs involuntary committment." (pt-stated)        CARE PLAN ENTRY (see longtitudinal plan of care for additional care plan information)   Current Barriers:  . Chronic Disease Management support, education, and care coordination needs related to CHF, HTN, COPD, and Dementia  Case Manager Clinical Goal(s):  Marland Kitchen Over the next 180 days, patient will work with [name] to address needs related to Level of care concerns in patient with CHF, HTN, COPD, and Dementia  Interventions:  . Collaborated with [name] to initiate plan of care to address needs related to Level of care concerns in patient with CHF, HTN, COPD, and Dementia  Patient Self Care Activities:  . Patient works with CCM and clinic providers to assist with long term placement and/or involuntary committment  Initial goal documentation        The patient's son verbalized understanding of instructions provided today and declined a print copy of patient instruction materials.   The care management team will reach out to the patient again over the next 180 days.   Cranford Mon RN, CCM, CDCES CCM Clinic RN Care Manager [phone]

## 2020-10-19 NOTE — Progress Notes (Signed)
Attending attestation: I have reviewed and discussed this CCM encounter with Dr. Chen and approve of plan as documented. Alexandros Ewan, MD 

## 2020-10-19 NOTE — Chronic Care Management (AMB) (Signed)
Care Management   Follow Up Note   10/19/2020 Name: Rohil Lesch MRN: [id number] DOB: May 05, 1941  Referred by: Albertha Ghee, MD Reason for referral : Care Coordination (level of care concerns)   Matilde Markie is a 79 y.o. year old male who is a primary care patient of Albertha Ghee, MD. The care management team was consulted for assistance with care management and care coordination needs.    Review of patient status, including review of consultants reports, relevant laboratory and other test results, and collaboration with appropriate care team members and the patient's provider was performed as part of comprehensive patient evaluation and provision of chronic care management services.    SDOH (Social Determinants of Health) assessments performed: No See Care Plan activities for detailed interventions related to [facility])     Advanced Directives: See Care Plan and Vynca application for related entries.   Goals Addressed              This Visit's Progress   .  Per son, "He needs to be involuntarily committed" (pt-stated)        Current Barriers:  Marland Kitchen Knowledge Barriers related to resources and support available to address  Level of care concerns Patient's son, Iantha Fallen, contacted CCM [name] stating that patient needs to be "involuntarily committed" Patient previously lived with other son but is now living at [facility] because family  members are unable to manage his care. Son reported the following behaviors today: not taking medications, minimally eating, not bathing, verbal aggression, agitation, refusing to pay to stay at motel.  APS Caseworker, Ilean China, states that report has already been filed for patient and she intends to conduct visit today.  Per Ms. Eulah Pont, APS can offer assistance with long term placement.  If patient is deemed to have "capacity", the decision for long term placement is up to him.  If patient is deemed to not have "capacity", his son will be contacted and APS can  assist with placement process as well as further educate son about obtaining guardianship.  Case Manager Clinical Goal(s):  Marland Kitchen Over the next 180 days, patient will work with [name] to address needs related to Level of care concerns . Over the next 180 days, [name] will collaborate with RN Care Manager to address care management and care coordination needs  Interventions:  . Patient's son interviewed and appropriate assessments performed . Informed son that he can contact [facility] to initiate involuntary commitment process, however, unsure that patient will meet criteria for commitment based on information provided . Educated son about criteria for involuntary commitment . Talked with son at length about difference between evaluation/treatment of mental health disorders versus dementia . Informed son that CCM [name] intends to file report with Adult Protective Services . Educated son about supportive services that can be provided by APS . Highly encouraged son to also file a report to provide additional information  . Encouraged son to consider pursing guardianship of patient as discussed in the past . Filed report with [employer] APS/Caseworker, Ilean China . Provided Ms. Eulah Pont with CCM [name] contact information  . Obtained direct contact number for Ms. Murphy for further collaboration/follow up . Collaborated with RN Care Manager and patient to establish an individualized plan of care   Patient Self Care Activities:  . Son verbalizes understanding of plan for CCM [name] and him to file report with [facility] APS  Initial goal documentation         Awaiting follow  up from Caseworker with Adult Protective Services.      Malachy Chamber, [name] Embedded Care Coordination Social Worker [facility] [phone]

## 2020-10-19 NOTE — Patient Instructions (Signed)
Visit Information  Goals Addressed              This Visit's Progress   .  Per son, "He needs to be involuntarily committed" (pt-stated)        Current Barriers:  Marland Kitchen Knowledge Barriers related to resources and support available to address  Level of care concerns Patient's son, Iantha Fallen, contacted CCM [name] stating that patient needs to be "involuntarily committed" Patient previously lived with other son but is now living at [facility] because family  members are unable to manage his care. Son reported the following behaviors today: not taking medications, minimally eating, not bathing, verbal aggression, agitation, refusing to pay to stay at motel.  APS Caseworker, Ilean China, states that report has already been filed for patient and she intends to conduct visit today.  Per Ms. Eulah Pont, APS can offer assistance with long term placement.  If patient is deemed to have "capacity", the decision for long term placement is up to him.  If patient is deemed to not have "capacity", his son will be contacted and APS can assist with placement process as well as further educate son about obtaining guardianship.  Case Manager Clinical Goal(s):  Marland Kitchen Over the next 180 days, patient will work with [name] to address needs related to Level of care concerns . Over the next 180 days, [name] will collaborate with RN Care Manager to address care management and care coordination needs  Interventions:  . Patient's son interviewed and appropriate assessments performed . Informed son that he can contact [facility] to initiate involuntary commitment process, however, unsure that patient will meet criteria for commitment based on information provided . Educated son about criteria for involuntary commitment . Talked with son at length about difference between evaluation/treatment of mental health disorders versus dementia . Informed son that CCM [name] intends to file report with Adult Protective Services .  Educated son about supportive services that can be provided by APS . Highly encouraged son to also file a report to provide additional information  . Encouraged son to consider pursing guardianship of patient as discussed in the past . Filed report with [facility] APS/Caseworker, Ilean China . Provided Ms. Eulah Pont with CCM [name] contact information  . Obtained direct contact number for Ms. Murphy for further collaboration/follow up . Collaborated with RN Care Manager and patient to establish an individualized plan of care   Patient Self Care Activities:  . Son verbalizes understanding of plan for CCM [name] and him to file report with [facility] APS  Initial goal documentation        Patient's son verbalizes understanding of instructions provided today.   Awaiting follow up from Caseworker with Adult Protective Services.    Malachy Chamber, [name] Embedded Care Coordination Social Worker [facility] [phone]

## 2020-10-25 ENCOUNTER — Ambulatory Visit: Payer: Medicare Other

## 2020-10-25 DIAGNOSIS — J449 Chronic obstructive pulmonary disease, unspecified: Secondary | ICD-10-CM

## 2020-10-25 DIAGNOSIS — F0281 Dementia in other diseases classified elsewhere with behavioral disturbance: Secondary | ICD-10-CM

## 2020-10-25 DIAGNOSIS — I5032 Chronic diastolic (congestive) heart failure: Secondary | ICD-10-CM

## 2020-10-25 NOTE — Chronic Care Management (AMB) (Signed)
Chronic Care Management    Clinical Social Work Follow Up Note  10/25/2020 Name: Aaron Daniels MRN: [id number] DOB: 1941/09/27  Aaron Daniels is a 79 y.o. year old male who is a primary care patient of Albertha Ghee, MD. The CCM team was consulted for assistance with level of care concerns.   Review of patient status, including review of consultants reports, other relevant assessments, and collaboration with appropriate care team members and the patient's provider was performed as part of comprehensive patient evaluation and provision of chronic care management services.    SDOH (Social Determinants of Health) assessments performed: No    Outpatient Encounter Medications as of 10/25/2020  Medication Sig  . acetaminophen (TYLENOL) 325 MG tablet Take 650 mg by mouth every 6 (six) hours as needed for mild pain.  Marland Kitchen albuterol (PROVENTIL) (2.5 MG/3ML) 0.083% nebulizer solution Take 3 mLs (2.5 mg total) by nebulization every 6 (six) hours as needed for wheezing or shortness of breath.  Marland Kitchen albuterol (VENTOLIN HFA) 108 (90 Base) MCG/ACT inhaler Inhale 2 puffs into the lungs every 6 (six) hours as needed for wheezing or shortness of breath.   . furosemide (LASIX) 40 MG tablet Take 1 tablet (40 mg total) by mouth daily.  Marland Kitchen guaiFENesin-dextromethorphan (ROBITUSSIN DM) 100-10 MG/5ML syrup Take 10 mLs by mouth every 4 (four) hours as needed for cough.  . Multiple Vitamins-Minerals (MULTIVITAMIN WITH MINERALS) tablet Take 1 tablet by mouth daily.  Marland Kitchen umeclidinium-vilanterol (ANORO ELLIPTA) 62.5-25 MCG/INH AEPB Inhale 1 puff into the lungs daily.   No facility-administered encounter medications on file as of 10/25/2020.     Goals Addressed              This Visit's Progress   .  Per son, "He needs to be involuntarily committed" (pt-stated)        Current Barriers:  Marland Kitchen Knowledge Barriers related to resources and support available to address  Level of care concerns Patient's son, Iantha Fallen, contacted CCM  [name] stating that patient needs to be "involuntarily committed" Patient previously lived with other son but is now living at [facility] because family  members are unable to manage his care. Son reported the following behaviors today: not taking medications, minimally eating, not bathing, verbal aggression, agitation, refusing to pay to stay at motel.  APS Caseworker, Ilean China, states that report has already been filed for patient and she intends to conduct visit today.  Per Ms. Eulah Pont, APS can offer assistance with long term placement.  If patient is deemed to have "capacity", the decision for long term placement is up to him.  If patient is deemed to not have "capacity", his son will be contacted and APS can assist with placement process as well as further educate son about obtaining guardianship.  Case Manager Clinical Goal(s):  Marland Kitchen Over the next 180 days, patient will work with [name] to address needs related to Level of care concerns . Over the next 180 days, [name] will collaborate with RN Care Manager to address care management and care coordination needs  Interventions:  . Left message for Ilean China with Adult Protective Services requesting call back for update on report filed last week.  .  . Patient Self Care Activities:  . Son verbalizes understanding of plan for CCM [name] and him to file report with [facility] APS  Initial goal documentation         Follow Up Plan: Awaiting return call from APS.     [employer], [name] Embedded  Care Coordination Social Worker [place] [phone]

## 2020-10-25 NOTE — Patient Instructions (Signed)
Licensed Clinical Social Worker Visit Information  Goals we discussed today:  Goals Addressed              This Visit's Progress   .  Per son, "He needs to be involuntarily committed" (pt-stated)        Current Barriers:  Marland Kitchen Knowledge Barriers related to resources and support available to address  Level of care concerns Patient's son, Aaron Daniels, contacted CCM [name] stating that patient needs to be "involuntarily committed" Patient previously lived with other son but is now living at [facility] because family  members are unable to manage his care. Son reported the following behaviors today: not taking medications, minimally eating, not bathing, verbal aggression, agitation, refusing to pay to stay at motel.  APS Caseworker, Aaron Daniels, states that report has already been filed for patient and she intends to conduct visit today.  Per Ms. Eulah Pont, APS can offer assistance with long term placement.  If patient is deemed to have "capacity", the decision for long term placement is up to him.  If patient is deemed to not have "capacity", his son will be contacted and APS can assist with placement process as well as further educate son about obtaining guardianship.  Case Manager Clinical Goal(s):  Marland Kitchen Over the next 180 days, patient will work with [name] to address needs related to Level of care concerns . Over the next 180 days, [name] will collaborate with RN Care Manager to address care management and care coordination needs  Interventions:  . Left message for Aaron Daniels with Adult Protective Services requesting call back for update on report filed last week.  .  . Patient Self Care Activities:  . Son verbalizes understanding of plan for CCM [name] and him to file report with [facility] APS  Initial goal documentation          Follow Up Plan: Awaiting return call from APS.     Aaron Daniels, [name] Embedded Care Coordination Social Worker [facility] [phone]

## 2020-10-26 ENCOUNTER — Ambulatory Visit: Payer: Medicare Other

## 2020-10-26 DIAGNOSIS — I5032 Chronic diastolic (congestive) heart failure: Secondary | ICD-10-CM

## 2020-10-26 DIAGNOSIS — G309 Alzheimer's disease, unspecified: Secondary | ICD-10-CM

## 2020-10-26 DIAGNOSIS — F0281 Dementia in other diseases classified elsewhere with behavioral disturbance: Secondary | ICD-10-CM

## 2020-10-26 DIAGNOSIS — J449 Chronic obstructive pulmonary disease, unspecified: Secondary | ICD-10-CM

## 2020-10-26 NOTE — Chronic Care Management (AMB) (Signed)
Chronic Care Management    Clinical Social Work Follow Up Note  10/26/2020 Name: Aaron Daniels MRN: [id number] DOB: December 29, 1940  Aaron Daniels is a 79 y.o. year old male who is a primary care patient of Aaron Rob, MD. The CCM team was consulted for assistance with Level of Care Concerns.   Review of patient Daniels, including review of consultants reports, other relevant assessments, and collaboration with appropriate care team members and the patient's provider was performed as part of comprehensive patient evaluation and provision of chronic care management services.    SDOH (Social Determinants of Health) assessments performed: No    Outpatient Encounter Medications as of 10/26/2020  Medication Sig  . acetaminophen (TYLENOL) 325 MG tablet Take 650 mg by mouth every 6 (six) hours as needed for mild pain.  Marland Kitchen albuterol (PROVENTIL) (2.5 MG/3ML) 0.083% nebulizer solution Take 3 mLs (2.5 mg total) by nebulization every 6 (six) hours as needed for wheezing or shortness of breath.  Marland Kitchen albuterol (VENTOLIN HFA) 108 (90 Base) MCG/ACT inhaler Inhale 2 puffs into the lungs every 6 (six) hours as needed for wheezing or shortness of breath.   . furosemide (LASIX) 40 MG tablet Take 1 tablet (40 mg total) by mouth daily.  Marland Kitchen guaiFENesin-dextromethorphan (ROBITUSSIN DM) 100-10 MG/5ML syrup Take 10 mLs by mouth every 4 (four) hours as needed for cough.  . Multiple Vitamins-Minerals (MULTIVITAMIN WITH MINERALS) tablet Take 1 tablet by mouth daily.  Marland Kitchen umeclidinium-vilanterol (ANORO ELLIPTA) 62.5-25 MCG/INH AEPB Inhale 1 puff into the lungs daily.   No facility-administered encounter medications on file as of 10/26/2020.     Goals Addressed              This Visit's Progress   .  Per son, "He needs to be involuntarily committed" (pt-stated)        Current Barriers:  Marland Kitchen Knowledge Barriers related to resources and support available to address  Level of care concerns Patient's son, Aaron Daniels, contacted CCM  [name] stating that patient needs to be "involuntarily committed" Patient previously lived with other son but is now living at [facility] because family  members are unable to manage his care. Son reported the following behaviors today: not taking medications, minimally eating, not bathing, verbal aggression, agitation, refusing to pay to stay at motel.  APS Caseworker, Aaron Daniels, states that report has already been filed for patient and she intends to conduct visit today.  Per Ms. [name], APS can offer assistance with long term placement.  If patient is deemed to have "capacity", the decision for long term placement is up to him.  If patient is deemed to not have "capacity", his son will be contacted and APS can assist with placement process as well as further educate son about obtaining guardianship.  10/26/20:  Case Worker with Adult Protective Services reported the following:  home visit conducted and multiple evaluations (including Aaron Daniels evaluation) were completed.  Patient deemed to "have capacity" by APS and appears to have basic needs met.  Patient reported to [place] worker that he has sufficient amount of money to remain at [employer].  APS worker categorizes this as a "domestic civil case" and not one that will result in commitment of patient.  A follow up visit will be conducted and case will be closed if patient presentation has not changed.   Case Manager Clinical Goal(s):  Marland Kitchen Over the next 180 days, patient will work with [name] to address needs related to Level of care  concerns . Over the next 180 days, [name] will collaborate with RN Care Manager to address care management and care coordination needs  Interventions:  . Collaborated with  Aaron Daniels with Adult Protective Services to gather update on APS report filed.  (see current barriers for update) .  Marland Kitchen Patient Self Care Activities:  . Son verbalizes understanding of plan for CCM [name] and him to file report with [facility] APS  Initial goal documentation         Follow Up Plan: Will contact patient's son for follow up before the end of the week.       [name], [place] Coordination Social Worker [place] [phone]

## 2020-10-26 NOTE — Patient Instructions (Signed)
Licensed Clinical Social Worker Visit Information  Goals we discussed today:  Goals Addressed              This Visit's Progress   .  Per son, "He needs to be involuntarily committed" (pt-stated)        Current Barriers:  Marland Kitchen Knowledge Barriers related to resources and support available to address  Level of care concerns Patient's son, [name], contacted CCM [name] stating that patient needs to be "involuntarily committed" Patient previously lived with other son but is now living at [facility] because family  members are unable to manage his care. Son reported the following behaviors today: not taking medications, minimally eating, not bathing, verbal aggression, agitation, refusing to pay to stay at motel.  APS Caseworker, [name], states that report has already been filed for patient and she intends to conduct visit today.  Per Ms. [name], APS can offer assistance with long term placement.  If patient is deemed to have "capacity", the decision for long term placement is up to him.  If patient is deemed to not have "capacity", his son will be contacted and APS can assist with placement process as well as further educate son about obtaining guardianship.  10/26/20:  Case Worker with Adult Protective Services reported the following:  home visit conducted and multiple evaluations (including [place] Mental Status evaluation) were completed.  Patient deemed to "have capacity" by APS and appears to have basic needs met.  Patient reported to [place] worker that he has sufficient amount of money to remain at [facility].  APS worker categorizes this as a "domestic civil case" and not one that will result in commitment of patient.  A follow up visit will be conducted and case will be closed if patient presentation has not changed.   Case Manager Clinical Goal(s):  Marland Kitchen Over the next 180 days, patient will work with [name] to address needs related to Level of care concerns . Over the next 180 days, [name] will  collaborate with RN Care Manager to address care management and care coordination needs  Interventions:  . Collaborated with  [name] with Adult Protective Services to gather update on APS report filed.  (see current barriers for update) .  Marland Kitchen Patient Self Care Activities:  . Son verbalizes understanding of plan for CCM [name] and him to file report with [facility] APS  Initial goal documentation         Follow Up Plan: Will contact patient's son for follow up before the end of the week.       [name], [place] Coordination Social Worker [place] [phone]

## 2020-10-27 NOTE — Progress Notes (Signed)
Internal Medicine Clinic Resident  I have personally reviewed this encounter including the documentation in this note and/or discussed this patient with the care management provider. I will address any urgent items identified by the care management provider and will communicate my actions to the patient's PCP. I have reviewed the patient's CCM visit with my supervising attending, Dr Vincent.  Aaron Finamore, MD 10/27/2020  

## 2020-10-27 NOTE — Progress Notes (Signed)
Internal Medicine Clinic Resident  I have personally reviewed this encounter including the documentation in this note and/or discussed this patient with the care management provider. I will address any urgent items identified by the care management provider and will communicate my actions to the patient's PCP. I have reviewed the patient's CCM visit with my supervising attending, Dr Vincent.  Chicquita Mendel, MD 10/27/2020  

## 2020-10-27 NOTE — Progress Notes (Signed)
Internal Medicine Clinic Attending  CCM services provided by the care management provider and their documentation were discussed with Dr. Basaraba. We reviewed the pertinent findings, urgent action items addressed by the resident and non-urgent items to be addressed by the PCP.  I agree with the assessment, diagnosis, and plan of care documented in the CCM and resident's note.  Ethyl Vila Thomas Tywana Robotham, MD 10/27/2020  

## 2020-10-27 NOTE — Progress Notes (Signed)
Internal Medicine Clinic Attending  CCM services provided by the care management provider and their documentation were discussed with Dr. Basaraba. We reviewed the pertinent findings, urgent action items addressed by the resident and non-urgent items to be addressed by the PCP.  I agree with the assessment, diagnosis, and plan of care documented in the CCM and resident's note.  Roderic Lammert Thomas Roosevelt Eimers, MD 10/27/2020  

## 2020-10-28 ENCOUNTER — Telehealth: Payer: Self-pay

## 2020-10-28 ENCOUNTER — Ambulatory Visit: Payer: Medicare Other

## 2020-10-28 ENCOUNTER — Telehealth: Payer: Medicare Other

## 2020-10-28 DIAGNOSIS — J9611 Chronic respiratory failure with hypoxia: Secondary | ICD-10-CM

## 2020-10-28 DIAGNOSIS — I5032 Chronic diastolic (congestive) heart failure: Secondary | ICD-10-CM

## 2020-10-28 DIAGNOSIS — G309 Alzheimer's disease, unspecified: Secondary | ICD-10-CM

## 2020-10-28 DIAGNOSIS — F0281 Dementia in other diseases classified elsewhere with behavioral disturbance: Secondary | ICD-10-CM

## 2020-10-28 NOTE — Patient Instructions (Signed)
Licensed Clinical Social Worker Visit Information  Goals we discussed today:  Goals Addressed              This Visit's Progress   .  COMPLETED: Per son, "He needs to be involuntarily committed" (pt-stated)        Current Barriers:  Marland Kitchen Knowledge Barriers related to resources and support available to address  Level of care concerns Patient's son, [name], contacted CCM [name] stating that patient needs to be "involuntarily committed" Patient previously lived with other son but is now living at [facility] because family  members are unable to manage his care. Son reported the following behaviors today: not taking medications, minimally eating, not bathing, verbal aggression, agitation, refusing to pay to stay at motel.  APS Caseworker, [name], states that report has already been filed for patient and she intends to conduct visit today.  Per Ms. [name], APS can offer assistance with long term placement.  If patient is deemed to have "capacity", the decision for long term placement is up to him.  If patient is deemed to not have "capacity", his son will be contacted and APS can assist with placement process as well as further educate son about obtaining guardianship.  10/26/20:  Case Worker with Adult Protective Services reported the following:  home visit conducted and multiple evaluations (including [place] Mental Status evaluation) were completed.  Patient deemed to "have capacity" by APS and appears to have basic needs met.  Patient reported to [place] worker that he has sufficient amount of money to remain at [facility].  APS worker categorizes this as a "domestic civil case" and not one that will result in commitment of patient.  A follow up visit will be conducted and case will be closed if patient presentation has not changed.   Case Manager Clinical Goal(s):  Marland Kitchen Over the next 180 days, patient will work with [name] to address needs related to Level of care concerns . Over the next 180 days,  [name] will collaborate with RN Care Manager to address care management and care coordination needs  Interventions:  . Talked to patient's son who reports that APS case was closed due to patient being deemed as having capacity. . Reminded son of process for obtaining long term placement if patient decides he is willing to go to facility.   . Goal closed  . Patient Self Care Activities:  . Son verbalizes understanding of plan for CCM [name] and him to file report with [facility] APS      .  COMPLETED: Son told CCM [name] "[name] needs involuntary committment." (pt-stated)        CARE PLAN ENTRY (see longtitudinal plan of care for additional care plan information)   Current Barriers:  . Chronic Disease Management support, education, and care coordination needs related to CHF, HTN, COPD, and Dementia  Case Manager Clinical Goal(s):  Marland Kitchen Over the next 180 days, patient will work with [name] to address needs related to Level of care concerns in patient with CHF, HTN, COPD, and Dementia  Interventions:  . Talked to patient's son who reports that APS case was closed due to patient being deemed as having capacity. . Reminded son of process for obtaining long term placement if patient decides he is willing to go to facility.   . Goal closed  Patient Self Care Activities:  . Patient works with [place] and clinic providers to assist with long term placement and/or involuntary committment  Please see  past updates related to this goal by clicking on the "Past Updates" button in the selected goal         No further follow up at this time.    [name], [place] Coordination Social Worker [place] [phone]

## 2020-10-28 NOTE — Chronic Care Management (AMB) (Signed)
Chronic Care Management    Clinical Social Work Follow Up Note  10/28/2020 Name: Aaron Daniels MRN: [id number] DOB: [birth year]  Aaron Daniels is a 79 y.o. year old male who is a primary care patient of Aaron Rob, MD. The CCM team was consulted for assistance with Level of Care Concerns.   Review of patient status, including review of consultants reports, other relevant assessments, and collaboration with appropriate care team members and the patient's provider was performed as part of comprehensive patient evaluation and provision of chronic care management services.    SDOH (Social Determinants of Health) assessments performed: No    Outpatient Encounter Medications as of 10/28/2020  Medication Sig  . acetaminophen (TYLENOL) 325 MG tablet Take 650 mg by mouth every 6 (six) hours as needed for mild pain.  Marland Kitchen albuterol (PROVENTIL) (2.5 MG/3ML) 0.083% nebulizer solution Take 3 mLs (2.5 mg total) by nebulization every 6 (six) hours as needed for wheezing or shortness of breath.  Marland Kitchen albuterol (VENTOLIN HFA) 108 (90 Base) MCG/ACT inhaler Inhale 2 puffs into the lungs every 6 (six) hours as needed for wheezing or shortness of breath.   . furosemide (LASIX) 40 MG tablet Take 1 tablet (40 mg total) by mouth daily.  Marland Kitchen guaiFENesin-dextromethorphan (ROBITUSSIN DM) 100-10 MG/5ML syrup Take 10 mLs by mouth every 4 (four) hours as needed for cough.  . umeclidinium-vilanterol (ANORO ELLIPTA) 62.5-25 MCG/INH AEPB Inhale 1 puff into the lungs daily.   No facility-administered encounter medications on file as of 10/28/2020.     Goals Addressed              This Visit's Progress   .  COMPLETED: Per son, "He needs to be involuntarily committed" (pt-stated)        Current Barriers:  Marland Kitchen Knowledge Barriers related to resources and support available to address  Level of care concerns Patient's son, Aaron Daniels, contacted CCM [name] stating that patient needs to be "involuntarily committed" Patient previously lived  with other son but is now living at [facility] because family  members are unable to manage his care. Son reported the following behaviors today: not taking medications, minimally eating, not bathing, verbal aggression, agitation, refusing to pay to stay at motel.  APS Caseworker, Aaron Daniels, states that report has already been filed for patient and she intends to conduct visit today.  Per Ms. Aaron Daniels, APS can offer assistance with long term placement.  If patient is deemed to have "capacity", the decision for long term placement is up to him.  If patient is deemed to not have "capacity", his son will be contacted and APS can assist with placement process as well as further educate son about obtaining guardianship.  10/26/20:  Case Worker with Adult Protective Services reported the following:  home visit conducted and multiple evaluations (including [name] Mental Status evaluation) were completed.  Patient deemed to "have capacity" by APS and appears to have basic needs met.  Patient reported to Aaron Daniels worker that he has sufficient amount of money to remain at [facility] [facility].  APS worker categorizes this as a "domestic civil case" and not one that will result in commitment of patient.  A follow up visit will be conducted and case will be closed if patient presentation has not changed.   Case Manager Clinical Goal(s):  Marland Kitchen Over the next 180 days, patient will work with [name] to address needs related to Level of care concerns . Over the next 180 days, [name] will collaborate with RN Care  Manager to address care management and care coordination needs  Interventions:  . Talked to patient's son who reports that APS case was closed due to patient being deemed as having capacity. . Reminded son of process for obtaining long term placement if patient decides he is willing to go to facility.   . Goal closed  . Patient Self Care Activities:  . Son verbalizes understanding of plan for CCM [name] and him to file report  with [facility] APS      .  COMPLETED: Son told CCM [name] "[name] needs involuntary committment." (pt-stated)        CARE PLAN ENTRY (see longtitudinal plan of care for additional care plan information)   Current Barriers:  . Chronic Disease Management support, education, and care coordination needs related to CHF, HTN, COPD, and Dementia  Case Manager Clinical Goal(s):  Marland Kitchen Over the next 180 days, patient will work with [name] to address needs related to Level of care concerns in patient with CHF, HTN, COPD, and Dementia  Interventions:  . Talked to patient's son who reports that APS case was closed due to patient being deemed as having capacity. . Reminded son of process for obtaining long term placement if patient decides he is willing to go to facility.   . Goal closed  Patient Self Care Activities:  . Patient works with [name] and clinic providers to assist with long term placement and/or involuntary committment  Please see past updates related to this goal by clicking on the "Past Updates" button in the selected goal           No futher follow up at this time.    Aaron Daniels, [name] Coordination Social Worker [place] [phone]

## 2020-10-28 NOTE — Telephone Encounter (Signed)
  Chronic Care Management   Outreach Note  10/28/2020 Name: Aaron Daniels MRN: [id number] DOB: [birth year]  Referred by: Albertha Ghee, MD Reason for referral : Care Coordination (level of care concerns)  Attempted to follow up with son today via phone.  No answer and voicemail box was full.   Will attempt to reach again within the next 7-10 days.    Malachy Chamber, [name] Embedded Care Coordination Social Worker [facility] [phone]

## 2020-10-28 NOTE — Progress Notes (Signed)
Internal Medicine Clinic Resident  I have personally reviewed this encounter including the documentation in this note and/or discussed this patient with the care management provider. I will address any urgent items identified by the care management provider and will communicate my actions to the patient's PCP. I have reviewed the patient's CCM visit with my supervising attending, Dr Hoffman.  Vasili Lynnetta Tom, MD 10/28/2020   

## 2020-11-01 ENCOUNTER — Encounter: Payer: Self-pay | Admitting: *Deleted

## 2020-11-01 NOTE — Progress Notes (Signed)

## 2020-11-02 ENCOUNTER — Telehealth: Payer: Medicare Other

## 2020-11-08 NOTE — Progress Notes (Signed)
Things That May Be Affecting Your Health:  Alcohol  Hearing loss  Pain    Depression x Home Safety  Sexual Health   Diabetes  Lack of physical activity x Stress   Difficulty with daily activities  Loneliness  Tiredness   Drug use x Medicines  Tobacco use   Falls  Motor Vehicle Safety  Weight   Food choices  Oral Health x Other     YOUR PERSONALIZED HEALTH PLAN : 1. Schedule your next subsequent Medicare Wellness visit in one year 2. Attend all of your regular appointments to address your medical issues 3. Complete the preventative screenings and services   Annual Wellness Visit   Medicare Covered Preventative Screenings and Services  Services & Screenings Men and Women Who How Often Need? Date of Last Service Action  Abdominal Aortic Aneurysm Adults with AAA risk factors Once     Alcohol Misuse and Counseling All Adults Screening once a year if no alcohol misuse. Counseling up to 4 face to face sessions.     Bone Density Measurement  Adults at risk for osteoporosis Once every 2 yrs     Lipid Panel Z13.6 All adults without CV disease Once every 5 yrs     Colorectal Cancer   Stool sample or  Colonoscopy All adults 50 and older   Once every year  Every 10 years     Depression All Adults Once a year  Today   Diabetes Screening Blood glucose, post glucose load, or GTT Z13.1  All adults at risk  Pre-diabetics  Once per year  Twice per year     Diabetes  Self-Management Training All adults Diabetics 10 hrs first year; 2 hours subsequent years. Requires Copay     Glaucoma  Diabetics  Family history of glaucoma  African Americans 50 yrs +  Hispanic Americans 65 yrs + Annually - requires coppay     Hepatitis C Z72.89 or F19.20  High Risk for HCV  Born between 1945 and 1965  Annually  Once     HIV Z11.4 All adults based on risk  Annually btw ages 61 & 68 regardless of risk  Annually > 65 yrs if at increased risk     Lung Cancer Screening Asymptomatic adults  aged 43-77 with 30 pack yr history and current smoker OR quit within the last 15 yrs Annually Must have counseling and shared decision making documentation before first screen     Medical Nutrition Therapy Adults with   Diabetes  Renal disease  Kidney transplant within past 3 yrs 3 hours first year; 2 hours subsequent years     Obesity and Counseling All adults Screening once a year Counseling if BMI 30 or higher  Today   Tobacco Use Counseling Adults who use tobacco  Up to 8 visits in one year     Vaccines Z23  Hepatitis B  Influenza   Pneumonia  Adults   Once  Once every flu season  Two different vaccines separated by one year x    Next Annual Wellness Visit People with Medicare Every year  Today     Services & Screenings Women Who How Often Need  Date of Last Service Action  Mammogram  Z12.31 Women over 40 One baseline ages 37-39. Annually ager 40 yrs+     Pap tests All women Annually if high risk. Every 2 yrs for normal risk women     Screening for cervical cancer with   Pap (Z01.419 nl or Z01.411abnl) &  HPV Z11.51 Women aged 11 to 88 Once every 5 yrs     Screening pelvic and breast exams All women Annually if high risk. Every 2 yrs for normal risk women     Sexually Transmitted Diseases  Chlamydia  Gonorrhea  Syphilis All at risk adults Annually for non pregnant females at increased risk         [place] Men Who How Ofter Need  Date of Last Service Action  Prostate Cancer - DRE & PSA Men over 50 Annually.  DRE might require a copay.     Sexually Transmitted Diseases  Syphilis All at risk adults Annually for men at increased risk

## 2021-02-06 IMAGING — NM NM PULMONARY VENT & PERF
15 series · 15 of 15 positions shown · non-contrast
Comparison: 09/04/2019

CLINICAL DATA: Hypoxemia and respiratory failure.

EXAM:
NUCLEAR MEDICINE VENTILATION - PERFUSION LUNG SCAN
TECHNIQUE: Ventilation images were obtained in multiple projections using
inhaled aerosol Uc-AAm DTPA. Perfusion images were obtained in
multiple projections after intravenous injection of Uc-AAm MAA.
RADIOPHARMACEUTICALS:  39.3 mCi of Uc-AAm DTPA aerosol inhalation
and 1.6 mCi Nc55m MAA IV

[Series 1: ant/post perf · 4.14mm/px · 1 of 1 slices shown (1 of 2)]
[im 1/1]
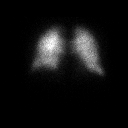

[Series 1: ant/post perf · 4.14mm/px · 1 of 1 slices shown (2 of 2)]
[im 1/1]
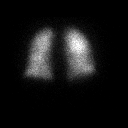

[Series 2: lao/rpo perf · 4.14mm/px · 1 of 1 slices shown (1 of 2)]
[im 1/1]
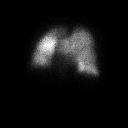

[Series 2: lao/rpo perf · 4.14mm/px · 1 of 1 slices shown (2 of 2)]
[im 1/1]
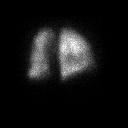

[Series 3: lpo/rao perf · 4.14mm/px · 1 of 1 slices shown (1 of 2)]
[im 1/1]
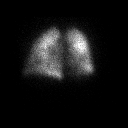

[Series 3: lpo/rao perf · 4.14mm/px · 1 of 1 slices shown (2 of 2)]
[im 1/1]
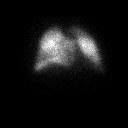

[Series 4: lt lat/rt lat perf · 4.14mm/px · 1 of 1 slices shown (1 of 2)]
[im 1/1]
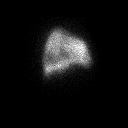

[Series 4: lt lat/rt lat perf · 4.14mm/px · 1 of 1 slices shown (2 of 2)]
[im 1/1]
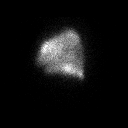

[Series 5: lt lat/rt lat vent · 4.14mm/px · 1 of 1 slices shown (1 of 2)]
[im 1/1]
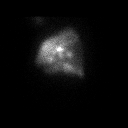

[Series 5: lt lat/rt lat vent · 4.14mm/px · 1 of 1 slices shown (2 of 2)]
[im 1/1]
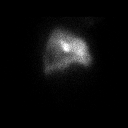

[Series 6: lpo/rao vent · 4.14mm/px · 1 of 1 slices shown (1 of 2)]
[im 1/1]
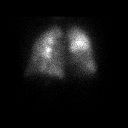

[Series 6: lpo/rao vent · 4.14mm/px · 1 of 1 slices shown (2 of 2)]
[im 1/1]
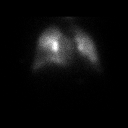

[Series 7: ant/post vent · 4.14mm/px · 1 of 1 slices shown]
[im 1/1]
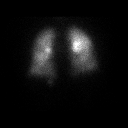

[Series 8: lao/rpo vent · 4.14mm/px · 1 of 1 slices shown (1 of 2)]
[im 1/1]
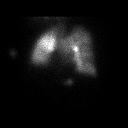

[Series 8: lao/rpo vent · 4.14mm/px · 1 of 1 slices shown (2 of 2)]
[im 1/1]
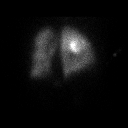

[15 of 15 positions shown; findings below may reference images not displayed]

FINDINGS: Ventilation: There is a large segmental ventilation defect involving
the superior segment of right lower lobe.

Perfusion: Corresponding to the ventilation abnormality is a large a
ventilation defect within the superior segment of right lower lobe.
No unmatched wedge shaped peripheral perfusion defects to suggest
acute pulmonary embolism. No corresponding chest radiograph
abnormality.
IMPRESSION: Low probability for acute pulmonary embolus.

## 2021-09-20 DIAGNOSIS — R55 Syncope and collapse: Secondary | ICD-10-CM | POA: Diagnosis not present

## 2021-09-20 DIAGNOSIS — S0001XA Abrasion of scalp, initial encounter: Secondary | ICD-10-CM | POA: Diagnosis not present

## 2021-09-20 DIAGNOSIS — R404 Transient alteration of awareness: Secondary | ICD-10-CM | POA: Diagnosis not present

## 2021-09-20 DIAGNOSIS — R61 Generalized hyperhidrosis: Secondary | ICD-10-CM | POA: Diagnosis not present

## 2021-09-20 DIAGNOSIS — R6889 Other general symptoms and signs: Secondary | ICD-10-CM | POA: Diagnosis not present

## 2021-09-20 DIAGNOSIS — I700 Atherosclerosis of aorta: Secondary | ICD-10-CM | POA: Diagnosis not present

## 2021-09-20 DIAGNOSIS — R0902 Hypoxemia: Secondary | ICD-10-CM | POA: Diagnosis not present

## 2021-09-20 DIAGNOSIS — Z743 Need for continuous supervision: Secondary | ICD-10-CM | POA: Diagnosis not present

## 2021-09-20 DIAGNOSIS — I10 Essential (primary) hypertension: Secondary | ICD-10-CM | POA: Diagnosis not present

## 2021-09-20 DIAGNOSIS — M47812 Spondylosis without myelopathy or radiculopathy, cervical region: Secondary | ICD-10-CM | POA: Diagnosis not present

## 2021-09-20 DIAGNOSIS — Z5900 Homelessness unspecified: Secondary | ICD-10-CM | POA: Diagnosis not present

## 2021-09-20 DIAGNOSIS — J439 Emphysema, unspecified: Secondary | ICD-10-CM | POA: Diagnosis not present

## 2021-09-20 DIAGNOSIS — M4802 Spinal stenosis, cervical region: Secondary | ICD-10-CM | POA: Diagnosis not present

## 2021-09-20 DIAGNOSIS — K449 Diaphragmatic hernia without obstruction or gangrene: Secondary | ICD-10-CM | POA: Diagnosis not present

## 2023-02-26 NOTE — Progress Notes (Unsigned)
Synopsis: Referred for COPD by No ref. provider found  Subjective:   PATIENT ID: Aaron Daniels GENDER: male DOB: 09-23-1941, MRN: [id number]  No chief complaint on file.  CC: DOE  81yM with hsitory of smoking 50+py, chart history of COPD, covid-19 pneumonia - hospitalized with it, AlzD, chronic diastolic heart failure  At last clinic visit with [place] 2020 referred to palliative care. Declined inhalers. Used prn albuterol neb. Completed DNR, most form.   He is currently not on any inhalers or nebulizers. He has not needed any courses of prednisone this year.   He does have some DOE. Not any worse relative to last tine he was here in clinic. Occasional cough.    Otherwise pertinent review of systems is negative.  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   Past Medical History:  Diagnosis Date   COPD (chronic obstructive pulmonary disease) ([place])    Hypertension      Family History  Problem Relation Age of Onset   Hypertension Mother    Diabetes Mother    CAD Mother    Colon cancer Father      No past surgical history on file.  Social History   Socioeconomic History   Marital status: Married    Spouse name: Not on file   Number of children: Not on file   Years of education: Not on file   Highest education level: Not on file  Occupational History   Not on file  Tobacco Use   Smoking status: Former    Packs/day: 1.25    Years: 56.00    Additional pack years: 0.00    Total pack years: 70.00    Types: Cigarettes    Quit date: 07/13/2012    Years since quitting: 10.6   Smokeless tobacco: Never  Vaping Use   Vaping Use: Never used  Substance and Sexual Activity   Alcohol use: Never   Drug use: Never   Sexual activity: Not on file  Other Topics Concern   Not on file  Social History Narrative   [facility] involved with patient situation.   Social Determinants of Health   Financial Resource Strain:  Unknown (10/08/2019)   Overall Financial Resource Strain (CARDIA)    Difficulty of Paying Living Expenses: Patient declined  Food Insecurity: Unknown (10/08/2019)   Hunger Vital Sign    Worried About Running Out of Food in the Last Year: Patient declined    [place] in the Last Year: Patient declined  Transportation Needs: Unknown (10/08/2019)   [place] - Hydrologist (Medical): Patient declined    Lack of Transportation (Non-Medical): Patient declined  Physical Activity: Unknown (10/08/2019)   Exercise Vital Sign    Days of Exercise per Week: Patient declined    Minutes of Exercise per Session: Patient declined  Stress: Unknown (10/08/2019)   [employer] [place] of Stress : Patient declined  Social Connections: Unknown (10/08/2019)   Social Connection and Isolation Panel [NHANES]    Frequency of Communication with Friends and Family: Patient declined    Frequency of Social Gatherings with Friends and Family: Patient declined    Attends Religious Services: Patient declined    Marine scientist or Organizations: Patient declined    Attends Archivist Meetings: Patient declined    Marital Status: Patient declined  Intimate Partner Violence: Unknown (  10/08/2019)   Humiliation, Afraid, Rape, and Kick questionnaire    Fear of Current or Ex-Partner: Patient declined    Emotionally Abused: Patient declined    Physically Abused: Patient declined    Sexually Abused: Patient declined     Allergies  Allergen Reactions   Codeine    Penicillins Itching    Did it involve swelling of the face/tongue/throat, SOB, or low BP? No Did it involve sudden or severe rash/hives, skin peeling, or any reaction on the inside of your mouth or nose?Yes Did you need to seek medical attention at a hospital or doctor's office? N/A When did it last happen?  N/A If all above answers are "NO",  may proceed with cephalosporin use.   Sulfa Antibiotics Rash     Outpatient Medications Prior to Visit  Medication Sig Dispense Refill   acetaminophen (TYLENOL) 325 MG tablet Take 650 mg by mouth every 6 (six) hours as needed for mild pain.     albuterol (PROVENTIL) (2.5 MG/3ML) 0.083% nebulizer solution Take 3 mLs (2.5 mg total) by nebulization every 6 (six) hours as needed for wheezing or shortness of breath. 75 mL 1   albuterol (VENTOLIN HFA) 108 (90 Base) MCG/ACT inhaler Inhale 2 puffs into the lungs every 6 (six) hours as needed for wheezing or shortness of breath.      furosemide (LASIX) 40 MG tablet Take 1 tablet (40 mg total) by mouth daily. 30 tablet 6   guaiFENesin-dextromethorphan (ROBITUSSIN DM) 100-10 MG/5ML syrup Take 10 mLs by mouth every 4 (four) hours as needed for cough. 118 mL 0   umeclidinium-vilanterol (ANORO ELLIPTA) 62.5-25 MCG/INH AEPB Inhale 1 puff into the lungs daily. 1 each 2   No facility-administered medications prior to visit.       Objective:   Physical Exam:  General appearance: 82 y.o., male, NAD, conversant  Eyes: anicteric sclerae; PERRL, tracking appropriately HENT: NCAT; MMM Neck: Trachea midline; no lymphadenopathy, no JVD Lungs: CTAB, no crackles, no wheeze, with normal respiratory effort CV: RRR, no murmur  Abdomen: Soft, non-tender; non-distended, BS present  Extremities: No peripheral edema, warm Skin: Normal turgor and texture; no rash Psych: Appropriate affect Neuro: Alert and oriented to person and place, no focal deficit     Vitals:   02/27/23 1317  BP: 130/84  Pulse: 91  Temp: 98.2 F (36.8 C)  TempSrc: Oral  SpO2: 95%  Weight: 186 lb (84.4 kg)  Height: 5\' 10"  (1.778 m)   95% on RA BMI Readings from Last 3 Encounters:  02/27/23 26.69 kg/m  01/21/20 29.76 kg/m  10/30/19 31.15 kg/m   Wt Readings from Last 3 Encounters:  02/27/23 186 lb (84.4 kg)  01/21/20 207 lb 6.4 oz (94.1 kg)  10/30/19 220 lb 3.2 oz (99.9 kg)      CBC    Component Value Date/Time   WBC 5.3 10/27/2019 1442   WBC 4.9 10/10/2019 0043   RBC 3.91 (L) 10/27/2019 1442   RBC 3.77 (L) 10/10/2019 0043   HGB 11.5 (L) 10/27/2019 1442   HCT 34.1 (L) 10/27/2019 1442   PLT 261 10/27/2019 1442   MCV 87 10/27/2019 1442   MCH 29.4 10/27/2019 1442   MCH 28.6 10/10/2019 0043   MCHC 33.7 10/27/2019 1442   MCHC 31.2 10/10/2019 0043   RDW 15.4 10/27/2019 1442   LYMPHSABS 1.5 10/27/2019 1442   MONOABS 0.4 10/10/2019 0043   EOSABS 0.1 10/27/2019 1442   BASOSABS 0.0 10/27/2019 1442    Chest Imaging: CTA Chest 09/21/2019  reviewed by me with emphysema, bronchial wall thickening, secretions in RMB, a few foci of scar/atelectasis  Pulmonary Functions Testing Results:     No data to display           Echocardiogram 09/2021:  Impaired LV relaxation pattern      Assessment & Plan:   # DOE # Emphysema # Chart history of COPD, gold functional group A  # History of smoking  Plan: - DOE mild enough that he doesn't want to bother with therapeutic trial maintenance inhaler and he's not an exacerbator   RTC as needed    Maryjane Hurter, MD [facility] 02/27/2023 1:22 PM

## 2023-02-27 ENCOUNTER — Encounter: Payer: Self-pay | Admitting: Student

## 2023-02-27 ENCOUNTER — Ambulatory Visit (INDEPENDENT_AMBULATORY_CARE_PROVIDER_SITE_OTHER): Payer: Medicare Other | Admitting: Student

## 2023-02-27 VITALS — BP 130/84 | HR 91 | Temp 98.2°F | Ht 70.0 in | Wt 186.0 lb

## 2023-02-27 DIAGNOSIS — R0609 Other forms of dyspnea: Secondary | ICD-10-CM

## 2023-02-27 DIAGNOSIS — J449 Chronic obstructive pulmonary disease, unspecified: Secondary | ICD-10-CM

## 2023-09-11 DEATH — deceased
